# Patient Record
Sex: Female | Born: 1970 | Race: Black or African American | Hispanic: No | Marital: Single | Smoking: Never smoker
Health system: Southern US, Community
[De-identification: ages and names within clinical notes are randomized; demographics above are authoritative.]

## PROBLEM LIST (undated history)

## (undated) DIAGNOSIS — R519 Headache, unspecified: Secondary | ICD-10-CM

## (undated) DIAGNOSIS — D649 Anemia, unspecified: Secondary | ICD-10-CM

## (undated) DIAGNOSIS — E119 Type 2 diabetes mellitus without complications: Secondary | ICD-10-CM

## (undated) DIAGNOSIS — D631 Anemia in chronic kidney disease: Secondary | ICD-10-CM

## (undated) DIAGNOSIS — E785 Hyperlipidemia, unspecified: Secondary | ICD-10-CM

## (undated) DIAGNOSIS — N189 Chronic kidney disease, unspecified: Secondary | ICD-10-CM

## (undated) DIAGNOSIS — Z5189 Encounter for other specified aftercare: Secondary | ICD-10-CM

## (undated) DIAGNOSIS — I10 Essential (primary) hypertension: Secondary | ICD-10-CM

## (undated) DIAGNOSIS — K219 Gastro-esophageal reflux disease without esophagitis: Secondary | ICD-10-CM

## (undated) DIAGNOSIS — M542 Cervicalgia: Secondary | ICD-10-CM

## (undated) DIAGNOSIS — N939 Abnormal uterine and vaginal bleeding, unspecified: Secondary | ICD-10-CM

## (undated) DIAGNOSIS — R51 Headache: Secondary | ICD-10-CM

## (undated) HISTORY — DX: Headache, unspecified: R51.9

## (undated) HISTORY — PX: OTHER SURGICAL HISTORY: SHX169

## (undated) HISTORY — PX: HERNIA REPAIR: SHX51

## (undated) HISTORY — PX: APPENDECTOMY: SHX54

## (undated) HISTORY — DX: Encounter for other specified aftercare: Z51.89

## (undated) HISTORY — DX: Essential (primary) hypertension: I10

## (undated) HISTORY — DX: Headache: R51

## (undated) HISTORY — DX: Type 2 diabetes mellitus without complications: E11.9

## (undated) HISTORY — DX: Hyperlipidemia, unspecified: E78.5

## (undated) HISTORY — DX: Gastro-esophageal reflux disease without esophagitis: K21.9

## (undated) HISTORY — PX: CARDIAC CATHETERIZATION: SHX172

## (undated) HISTORY — DX: Cervicalgia: M54.2

## (undated) HISTORY — PX: CHOLECYSTECTOMY: SHX55

---

## 1998-03-20 ENCOUNTER — Encounter: Admission: RE | Admit: 1998-03-20 | Discharge: 1998-06-18 | Payer: Self-pay | Admitting: *Deleted

## 1998-05-07 ENCOUNTER — Emergency Department (HOSPITAL_COMMUNITY): Admission: EM | Admit: 1998-05-07 | Discharge: 1998-05-07 | Payer: Self-pay | Admitting: Emergency Medicine

## 1998-05-18 ENCOUNTER — Encounter: Payer: Self-pay | Admitting: Emergency Medicine

## 1998-05-18 ENCOUNTER — Emergency Department (HOSPITAL_COMMUNITY): Admission: EM | Admit: 1998-05-18 | Discharge: 1998-05-18 | Payer: Self-pay | Admitting: Emergency Medicine

## 1998-12-24 ENCOUNTER — Emergency Department (HOSPITAL_COMMUNITY): Admission: EM | Admit: 1998-12-24 | Discharge: 1998-12-24 | Payer: Self-pay | Admitting: Emergency Medicine

## 1999-05-04 ENCOUNTER — Emergency Department (HOSPITAL_COMMUNITY): Admission: EM | Admit: 1999-05-04 | Discharge: 1999-05-04 | Payer: Self-pay | Admitting: *Deleted

## 1999-05-26 ENCOUNTER — Emergency Department (HOSPITAL_COMMUNITY): Admission: EM | Admit: 1999-05-26 | Discharge: 1999-05-26 | Payer: BC Managed Care – PPO | Admitting: Emergency Medicine

## 1999-10-12 ENCOUNTER — Encounter: Admission: RE | Admit: 1999-10-12 | Discharge: 2000-01-10 | Payer: Self-pay

## 1999-10-13 ENCOUNTER — Other Ambulatory Visit: Admission: RE | Admit: 1999-10-13 | Discharge: 1999-10-13 | Payer: Self-pay | Admitting: Family Medicine

## 2000-07-27 ENCOUNTER — Encounter: Admission: RE | Admit: 2000-07-27 | Discharge: 2000-07-27 | Payer: Self-pay | Admitting: Family Medicine

## 2000-07-27 ENCOUNTER — Encounter: Payer: Self-pay | Admitting: Family Medicine

## 2001-04-17 ENCOUNTER — Other Ambulatory Visit: Admission: RE | Admit: 2001-04-17 | Discharge: 2001-04-17 | Payer: Self-pay | Admitting: Family Medicine

## 2001-06-28 ENCOUNTER — Emergency Department (HOSPITAL_COMMUNITY): Admission: EM | Admit: 2001-06-28 | Discharge: 2001-06-28 | Payer: Self-pay | Admitting: Emergency Medicine

## 2001-06-28 ENCOUNTER — Observation Stay (HOSPITAL_COMMUNITY): Admission: EM | Admit: 2001-06-28 | Discharge: 2001-06-29 | Payer: Self-pay | Admitting: Family Medicine

## 2001-06-28 ENCOUNTER — Encounter: Payer: Self-pay | Admitting: Emergency Medicine

## 2002-07-04 ENCOUNTER — Other Ambulatory Visit: Admission: RE | Admit: 2002-07-04 | Discharge: 2002-07-04 | Payer: Self-pay | Admitting: Family Medicine

## 2003-05-03 ENCOUNTER — Inpatient Hospital Stay (HOSPITAL_COMMUNITY): Admission: AD | Admit: 2003-05-03 | Discharge: 2003-05-03 | Payer: Self-pay | Admitting: Family Medicine

## 2003-05-18 ENCOUNTER — Emergency Department (HOSPITAL_COMMUNITY): Admission: EM | Admit: 2003-05-18 | Discharge: 2003-05-18 | Payer: Self-pay | Admitting: Emergency Medicine

## 2003-06-05 ENCOUNTER — Encounter: Admission: RE | Admit: 2003-06-05 | Discharge: 2003-06-05 | Payer: Self-pay | Admitting: Obstetrics and Gynecology

## 2004-01-16 ENCOUNTER — Emergency Department (HOSPITAL_COMMUNITY): Admission: EM | Admit: 2004-01-16 | Discharge: 2004-01-17 | Payer: Self-pay | Admitting: Emergency Medicine

## 2004-05-20 ENCOUNTER — Ambulatory Visit: Payer: Self-pay | Admitting: Family Medicine

## 2004-09-29 ENCOUNTER — Emergency Department (HOSPITAL_COMMUNITY): Admission: EM | Admit: 2004-09-29 | Discharge: 2004-09-29 | Payer: Self-pay | Admitting: Emergency Medicine

## 2004-10-02 ENCOUNTER — Inpatient Hospital Stay (HOSPITAL_COMMUNITY): Admission: AD | Admit: 2004-10-02 | Discharge: 2004-10-02 | Payer: Self-pay | Admitting: Obstetrics and Gynecology

## 2004-10-13 ENCOUNTER — Other Ambulatory Visit: Admission: RE | Admit: 2004-10-13 | Discharge: 2004-10-13 | Payer: Self-pay | Admitting: Family Medicine

## 2005-03-02 ENCOUNTER — Emergency Department (HOSPITAL_COMMUNITY): Admission: EM | Admit: 2005-03-02 | Discharge: 2005-03-02 | Payer: Self-pay | Admitting: Emergency Medicine

## 2005-05-02 ENCOUNTER — Emergency Department (HOSPITAL_COMMUNITY): Admission: EM | Admit: 2005-05-02 | Discharge: 2005-05-02 | Payer: Self-pay | Admitting: Emergency Medicine

## 2005-06-23 ENCOUNTER — Ambulatory Visit: Payer: Self-pay | Admitting: Family Medicine

## 2005-06-24 ENCOUNTER — Ambulatory Visit (HOSPITAL_COMMUNITY): Admission: RE | Admit: 2005-06-24 | Discharge: 2005-06-24 | Payer: Self-pay | Admitting: *Deleted

## 2005-07-19 ENCOUNTER — Encounter (INDEPENDENT_AMBULATORY_CARE_PROVIDER_SITE_OTHER): Payer: Self-pay | Admitting: Specialist

## 2005-07-19 ENCOUNTER — Other Ambulatory Visit: Admission: RE | Admit: 2005-07-19 | Discharge: 2005-07-19 | Payer: Self-pay | Admitting: Obstetrics & Gynecology

## 2005-07-19 ENCOUNTER — Ambulatory Visit: Payer: Self-pay | Admitting: Family Medicine

## 2005-08-17 ENCOUNTER — Ambulatory Visit: Payer: Self-pay | Admitting: Family Medicine

## 2006-03-16 ENCOUNTER — Other Ambulatory Visit: Admission: RE | Admit: 2006-03-16 | Discharge: 2006-03-16 | Payer: Self-pay | Admitting: Family Medicine

## 2006-09-14 ENCOUNTER — Emergency Department (HOSPITAL_COMMUNITY): Admission: EM | Admit: 2006-09-14 | Discharge: 2006-09-14 | Payer: Self-pay | Admitting: Emergency Medicine

## 2006-11-07 ENCOUNTER — Inpatient Hospital Stay (HOSPITAL_COMMUNITY): Admission: AD | Admit: 2006-11-07 | Discharge: 2006-11-07 | Payer: Self-pay | Admitting: Obstetrics & Gynecology

## 2006-11-10 ENCOUNTER — Encounter: Admission: RE | Admit: 2006-11-10 | Discharge: 2006-11-10 | Payer: Self-pay | Admitting: Family Medicine

## 2006-11-23 ENCOUNTER — Encounter: Admission: RE | Admit: 2006-11-23 | Discharge: 2006-11-23 | Payer: Self-pay | Admitting: Family Medicine

## 2007-07-29 ENCOUNTER — Emergency Department (HOSPITAL_COMMUNITY): Admission: EM | Admit: 2007-07-29 | Discharge: 2007-07-29 | Payer: Self-pay | Admitting: Emergency Medicine

## 2007-08-03 ENCOUNTER — Encounter: Admission: RE | Admit: 2007-08-03 | Discharge: 2007-08-03 | Payer: Self-pay | Admitting: Family Medicine

## 2008-02-29 ENCOUNTER — Emergency Department (HOSPITAL_BASED_OUTPATIENT_CLINIC_OR_DEPARTMENT_OTHER): Admission: EM | Admit: 2008-02-29 | Discharge: 2008-03-01 | Payer: Self-pay | Admitting: Emergency Medicine

## 2008-03-18 ENCOUNTER — Other Ambulatory Visit: Admission: RE | Admit: 2008-03-18 | Discharge: 2008-03-18 | Payer: Self-pay | Admitting: Family Medicine

## 2008-04-14 ENCOUNTER — Emergency Department (HOSPITAL_COMMUNITY): Admission: EM | Admit: 2008-04-14 | Discharge: 2008-04-14 | Payer: Self-pay | Admitting: Family Medicine

## 2008-05-18 ENCOUNTER — Ambulatory Visit: Payer: Self-pay | Admitting: Interventional Radiology

## 2008-05-18 ENCOUNTER — Emergency Department (HOSPITAL_BASED_OUTPATIENT_CLINIC_OR_DEPARTMENT_OTHER): Admission: EM | Admit: 2008-05-18 | Discharge: 2008-05-18 | Payer: Self-pay | Admitting: Emergency Medicine

## 2008-09-18 ENCOUNTER — Ambulatory Visit: Payer: Self-pay | Admitting: Diagnostic Radiology

## 2008-09-18 ENCOUNTER — Emergency Department (HOSPITAL_BASED_OUTPATIENT_CLINIC_OR_DEPARTMENT_OTHER): Admission: EM | Admit: 2008-09-18 | Discharge: 2008-09-18 | Payer: Self-pay | Admitting: Emergency Medicine

## 2009-04-20 ENCOUNTER — Emergency Department (HOSPITAL_BASED_OUTPATIENT_CLINIC_OR_DEPARTMENT_OTHER): Admission: EM | Admit: 2009-04-20 | Discharge: 2009-04-20 | Payer: Self-pay | Admitting: Emergency Medicine

## 2009-08-04 ENCOUNTER — Emergency Department (HOSPITAL_BASED_OUTPATIENT_CLINIC_OR_DEPARTMENT_OTHER): Admission: EM | Admit: 2009-08-04 | Discharge: 2009-08-05 | Payer: Self-pay | Admitting: Emergency Medicine

## 2009-08-05 ENCOUNTER — Ambulatory Visit: Payer: Self-pay | Admitting: Diagnostic Radiology

## 2010-03-12 ENCOUNTER — Emergency Department (HOSPITAL_COMMUNITY): Admission: EM | Admit: 2010-03-12 | Discharge: 2010-03-12 | Payer: Self-pay | Admitting: Emergency Medicine

## 2010-03-23 ENCOUNTER — Encounter: Admission: RE | Admit: 2010-03-23 | Discharge: 2010-03-23 | Payer: Self-pay | Admitting: Emergency Medicine

## 2010-04-08 ENCOUNTER — Emergency Department (HOSPITAL_BASED_OUTPATIENT_CLINIC_OR_DEPARTMENT_OTHER): Admission: EM | Admit: 2010-04-08 | Discharge: 2010-04-09 | Payer: Self-pay | Admitting: Emergency Medicine

## 2010-04-10 ENCOUNTER — Inpatient Hospital Stay (HOSPITAL_COMMUNITY): Admission: EM | Admit: 2010-04-10 | Discharge: 2010-04-14 | Payer: Self-pay | Admitting: Family Medicine

## 2010-04-10 ENCOUNTER — Encounter: Payer: Self-pay | Admitting: Emergency Medicine

## 2010-04-10 ENCOUNTER — Ambulatory Visit: Payer: Self-pay | Admitting: Diagnostic Radiology

## 2010-04-26 ENCOUNTER — Encounter: Payer: Self-pay | Admitting: Family Medicine

## 2010-04-28 LAB — CONVERTED CEMR LAB: Pap Smear: NORMAL

## 2010-05-03 ENCOUNTER — Ambulatory Visit: Payer: Self-pay | Admitting: Family Medicine

## 2010-05-03 DIAGNOSIS — K869 Disease of pancreas, unspecified: Secondary | ICD-10-CM | POA: Insufficient documentation

## 2010-05-03 DIAGNOSIS — Z90411 Acquired partial absence of pancreas: Secondary | ICD-10-CM | POA: Insufficient documentation

## 2010-05-03 DIAGNOSIS — F329 Major depressive disorder, single episode, unspecified: Secondary | ICD-10-CM

## 2010-05-03 DIAGNOSIS — F3289 Other specified depressive episodes: Secondary | ICD-10-CM | POA: Insufficient documentation

## 2010-05-03 DIAGNOSIS — Q8909 Congenital malformations of spleen: Secondary | ICD-10-CM | POA: Insufficient documentation

## 2010-05-10 ENCOUNTER — Telehealth (INDEPENDENT_AMBULATORY_CARE_PROVIDER_SITE_OTHER): Payer: Self-pay | Admitting: *Deleted

## 2010-05-13 ENCOUNTER — Encounter: Payer: Self-pay | Admitting: Family Medicine

## 2010-05-18 ENCOUNTER — Telehealth (INDEPENDENT_AMBULATORY_CARE_PROVIDER_SITE_OTHER): Payer: Self-pay | Admitting: *Deleted

## 2010-06-02 ENCOUNTER — Ambulatory Visit: Payer: Self-pay | Admitting: Family Medicine

## 2010-06-05 ENCOUNTER — Encounter: Payer: Self-pay | Admitting: Family Medicine

## 2010-06-23 ENCOUNTER — Ambulatory Visit
Admission: RE | Admit: 2010-06-23 | Discharge: 2010-06-23 | Payer: Self-pay | Source: Home / Self Care | Attending: Family Medicine | Admitting: Family Medicine

## 2010-06-23 DIAGNOSIS — J019 Acute sinusitis, unspecified: Secondary | ICD-10-CM | POA: Insufficient documentation

## 2010-07-08 ENCOUNTER — Encounter: Payer: Self-pay | Admitting: Family Medicine

## 2010-07-08 NOTE — Progress Notes (Signed)
Summary: novolog refill   Phone Note Refill Request Message from:  Patient  Refills Requested: Medication #1:  NOVOLOG FLEXPEN 100 UNIT/ML SOLN sliding scale w/ meals  Medication #2:  BD ULTRA FINE SHORT 31 GX 5/BD [facility] [address]  Initial call taken by: Arbie [place],  May 10, 2010 4:08 PM    New/Updated Medications: NOVOFINE 32G X 6 MM MISC (INSULIN PEN NEEDLE)  Prescriptions: NOVOLOG FLEXPEN 100 UNIT/ML SOLN (INSULIN ASPART) sliding scale w/ meals  #64month x 2   Entered by:   Malachi Bonds CMA   Authorized by:   Annye Asa MD   Signed by:   Malachi Bonds CMA on 05/10/2010   Method used:   Electronically to        [id number] (retail)       [address]       [place], [place]  [address]       Ph: [phone] or [phone]       Fax: [phone]   RxID:   [id number]

## 2010-07-08 NOTE — Procedures (Signed)
Summary: [facility]   Imported By: Edmonia James 05/08/2010 10:07:03  _____________________________________________________________________  External Attachment:    Type:   Image     Comment:   External Document

## 2010-07-08 NOTE — Assessment & Plan Note (Signed)
Summary: ONE MONTH OV/.PH   Vital Signs:  Patient profile:   40 year old female Height:      65.50 inches Weight:      184 pounds BMI:     30.26 Pulse rate:   102 / minute BP sitting:   130 / 78  (left arm)  Vitals Entered By: Malachi Bonds CMA (June 02, 2010 8:45 AM) CC: F/U    History of Present Illness: 40 yo woman here today for f/u on recent citalopram start.  pt reports she is 'much better'.  taking meds at night due to fatigue.  reports she is less tearful, anxious, irritable.  went back to work on Monday, 'i feel great about that'.  pancreas problems- Dr Hurman Horn ([facility]) left the practice and they are unable to tell pt who she will be seeing.  she is getting frustrated w/ this process.  has seen [facility] in the past for surgery, is ok w/ being referred there for f/u.  Current Medications (verified): 1)  Levemir Flexpen 100 Unit/ml Soln (Insulin Detemir) .... 35 Units Two Times A Day 2)  Novolog Flexpen 100 Unit/ml Soln (Insulin Aspart) .... Sliding Scale W/ Meals 3)  Hydrochlorothiazide 25 Mg Tabs (Hydrochlorothiazide) .... Take One Tablet Daily 4)  Naproxen Sodium 550 Mg Tabs (Naproxen Sodium) .... Take One Tablet Two Times A Day As Needed 5)  Percocet 5-325 Mg Tabs (Oxycodone-Acetaminophen) .... Take One Tablet As Needed 6)  Vitamin D (Ergocalciferol) 50000 Unit Caps (Ergocalciferol) .... Take One Capsule Weekly 7)  Freestyle Lancets  Misc (Lancets) .... Test As Directed 8)  Freestyle Test  Strp (Glucose Blood) .... Test As Directed 9)  Citalopram Hydrobromide 20 Mg Tabs (Citalopram Hydrobromide) .... Take One Tablet By Mouth Daily 10)  Novofine 32g X 6 Mm Misc (Insulin Pen Needle)  Allergies (verified): 1)  ! Morphine  Review of Systems      See HPI  Physical Exam  General:  Well-developed,well-nourished,in no acute distress; alert,appropriate and cooperative throughout examination Psych:  Cognition and judgment appear intact. Alert and cooperative with  normal attention span and concentration. No apparent delusions, illusions, hallucinations.  much brighter today- smiling and laughing   Impression & Recommendations:  Problem # 1:  DEPRESSIVE DISORDER (ICD-311) Assessment Improved pt's sxs much improved since starting SSRI.  continue at current dose. Her updated medication list for this problem includes:    Citalopram Hydrobromide 20 Mg Tabs (Citalopram hydrobromide) .Marland Kitchen... Take one tablet by mouth daily  Problem # 2:  PANCREATIC DISORDER (ICD-577.9) Assessment: Unchanged  pt unhappy w/ current situation at [employer].  will refer to [place].  Orders: Gastroenterology Referral (GI)  Complete Medication List: 1)  Levemir Flexpen 100 Unit/ml Soln (Insulin detemir) .... 35 units two times a day 2)  Novolog Flexpen 100 Unit/ml Soln (Insulin aspart) .... Sliding scale w/ meals 3)  Hydrochlorothiazide 25 Mg Tabs (Hydrochlorothiazide) .... Take one tablet daily 4)  Naproxen Sodium 550 Mg Tabs (Naproxen sodium) .... Take one tablet two times a day as needed 5)  Percocet 5-325 Mg Tabs (Oxycodone-acetaminophen) .... Take one tablet as needed 6)  Vitamin D (ergocalciferol) 50000 Unit Caps (Ergocalciferol) .... Take one capsule weekly 7)  Freestyle Lancets Misc (Lancets) .... Test as directed 8)  Freestyle Test Strp (Glucose blood) .... Test as directed 9)  Citalopram Hydrobromide 20 Mg Tabs (Citalopram hydrobromide) .... Take one tablet by mouth daily 10)  Novofine 32g X 6 Mm Misc (Insulin pen needle)  Patient Instructions: 1)  I'm  so glad you are feeling better! 2)  Continue the Citalopram 3)  We'll refer you to the GI doctors at [facility] 4)  Call with any questions or concerns 5)  Happy New Year!   Orders Added: 1)  Gastroenterology Referral [GI] 2)  Est. Patient Level III [id number]

## 2010-07-08 NOTE — Assessment & Plan Note (Signed)
Summary: new pt to estab///sph   Vital Signs:  Patient profile:   40 year old female Height:      65.50 inches Weight:      184 pounds BMI:     30.26 Pulse rate:   92 / minute BP sitting:   116 / 78  (right arm)  Vitals Entered By: Malachi Bonds CMA (May 03, 2010 10:54 AM) CC: NEW EST- refill on meds and discuss ultrasound   History of Present Illness: 40 yo woman here today to establish care.  GYN- [facility].  1) pancreatitis- was at [facility] for 6 months in 1998 and then transferred to [facility] and stayed for 18 months.  had pseudocysts throughout abd.  had to remove spleen, appendix, tail of pancreas (causing DM).  now again having difficulty w/ pancreas- trouble tolerating solid foods beginning in Oct.  seeing Dr Rolm Bookbinder (GI) at [facility].  has dilated common bile duct on pancreatic US and upcoming ERCP.  now Dr Rolm Bookbinder is leaving practice, pt needs new GI doc.  has been out of work for last month during work up.  2) DM- due to pancreatic infxn, removal (see above).  was seeing [facility]- Dr Buddy Duty- and after missing appt was dropped from practice.  would like new Endo.  3) Depression- losing motivation, focus.  having difficulty sleeping.  crying frequently.  more difficult since being out of work.  overwhelmed financially and w/ medical issues.  Preventive Screening-Counseling & Management  Alcohol-Tobacco     Alcohol drinks/day: 0     Smoking Status: never  Caffeine-Diet-Exercise     Does Patient Exercise: no      Sexual History:  currently monogamous.        Drug Use:  never.    Current Medications (verified): 1)  Levemir Flexpen 100 Unit/ml Soln (Insulin Detemir) .... 35 Units Two Times A Day 2)  Novolog Flexpen 100 Unit/ml Soln (Insulin Aspart) .... Sliding Scale W/ Meals 3)  Hydrochlorothiazide 25 Mg Tabs (Hydrochlorothiazide) .... Take One Tablet Daily 4)  Naproxen Sodium 550 Mg Tabs (Naproxen Sodium) .... Take One Tablet Two Times A Day As Needed 5)  Percocet  5-325 Mg Tabs (Oxycodone-Acetaminophen) .... Take One Tablet As Needed 6)  Vitamin D (Ergocalciferol) 50000 Unit Caps (Ergocalciferol) .... Take One Capsule Weekly 7)  Freestyle Lancets  Misc (Lancets) .... Test As Directed 8)  Freestyle Test  Strp (Glucose Blood) .... Test As Directed  Allergies (verified): 1)  ! 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 No apparent alopecia or balding. Eyes:  PERRL, EOMI Neck:  No deformities, masses, or tenderness noted. Lungs:  Normal respiratory effort, chest expands symmetrically. Lungs are clear to auscultation, no crackles or wheezes. Heart:  Normal rate and regular rhythm. S1 and S2 normal without gallop, murmur, click, rub or other extra sounds. Abdomen:  soft,  mild epigastric tenderness, no rebound or guarding.  + BS Pulses:  +2 carotid, radial, DP Extremities:  no C/C/E Neurologic:  alert & oriented X3, cranial nerves II-XII intact, and gait normal.   Skin:  turgor normal and color normal.   Cervical Nodes:  No lymphadenopathy noted Psych:  Cognition and judgment appear intact. Alert and cooperative with normal attention span and  concentration. No apparent delusions, illusions, hallucinations   Impression & Recommendations:  Problem # 1:  PANCREATIC DISORDER (ICD-577.9) Assessment New  currently following w/ GI at [facility] but this doctor is leaving practice mid-november.  pt is in need of new GI MD.  has upcoming MRCP.  will follow and assist as able.  Orders: Gastroenterology Referral (GI)  Problem # 2:  DIABETES MELLITUS, TYPE II (ICD-250.00) Assessment: New  was dismissed from previous practice for missing 1 appt.  would like new endo to help w/ dz management.  will refer. Her updated medication list for this problem includes:    Levemir Flexpen 100 Unit/ml Soln (Insulin detemir) .Marland KitchenMarland KitchenMarland KitchenMarland Kitchen 35 units two times a day    Novolog Flexpen 100 Unit/ml Soln (Insulin aspart) ..... 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  Problem # 4:  ASPLENIA (ICD-759.0) Assessment: New UTD on vaccines.  Problem # 5:  DEPRESSIVE DISORDER (ICD-311) Assessment: New given pt's current situation will start meds to assist w/ mood.  pt very bright and upbeat in office but admits this is a front and she's very upset by all that is going on and 'it's getting harder'.  will follow closely. Her updated medication list for this problem includes:    Citalopram Hydrobromide 20 Mg Tabs (Citalopram hydrobromide) .Marland Kitchen... Take one tablet by mouth daily  Complete Medication List: 1)  Levemir Flexpen 100 Unit/ml Soln (Insulin detemir) .... 35 units two times a day 2)  Novolog Flexpen 100 Unit/ml Soln (Insulin aspart) .... Sliding scale w/ meals 3)  Hydrochlorothiazide 25 Mg Tabs (Hydrochlorothiazide) .... Take one tablet daily 4)  Naproxen Sodium 550 Mg Tabs (Naproxen sodium) .... Take one tablet two times a day as needed 5)  Percocet 5-325 Mg Tabs (Oxycodone-acetaminophen) .... Take one tablet as needed 6)   Vitamin D (ergocalciferol) 50000 Unit Caps (Ergocalciferol) .... Take one capsule weekly 7)  Freestyle Lancets Misc (Lancets) .... Test as directed 8)  Freestyle Test Strp (Glucose blood) .... Test as directed 9)  Citalopram Hydrobromide 20 Mg Tabs (Citalopram hydrobromide) .... Take one tablet by mouth daily  Patient Instructions: 1)  Please follow up in 1 month to discuss your mood 2)  Start the Citalopram daily 3)  We'll call you with your GI and Endo appts 4)  No changes to your meds at this time 5)  Call with any questions or concerns 6)  Hang in there!!! 7)  Welcome!  We're glad to have you!!! Prescriptions: CITALOPRAM HYDROBROMIDE 20 MG TABS (CITALOPRAM HYDROBROMIDE) take one tablet by mouth daily  #30 x 3   Entered and Authorized by:   Annye Asa MD   Signed by:   Annye Asa MD on 05/03/2010   Method used:   Electronically to        [facility] (retail)       [phone]       [place], [place]  [address]  Ph: [phone]       Fax: [phone]   RxID:   [id number]    Orders Added: 1)  New Patient Level III [id number] 2)  Endocrinology Referral [Endocrine] 3)  Gastroenterology Referral [GI]     Preventive Care Screening  Pap Smear:    Date:  04/28/2010    Results:  normal   Last Pneumovax:    Date:  03/14/2010    Results:  historical

## 2010-07-08 NOTE — Assessment & Plan Note (Signed)
Summary: sinus issue, ear stopped up//fd   Vital Signs:  Patient profile:   40 year old female Weight:      186 pounds BMI:     30.59 Temp:     97.5 degrees F oral BP sitting:   134 / 80  (left arm)  Vitals Entered By: Malachi Bonds CMA (June 23, 2010 4:32 PM) CC: sinus congestion and pressure along w/ ear pain    History of Present Illness: 40 yo woman here today for ? sinus infxn.  sxs started 3 days ago.  + nasal congestion, HA, ear pain, facial pressure.  no fever.  + cough.  + sick contacts.  Current Medications (verified): 1)  Levemir Flexpen 100 Unit/ml Soln (Insulin Detemir) .... 35 Units Two Times A Day 2)  Novolog Flexpen 100 Unit/ml Soln (Insulin Aspart) .... Sliding Scale W/ Meals 3)  Hydrochlorothiazide 25 Mg Tabs (Hydrochlorothiazide) .... Take One Tablet Daily 4)  Naproxen Sodium 550 Mg Tabs (Naproxen Sodium) .... Take One Tablet Two Times A Day As Needed 5)  Percocet 5-325 Mg Tabs (Oxycodone-Acetaminophen) .... Take One Tablet As Needed 6)  Vitamin D (Ergocalciferol) 50000 Unit Caps (Ergocalciferol) .... Take One Capsule Weekly 7)  Freestyle Lancets  Misc (Lancets) .... Test As Directed 8)  Freestyle Test  Strp (Glucose Blood) .... Test As Directed 9)  Citalopram Hydrobromide 20 Mg Tabs (Citalopram Hydrobromide) .... Take One Tablet By Mouth Daily 10)  Novofine 32g X 6 Mm Misc (Insulin Pen Needle)  Allergies (verified): 1)  ! Morphine  Review of Systems      See HPI  Physical Exam  General:  Well-developed,well-nourished,in no acute distress; alert,appropriate and cooperative throughout examination Head:  Normocephalic and atraumatic without obvious abnormalities. No apparent alopecia or balding.  + TTP over frontal and maxillary sinuses Eyes:  no injxn or inflammation Ears:  L TM retracted, R TM WNL Nose:  + congestion Mouth:  Oral mucosa and oropharynx without lesions or exudates.  Teeth in good repair. Neck:  No deformities, masses, or tenderness  noted. Lungs:  Normal respiratory effort, chest expands symmetrically. Lungs are clear to auscultation, no crackles or wheezes. Heart:  Normal rate and regular rhythm. S1 and S2 normal without gallop, murmur, click, rub or other extra sounds.   Impression & Recommendations:  Problem # 1:  SINUSITIS - ACUTE-NOS ([id number]) Assessment New PE consistent w/ infxn.  start amox.  reviewed supportive care and red flags that should prompt return.  Pt expresses understanding and is in agreement w/ this plan. Her updated medication list for this problem includes:    Amoxicillin 500 Mg Tabs (Amoxicillin) .Marland Kitchen... 2 tabs by mouth two times a day x 10 days.  take w/ food.  Complete Medication List: 1)  Levemir Flexpen 100 Unit/ml Soln (Insulin detemir) .... 35 units two times a day 2)  Novolog Flexpen 100 Unit/ml Soln (Insulin aspart) .... Sliding scale w/ meals 3)  Hydrochlorothiazide 25 Mg Tabs (Hydrochlorothiazide) .... Take one tablet daily 4)  Naproxen Sodium 550 Mg Tabs (Naproxen sodium) .... Take one tablet two times a day as needed 5)  Percocet 5-325 Mg Tabs (Oxycodone-acetaminophen) .... Take one tablet as needed 6)  Vitamin D (ergocalciferol) 50000 Unit Caps (Ergocalciferol) .... Take one capsule weekly 7)  Freestyle Lancets Misc (Lancets) .... Test as directed 8)  Freestyle Test Strp (Glucose blood) .... Test as directed 9)  Citalopram Hydrobromide 20 Mg Tabs (Citalopram hydrobromide) .... Take one tablet by mouth daily 10)  Novofine 32g X  6 Mm Misc (Insulin pen needle) 11)  Amoxicillin 500 Mg Tabs (Amoxicillin) .... 2 tabs by mouth two times a day x 10 days.  take w/ food.  Patient Instructions: 1)  You have a sinus infection- take the amoxicillin as directed 2)  Eat a yogurt daily to help prevent yeast 3)  Mucinex will thin your congestion and help you drain 4)  Tylenol/ibuprofen as needed for pain or fever 5)  Drink plenty of fluids 6)  Hang in there!!! Prescriptions: AMOXICILLIN 500  MG TABS (AMOXICILLIN) 2 tabs by mouth two times a day x 10 days.  take w/ food.  #40 x 0   Entered and Authorized by:   Annye Asa MD   Signed by:   Annye Asa MD on 06/23/2010   Method used:   Electronically to        [id number] (retail)       [address]       [place], [place]  [address]       Ph: [phone] or [phone]       Fax: [phone]   RxID:   [id number]    Orders Added: 1)  Est. Patient Level III [id number]

## 2010-07-08 NOTE — Consult Note (Signed)
Summary: [facility]   Imported By: Edmonia James 05/22/2010 08:39:35  _____________________________________________________________________  External Attachment:    Type:   Image     Comment:   External Document

## 2010-07-08 NOTE — Progress Notes (Signed)
Summary: BD ULTRA FINE SHORT NEEDLES REFILL  Phone Note Refill Request Message from:  Pharmacy on May 18, 2010 1:03 PM  Refills Requested: Medication #1:  BD ULTRA FINE SHORT   Notes: TO USE WITH Shannon Caldwell message from [id number] [place] [address] , phone# --[phone],  fax=[phone]   did not give qty of needles---see phone note dated 12/5--was it sent on 12/5??  Initial call taken by: Berneta Sages,  May 18, 2010 1:15 PM    Prescriptions: NOVOFINE 32G X 6 MM MISC (INSULIN PEN NEEDLE)   #1 month x 2   Entered by:   Malachi Bonds CMA   Authorized by:   Annye Asa MD   Signed by:   Malachi Bonds CMA on 05/18/2010   Method used:   Printed then faxed to ...       [facility] [phone]* (retail)       [address]       [place], [place]  [address]       Ph: [phone]       Fax: [phone]   RxID:   [id number]

## 2010-07-20 ENCOUNTER — Telehealth: Payer: Self-pay | Admitting: Family Medicine

## 2010-07-21 ENCOUNTER — Telehealth (INDEPENDENT_AMBULATORY_CARE_PROVIDER_SITE_OTHER): Payer: Self-pay | Admitting: *Deleted

## 2010-07-28 NOTE — Letter (Signed)
Summary: [facility]   Imported By: Laural Benes 07/21/2010 10:53:49  _____________________________________________________________________  External Attachment:    Type:   Image     Comment:   External Document

## 2010-07-28 NOTE — Progress Notes (Signed)
Summary: Hydrochlorothiazide refill  Phone Note Refill Request Message from:  Fax from Pharmacy on July 21, 2010 11:06 AM  Refills Requested: Medication #1:  HYDROCHLOROTHIAZIDE 25 MG TABS take one tablet daily   Last Ref[facility]: [id number] [place], [place], [place]   [phone],  fax = [phone]   qty = 30  Next Appointment Scheduled: none Initial call taken by: Berneta Sages,  July 21, 2010 11:07 AM    Prescriptions: HYDROCHLOROTHIAZIDE 25 MG TABS (HYDROCHLOROTHIAZIDE) take one tablet daily  #30 x 3   Entered by:   Malachi Bonds CMA   Authorized by:   Annye Asa MD   Signed by:   Malachi Bonds CMA on 07/21/2010   Method used:   Electronically to        [id number]* (retail)       [address]       [place], [place]  [address]       Ph: [phone] or [phone]       Fax: [phone]   RxID:   [id number]

## 2010-07-28 NOTE — Progress Notes (Signed)
Summary: still no better  Phone Note Call from Patient Call back at Home Phone [phone]   Caller: Patient Summary of Call: Pt still c/o facial pain/tenderness/swelling on left side of face, dizzy spell, drainage, coughing, headache, bodyaches and sore throat. [facility] Pls advise..........Marland KitchenFelecia Deloach CMA  July 20, 2010 12:10 PM   Follow-up for Phone Call        we saw her on 1/18- this is almost 1 month ago.  did her sxs get better and she get sick for a 2nd time or did she never get better?  it would be unusal for her to feel that bad for an entire month.  please ask her just to clarify but either way can start Avelox 400mg  daily x10 days. Follow-up by: Annye Asa MD,  July 20, 2010 12:17 PM  Additional Follow-up for Phone Call Additional follow up Details #1::        Pt states that symptoms did improve some but just lingered on then came back strong then before. Pt aware Rx sent to pharmacy.Marland KitchenMarland KitchenMarland KitchenFelecia Deloach CMA  July 20, 2010 12:57 PM     Additional Follow-up for Phone Call Additional follow up Details #2::    noted Follow-up by: Annye Asa MD,  July 20, 2010 8:14 PM  New/Updated Medications: AVELOX 400 MG TABS (MOXIFLOXACIN HCL) Take 1 tab daily x10 days Prescriptions: AVELOX 400 MG TABS (MOXIFLOXACIN HCL) Take 1 tab daily x10 days  #10 x 0   Entered by:   Rolla Flatten CMA   Authorized by:   Annye Asa MD   Signed by:   Rolla Flatten CMA on 07/20/2010   Method used:   [facility]ed to ...       [facility] [id number]       [place]       [place], [place]  [address]       Ph: [phone] or [phone]       Fax: [phone]   RxID:   [id number]

## 2010-08-03 NOTE — Letter (Signed)
Summary: [facility]   Imported By: Laural Benes 07/27/2010 14:47:29  _____________________________________________________________________  External Attachment:    Type:   Image     Comment:   External Document

## 2010-08-17 LAB — URINE CULTURE

## 2010-08-17 LAB — CULTURE, BLOOD (ROUTINE X 2)
Culture  Setup Time: 201111051210
Culture: NO GROWTH

## 2010-08-17 LAB — CBC
Hemoglobin: 8.6 g/dL — ABNORMAL LOW (ref 12.0–15.0)
Hemoglobin: 10.6 g/dL — ABNORMAL LOW (ref 12.0–15.0)
MCH: 27.2 pg (ref 26.0–34.0)
MCH: 28.8 pg (ref 26.0–34.0)
MCHC: 32.7 g/dL (ref 30.0–36.0)
MCV: 83.1 fL (ref 78.0–100.0)
MCV: 83.2 fL (ref 78.0–100.0)
Platelets: 270 10*3/uL (ref 150–400)
Platelets: 272 10*3/uL (ref 150–400)
Platelets: 344 10*3/uL (ref 150–400)
RBC: 3.16 MIL/uL — ABNORMAL LOW (ref 3.87–5.11)
RBC: 3.38 MIL/uL — ABNORMAL LOW (ref 3.87–5.11)
RBC: 3.56 MIL/uL — ABNORMAL LOW (ref 3.87–5.11)
RBC: 3.69 MIL/uL — ABNORMAL LOW (ref 3.87–5.11)
RDW: 14 % (ref 11.5–15.5)
WBC: 7.1 10*3/uL (ref 4.0–10.5)
WBC: 8.4 10*3/uL (ref 4.0–10.5)

## 2010-08-17 LAB — DIFFERENTIAL
Eosinophils Absolute: 0 10*3/uL (ref 0.0–0.7)
Lymphs Abs: 1.3 10*3/uL (ref 0.7–4.0)
Monocytes Relative: 6 % (ref 3–12)
Neutrophils Relative %: 83 % — ABNORMAL HIGH (ref 43–77)

## 2010-08-17 LAB — GLUCOSE, CAPILLARY
Glucose-Capillary: 131 mg/dL — ABNORMAL HIGH (ref 70–99)
Glucose-Capillary: 132 mg/dL — ABNORMAL HIGH (ref 70–99)
Glucose-Capillary: 149 mg/dL — ABNORMAL HIGH (ref 70–99)
Glucose-Capillary: 155 mg/dL — ABNORMAL HIGH (ref 70–99)
Glucose-Capillary: 157 mg/dL — ABNORMAL HIGH (ref 70–99)
Glucose-Capillary: 169 mg/dL — ABNORMAL HIGH (ref 70–99)
Glucose-Capillary: 183 mg/dL — ABNORMAL HIGH (ref 70–99)
Glucose-Capillary: 200 mg/dL — ABNORMAL HIGH (ref 70–99)
Glucose-Capillary: 225 mg/dL — ABNORMAL HIGH (ref 70–99)
Glucose-Capillary: 233 mg/dL — ABNORMAL HIGH (ref 70–99)
Glucose-Capillary: 260 mg/dL — ABNORMAL HIGH (ref 70–99)
Glucose-Capillary: 292 mg/dL — ABNORMAL HIGH (ref 70–99)

## 2010-08-17 LAB — URINE MICROSCOPIC-ADD ON

## 2010-08-17 LAB — BASIC METABOLIC PANEL
BUN: 3 mg/dL — ABNORMAL LOW (ref 6–23)
CO2: 29 mEq/L (ref 19–32)
CO2: 27 mEq/L (ref 19–32)
Calcium: 8.6 mg/dL (ref 8.4–10.5)
Calcium: 9.1 mg/dL (ref 8.4–10.5)
Chloride: 103 mEq/L (ref 96–112)
Chloride: 99 mEq/L (ref 96–112)
Chloride: 100 mEq/L (ref 96–112)
Creatinine, Ser: 0.55 mg/dL (ref 0.4–1.2)
Creatinine, Ser: 0.6 mg/dL (ref 0.4–1.2)
GFR calc Af Amer: >60 mL/min (ref >60)
GFR calc Af Amer: >60 mL/min (ref >60)
GFR calc Af Amer: >60 mL/min (ref >60)
GFR calc non Af Amer: >60 mL/min (ref >60)
Glucose, Bld: 258 mg/dL — ABNORMAL HIGH (ref 70–99)
Sodium: 138 mEq/L (ref 135–145)

## 2010-08-17 LAB — URINALYSIS, ROUTINE W REFLEX MICROSCOPIC
Bilirubin Urine: NEGATIVE
Glucose, UA: 100 mg/dL — AB
Specific Gravity, Urine: >1.046 — ABNORMAL HIGH (ref 1.005–1.030)
pH: 6.5 (ref 5.0–8.0)

## 2010-08-17 LAB — CULTURE, ROUTINE-ABSCESS

## 2010-08-17 LAB — FOLATE RBC: RBC Folate: 730 ng/mL — ABNORMAL HIGH (ref 180–600)

## 2010-08-17 LAB — HEMOGLOBIN A1C: Mean Plasma Glucose: 263 mg/dL — ABNORMAL HIGH (ref <117)

## 2010-08-17 LAB — IRON AND TIBC
Saturation Ratios: 3 % — ABNORMAL LOW (ref 20–55)
TIBC: 331 ug/dL (ref 250–470)

## 2010-08-17 LAB — SEDIMENTATION RATE: Sed Rate: 35 mm/hr — ABNORMAL HIGH (ref 0–22)

## 2010-08-18 ENCOUNTER — Encounter: Payer: Self-pay | Admitting: Family Medicine

## 2010-08-18 ENCOUNTER — Ambulatory Visit (INDEPENDENT_AMBULATORY_CARE_PROVIDER_SITE_OTHER): Payer: BC Managed Care – PPO | Admitting: Family Medicine

## 2010-08-18 DIAGNOSIS — L0390 Cellulitis, unspecified: Secondary | ICD-10-CM

## 2010-08-18 DIAGNOSIS — L0291 Cutaneous abscess, unspecified: Secondary | ICD-10-CM | POA: Insufficient documentation

## 2010-08-19 LAB — URINALYSIS, ROUTINE W REFLEX MICROSCOPIC
Glucose, UA: >1000 mg/dL — AB
Nitrite: NEGATIVE
Protein, ur: NEGATIVE mg/dL
Urobilinogen, UA: 1 mg/dL (ref 0.0–1.0)

## 2010-08-19 LAB — COMPREHENSIVE METABOLIC PANEL
ALT: 12 U/L (ref 0–35)
AST: 18 U/L (ref 0–37)
Albumin: 4.3 g/dL (ref 3.5–5.2)
Chloride: 96 mEq/L (ref 96–112)
Creatinine, Ser: 0.66 mg/dL (ref 0.4–1.2)
GFR calc Af Amer: >60 mL/min (ref >60)
Potassium: 3.3 mEq/L — ABNORMAL LOW (ref 3.5–5.1)
Sodium: 133 mEq/L — ABNORMAL LOW (ref 135–145)
Total Bilirubin: 0.9 mg/dL (ref 0.3–1.2)

## 2010-08-19 LAB — DIFFERENTIAL
Basophils Absolute: 0 10*3/uL (ref 0.0–0.1)
Eosinophils Absolute: 0 10*3/uL (ref 0.0–0.7)
Eosinophils Relative: 0 % (ref 0–5)
Lymphocytes Relative: 28 % (ref 12–46)
Monocytes Absolute: 0.4 10*3/uL (ref 0.1–1.0)

## 2010-08-19 LAB — URINE MICROSCOPIC-ADD ON

## 2010-08-19 LAB — GLUCOSE, CAPILLARY: Glucose-Capillary: 318 mg/dL — ABNORMAL HIGH (ref 70–99)

## 2010-08-19 LAB — CBC
Hemoglobin: 11.5 g/dL — ABNORMAL LOW (ref 12.0–15.0)
Platelets: 379 10*3/uL (ref 150–400)
RBC: 4.24 MIL/uL (ref 3.87–5.11)
WBC: 6.4 10*3/uL (ref 4.0–10.5)

## 2010-08-23 ENCOUNTER — Encounter: Payer: Self-pay | Admitting: *Deleted

## 2010-08-23 ENCOUNTER — Telehealth: Payer: Self-pay | Admitting: Family Medicine

## 2010-08-24 NOTE — Assessment & Plan Note (Signed)
Summary: cyst on bottom///sph   Vital Signs:  Patient profile:   40 year old female Height:      65.50 inches (166.37 cm) Weight:      182.50 pounds (82.95 kg) BMI:     30.02 Temp:     98.5 degrees F (36.94 degrees C) oral BP sitting:   140 / 76  (right arm) Cuff size:   regular  Vitals Entered By: Ernestene Mention CMA (August 18, 2010 1:57 PM) CC: Multiple areas of possible cysts./kb Is Patient Diabetic? Yes Pain Assessment Patient in pain? no      Comments Patient notes four area that all appeared at the same time on different parts of her body.   History of Present Illness: 40 yo woman here today for multiple 'bites'.  areas first appeared Sunday morning on R lower buttock, then 1 appeared 'in the crack'.  then R labia and L breast.  all areas are itchy and painful.  breast and labia had pus drain.  no fevers.  areas of skin feel warm to touch.  hx of MRSA.  sugars have been running high- 300 this AM  Current Medications (verified): 1)  Levemir Flexpen 100 Unit/ml Soln (Insulin Detemir) .... 35 Units Two Times A Day 2)  Novolog Flexpen 100 Unit/ml Soln (Insulin Aspart) .... Sliding Scale W/ Meals 3)  Hydrochlorothiazide 25 Mg Tabs (Hydrochlorothiazide) .... Take One Tablet Daily 4)  Naproxen Sodium 550 Mg Tabs (Naproxen Sodium) .... Take One Tablet Two Times A Day As Needed 5)  Vitamin D (Ergocalciferol) 50000 Unit Caps (Ergocalciferol) .... Take One Capsule Weekly 6)  Freestyle Lancets  Misc (Lancets) .... Test As Directed 7)  Freestyle Test  Strp (Glucose Blood) .... Test As Directed 8)  Novofine 32g X 6 Mm Misc (Insulin Pen Needle) 9)  Avelox 400 Mg Tabs (Moxifloxacin Hcl) .... Take 1 Tab Daily X10 Days 10)  Doxycycline Hyclate 100 Mg Caps (Doxycycline Hyclate) .... Take 1 Tab Twice A Day X10 Days.  Take W/ Food. 11)  Ibuprofen 800 Mg Tabs (Ibuprofen) .Marland Kitchen.. 1 Tab By Mouth Three Times A Day As Needed For Pain  Allergies (verified): 1)  ! Morphine  Review of Systems      See  HPI  Physical Exam  General:  Well-developed,well-nourished,in no acute distress; alert,appropriate and cooperative throughout examination Skin:  2 indurated areas on R lower buttock w/ scabbed central pores, no fluctuance or fluid collection.  some erythema, skin is warm to touch.  1 similar area on R upper buttock, L breast lateral to areolar, and R labia majora.   Impression & Recommendations:  Problem # 1:  CELLULITIS/ABSCESS NOS (ICD-682.9) Assessment New given pt's hx of MRSA will start Doxy to treat multiple areas of cellulitis and induration.  reviewed supportive care and red flags that should prompt return.  Pt expresses understanding and is in agreement w/ this plan. Her updated medication list for this problem includes:    Avelox 400 Mg Tabs (Moxifloxacin hcl) .Marland Kitchen... Take 1 tab daily x10 days    Doxycycline Hyclate 100 Mg Caps (Doxycycline hyclate) .Marland Kitchen... Take 1 tab twice a day x10 days.  take w/ food.  Complete Medication List: 1)  Levemir Flexpen 100 Unit/ml Soln (Insulin detemir) .... 35 units two times a day 2)  Novolog Flexpen 100 Unit/ml Soln (Insulin aspart) .... Sliding scale w/ meals 3)  Hydrochlorothiazide 25 Mg Tabs (Hydrochlorothiazide) .... Take one tablet daily 4)  Naproxen Sodium 550 Mg Tabs (Naproxen sodium) .... Take  one tablet two times a day as needed 5)  Vitamin D (ergocalciferol) 50000 Unit Caps (Ergocalciferol) .... Take one capsule weekly 6)  Freestyle Lancets Misc (Lancets) .... Test as directed 7)  Freestyle Test Strp (Glucose blood) .... Test as directed 8)  Novofine 32g X 6 Mm Misc (Insulin pen needle) 9)  Avelox 400 Mg Tabs (Moxifloxacin hcl) .... Take 1 tab daily x10 days 10)  Doxycycline Hyclate 100 Mg Caps (Doxycycline hyclate) .... Take 1 tab twice a day x10 days.  take w/ food. 11)  Ibuprofen 800 Mg Tabs (Ibuprofen) .Marland Kitchen.. 1 tab by mouth three times a day as needed for pain  Patient Instructions: 1)  This appears to be the start of abscesses 2)   Take the Doxy as directed to treat the infection 3)  Hot compresses to the area 4)  If the areas drain- good! 5)  If they are worsening please call or go to Urgent Care to have them drained 6)  Hang in there!!! Prescriptions: IBUPROFEN 800 MG TABS (IBUPROFEN) 1 tab by mouth three times a day as needed for pain  #60 x 0   Entered and Authorized by:   Annye Asa MD   Signed by:   Annye Asa MD on 08/18/2010   Method used:   Electronically to        [facility] [phone]* (retail)       [address]       [place], [place]  [address]       Ph: [phone]       Fax: [phone]   RxID:   [id number] DOXYCYCLINE HYCLATE 100 MG CAPS (DOXYCYCLINE HYCLATE) Take 1 tab twice a day x10 days.  take w/ food.  #20 x 0   Entered and Authorized by:   Annye Asa MD   Signed by:   Annye Asa MD on 08/18/2010   Method used:   Electronically to        [facility] [phone]* (retail)       [address]       [place], [place]  [address]       Ph: [phone]       Fax: [phone]   RxID:   [id number]    Orders Added: 1)  Est. Patient Level III [id number]

## 2010-08-30 ENCOUNTER — Telehealth: Payer: Self-pay | Admitting: Family Medicine

## 2010-08-30 MED ORDER — INSULIN ASPART 100 UNIT/ML ~~LOC~~ SOLN: SUBCUTANEOUS | Status: DC

## 2010-08-30 NOTE — Telephone Encounter (Signed)
Ok to refill and dispense 3 pen per Dr. Birdie Riddle.

## 2010-08-30 NOTE — Telephone Encounter (Signed)
Medication: Novolog Flexpen Firefighter Prescribed: 15.0 ml Sig: Use 20 units subcutaneously with each meal and correction boluses.

## 2010-09-02 NOTE — Letter (Signed)
Summary: Out of Work  [facility] [place]   [place], [place] [address]   Phone: [phone]  Fax: [phone]    August 23, 2010   Employee:  ABBYGAILE LOUT [employer]    To Whom It May Concern:   For Medical reasons, please excuse the above named employee from work for the following dates:  Start:  August 23, 2010  End:  August 24, 2010   If you need additional information, please feel free to contact our office.         Sincerely,        Annye Asa, MD

## 2010-09-02 NOTE — Progress Notes (Signed)
Summary: extend note, pain med   Phone Note Refill Request Call back at Home Phone [phone]   Refills Requested: Medication #1:  IBUPROFEN 800 MG TABS 1 tab by mouth three times a day as needed for pain. Pt request and additional day on work note due to boil draining a lot and painful. Previous note indicated 3-14 until 3-19. Pt also notes that med is not helping with pain since it has come to a head and is draining more. Pt would like to know what else she can do for pain. Pls advise...........Marland KitchenFelecia Deloach CMA  August 23, 2010 11:51 AM    Follow-up for Phone Call        ok to extend work note for 2 more days.  can have tramadol 50mg , 1 tab by mouth q6 as needed for pain.  #30, no refills Follow-up by: Annye Asa MD,  August 23, 2010 12:17 PM  Additional Follow-up for Phone Call Additional follow up Details #1::        Pt aware.......Marland KitchenFelecia Deloach CMA  August 23, 2010 1:26 PM     New/Updated Medications: TRAMADOL HCL 50 MG TABS (TRAMADOL HCL) Take 1 tab by mouth q6 as needed for pain. Prescriptions: TRAMADOL HCL 50 MG TABS (TRAMADOL HCL) Take 1 tab by mouth q6 as needed for pain.  #30 x 0   Entered by:   Rolla Flatten CMA   Authorized by:   Annye Asa MD   Signed by:   Rolla Flatten CMA on 08/23/2010   Method used:   Faxed to ...       [facility] [phone]* (retail)       [address]       [place], [place]  [address]       Ph: [phone]       Fax: [phone]   RxID:   [id number]

## 2010-09-08 LAB — CBC
Hemoglobin: 11.6 g/dL — ABNORMAL LOW (ref 12.0–15.0)
RBC: 3.95 MIL/uL (ref 3.87–5.11)

## 2010-09-08 LAB — POCT CARDIAC MARKERS
CKMB, poc: 1.4 ng/mL (ref 1.0–8.0)
CKMB, poc: 1.8 ng/mL (ref 1.0–8.0)
Myoglobin, poc: 47.4 ng/mL (ref 12–200)
Myoglobin, poc: 52.7 ng/mL (ref 12–200)
Troponin i, poc: <0.05 ng/mL (ref 0.00–0.09)

## 2010-09-08 LAB — BASIC METABOLIC PANEL
CO2: 26 mEq/L (ref 19–32)
Calcium: 8.9 mg/dL (ref 8.4–10.5)
GFR calc Af Amer: >60 mL/min (ref >60)
GFR calc non Af Amer: >60 mL/min (ref >60)
Potassium: 4.2 mEq/L (ref 3.5–5.1)
Sodium: 136 mEq/L (ref 135–145)

## 2010-09-08 LAB — DIFFERENTIAL
Lymphocytes Relative: 32 % (ref 12–46)
Monocytes Absolute: 0.2 10*3/uL (ref 0.1–1.0)
Monocytes Relative: 4 % (ref 3–12)
Neutro Abs: 3 10*3/uL (ref 1.7–7.7)

## 2010-09-15 LAB — CBC
HCT: 40.1 % (ref 36.0–46.0)
Hemoglobin: 13.3 g/dL (ref 12.0–15.0)
MCV: 89.6 fL (ref 78.0–100.0)
RDW: 12.3 % (ref 11.5–15.5)
WBC: 9.5 10*3/uL (ref 4.0–10.5)

## 2010-09-15 LAB — POCT CARDIAC MARKERS: Troponin i, poc: <0.05 ng/mL (ref 0.00–0.09)

## 2010-09-15 LAB — DIFFERENTIAL
Eosinophils Absolute: 0 10*3/uL (ref 0.0–0.7)
Eosinophils Relative: 0 % (ref 0–5)
Lymphocytes Relative: 15 % (ref 12–46)
Lymphs Abs: 1.5 10*3/uL (ref 0.7–4.0)
Monocytes Absolute: 0.3 10*3/uL (ref 0.1–1.0)

## 2010-09-15 LAB — BASIC METABOLIC PANEL
BUN: 6 mg/dL (ref 6–23)
Chloride: 95 mEq/L — ABNORMAL LOW (ref 96–112)
GFR calc non Af Amer: >60 mL/min (ref >60)
Glucose, Bld: 164 mg/dL — ABNORMAL HIGH (ref 70–99)
Potassium: 3.7 mEq/L (ref 3.5–5.1)
Sodium: 136 mEq/L (ref 135–145)

## 2010-10-21 ENCOUNTER — Telehealth: Payer: Self-pay | Admitting: Family Medicine

## 2010-10-21 MED ORDER — FLUCONAZOLE 150 MG PO TABS: 150.0000 mg | ORAL_TABLET | Freq: Once | ORAL | Status: AC

## 2010-10-21 NOTE — Telephone Encounter (Signed)
Left message on vm notifying that RX was sent.

## 2010-10-21 NOTE — Telephone Encounter (Signed)
[place] for Diflucan 150mg  x1.

## 2010-10-21 NOTE — Telephone Encounter (Signed)
Dr Birdie Riddle gave patient antibiotics to treat a boil and warned patient that she would probably get a yeast infection---patient did get yeast infedtion and has been treating it with Monostat but that medication is not taking care of the problem----can Dr Birdie Riddle call [facility] in to [facility] [address], [place] she need to be seen??

## 2010-10-21 NOTE — Telephone Encounter (Signed)
Pls advise.  

## 2010-10-22 NOTE — Group Therapy Note (Signed)
Shannon Caldwell, PENISTON NO.:  [id number]   MEDICAL RECORD NO.:  [id number]          PATIENT TYPE:  WOC   LOCATION:  [facility]                   FACILITY:  [facility]   PHYSICIAN:  Darron Doom, MD        DATE OF BIRTH:  [birth year]   DATE OF SERVICE:                                    CLINIC NOTE   CHIEF COMPLAINT:  Yearly exam and Pap.   HISTORY OF PRESENT ILLNESS:  Patient is a 40 year old gravida 4, para 3-0-1-  3, who returns for her yearly Pap smear.  Her last Pap smear, in this  clinic, was in December of 2005 and was normal.  She has a history of  abnormal periods and heavy bleeding, a family history fibroid uterus.  The  patient has a history of what feels like a fibroid uterus and heavy vaginal  bleeding.  She is also having pain associated with cycles and pelvic pain.   The patient is also complaining of dysuria, polyuria and polydipsia.  She  has a past medical history significant for an ERCP that led to acute  pancreatitis and iatrogenic diabetes.  However, her last physician took her  off all medications and tested her twice last summer, and said she was no  longer diabetic.  The last hemoglobin A1C, I have from this patient, was in  December of 2005 and was 11.2.   The patient is also complaining today of abnormal vaginal discharge with  odor.   The patient is also in a new relationship and would like STD testing today.   PAST MEDICAL HISTORY:  1.  Pneumonia.  2.  ERCP with an acute pancreatitis and iatrogenic diabetes.   PAST SURGICAL HISTORY:  BTL, five exploratory laparotomies.  She has had  removal of a pseudocyst, cholecystectomy, pancreatic debridement and hernia  repair.   OBSTETRIC HISTORY:  G4, P3, three vaginal deliveries and a 20 week loss.   GYNECOLOGIC HISTORY:  Cycles are monthly, but last 7 days.  She has noted  some heaving bleeding.  No history of abnormal Pap smear.   MEDICATIONS:  None.   ALLERGIES:  1.  MORPHINE.  2.   LATEX.   FAMILY HISTORY:  Diabetes, hypertension, coronary artery disease, fibroid  uterus.   SOCIAL HISTORY:  The patient lives with her children.  She is working.   REVIEW OF SYSTEMS:  A 14-point review of systems is reviewed and is negative  except as in the HPI.   PHYSICAL EXAMINATION:  VITAL SIGNS:  Her weight is 197.2 today, blood  pressure is 146/82, pulse is 81, temp is 98.8.  GENERAL: She is a well-developed, well-nourished black female in no acute  distress.  NECK:  Supple.  Normal thyroid.  BREAST:  Symmetric with fibrocystic change in the bilateral lower quadrants.  No supraclavicular or axillary adenopathy.  ABDOMEN:  Soft, nontender, nondistended.  EXTREMITIES:  No cyanosis, clubbing or edema.  GENITOURINARY:  Normal external female genitalia.  BUS normal.  The vagina  is pink and rugated.  The cervix was visualized, a nabothian cyst on the  anterior lip.  The uterus is well demarcated.  It is approximately 12 to 14  weeks' size with large fibroid anteriorly.  The adnexa and uterus were all  diffusely tender.  Adnexa masses could not be truly appreciated.   IMPRESSION:  1.  Yearly exam with Pap smear.  2.  Dysuria.  3.  Fibroid uterus.  4.  Questionable diabetes.  5.  Vaginal discharge.  6.  High risk sexual behavior.   PLAN:  1.  Pap smear today.  2.  UA.  3.  Hemoglobin A1C.  4.  Wet prep.  5.  GC and chlamydia.  6.  HIV and a RPR.  7.  Pelvic ultrasound.  8.  We will followup the patient in approximately four weeks for discussion      of results and further treatment.           ______________________________  Darron Doom, MD     TP/MEDQ  D:  06/23/2005  T:  06/23/2005  Job:  [id number]

## 2010-10-22 NOTE — Group Therapy Note (Signed)
Shannon Caldwell, LINDEMUTH NO.:  [id number]   MEDICAL RECORD NO.:  [id number]          PATIENT TYPE:  WOC   LOCATION:  [facility]                   FACILITY:  [facility]   PHYSICIAN:  Darron Doom, MD        DATE OF BIRTH:  07-Dec-1970   DATE OF SERVICE:  05/20/2004                                    CLINIC NOTE   CHIEF COMPLAINT:  Yearly Pap.   HISTORY OF PRESENT ILLNESS:  The patient is a 40 year old gravida 4 para 3-0-  1-3 who returns for a yearly Pap smear.  Her last Pap was in February 2004.  The patient reports that her periods remain normal but still are very heavy  in the beginning with large blood clots.   She complains of weight loss, approximately 60 pounds, over the last year or  so.  The patient thinks this is related to her diabetes.  She states that  her sugars have been out of control since she lost her health insurance.  Since that time, she has been unable to afford any of her medications,  including her medications for her diabetes.  The patient reports that her  sugars have improved now with her significant weight loss.  The patient  reports that over the last year she has had numerous repetitive yeast  infections that she also attributes to her poor glycemic control.   The patient has also complained today of burning with urination and amber-  colored urine.  She denies fever or belly pain or back pain.   PAST MEDICAL HISTORY:  History of pneumonia.  History of multiple  complications following ERCP that led to acute pancreatitis and iatrogenic  diabetes.  Also required multiple surgeries for removal of pseudocyst,  cholecystectomy, and pancreatic debridement.   PAST SURGICAL HISTORY:  BTL, five exploratory laparotomies.   OBSTETRICAL HISTORY:  G4 P3, three vaginal deliveries and a 20-week loss.   GYNECOLOGICAL HISTORY:  Status post tubal ligation.  Menarche at age 40,  cycles regular every 28 days, last 7 days, 3 days of heavy bleeding.  No  history of abnormal Pap smear.   FAMILY HISTORY:  Diabetes, hypertension, coronary artery disease, fibroid  uterus.   SOCIAL HISTORY:  The patient lives with her children.  She has a new job in  which she does not have health insurance yet.   REVIEW OF SYSTEMS:  A 14-point review of systems is done and is negative  except as in the HPI.   PHYSICAL EXAMINATION TODAY:  VITAL SIGNS:  Her weight is 187.5, blood  pressure 126/76.  GENERAL:  She is a well-developed,well-nourished black female in no acute  distress.  NECK:  Her thyroid is normal size, shape, and contour.  BREASTS:  Symmetric with some fibrocystic change in the lower quadrants  bilaterally.  There is no supraclavicular or axillary adenopathy.  ABDOMEN:  Soft, nontender, nondistended.  GENITOURINARY:  She has normal external female genitalia.  The vagina is  pink and rugated.  Cervix is visualized.  There is a probable large  nabothian cyst on the anterior lip.  The uterus is  well demarcated today and  has a large fibroid anteriorly.  It is approximately 12- to 14-week size.  The adnexa were without mass or tenderness.   IMPRESSION:  1.  Yearly examination with Pap smear.  2.  Dysuria.  3.  Significant weight loss.  4.  Fibroid uterus.  5.  Diabetes that is poorly controlled, secondary to loss of health      insurance and inability to afford medications.   PLAN:  1.  Pap smear today.  2.  Urinalysis with culture as needed.  3.  Hemoglobin A1c, TSH today.  4.  Approximately 20 minutes was spent with this patient encouraging her      appropriate follow-up for her diabetes.  Complication risk was discussed      with this patient, including blindness, kidney failure, dialysis, and      even death.  The patient seems to understand these risks and states that      she will get back to working on her diabetes.      TP/MEDQ  D:  05/20/2004  T:  05/20/2004  Job:  [id number]

## 2010-10-22 NOTE — Group Therapy Note (Signed)
NAME:  Shannon Caldwell, Shannon Caldwell                         ACCOUNT NO.:  [id number]   MEDICAL RECORD NO.:  [id number]                   PATIENT TYPE:  OUT   LOCATION:  [name]                           FACILITY:  [facility]   PHYSICIAN:  Darron Doom, MD                     DATE OF BIRTH:  [birth year]   DATE OF SERVICE:  06/05/2003                                    CLINIC NOTE   CHIEF COMPLAINT:  Abnormal vaginal discharge, lower abdominal pain.   HISTORY AND PHYSICAL:  The patient is a 40 year old gravida 4 para 3-0-1-3  who has a very complicated past medical history including multiple abdominal  surgeries following an ERCP with acute pancreatitis that lead to iatrogenic  diabetes.  She is status post tubal ligation in 1997 but said she had a  positive pregnancy test in October at [facility] and was seen here at  the [facility] with a negative pregnancy test in November.  In  December she was seen at [facility] for abdominal pain and vaginal  discharge for which she had a CT scan and several other x-rays as well as a  blood test that failed to reveal any significant problems.  She did not  receive a GYN exam however, and in November at the [facility] visit she  did have a GC, chlamydia, and wet prep that were all negative.  She has had  intercourse with one person unprotected since that time but she thinks that  the symptoms were present before that.  She reports some heavy vaginal  discharge with a pretty significant odor.  There is no significant color to  it.  There is no vulvar irritation or perineal itching.  She does have  history of her last boyfriend having found that he was cheating on her.   PAST MEDICAL HISTORY:  Is as stated before.  She has iatrogenic diabetes.  She has a history of pneumonia in the past.   PAST SURGICAL HISTORY:  She had six total surgeries including a tubal  ligation and five exploratory laparotomies for pancreatic debridement and  pseudocyst  removal and cholecystectomy.   OBSTETRICAL HISTORY:  She is G4 P3.  She had three vaginal deliveries.  She  lost her last pregnancy at five months.   GYNECOLOGICAL HISTORY:  Menarche at age 31.  Cycles are regular, 28 days,  last seven days, with three days of heavy bleeding and the rest essentially  light.  She had a Pap in February 2004.  She has never had an abnormal Pap.   FAMILY HISTORY:  Significant for diabetes, hypertension, and coronary artery  disease.   SOCIAL HISTORY:  She lives with her children.  She does work outside the  home.  She is interested in going to med school at [employer].   REVIEW OF SYSTEMS:  A 14-point review of systems is significant for some  swelling in her  legs, occasional nausea and vomiting which seems to have  passed now, and then as elucidated in the HPI.  Please note on GYN review of  systems that she also reports pain with intercourse, especially with deep  penetration.   PHYSICAL EXAMINATION:  VITAL SIGNS:  Her weight is 219.5, blood pressure  126/82, pulse 96.  GENERAL:  She is a slightly-obese black female in no acute distress.  LUNGS:  Clear bilaterally.  CARDIOVASCULAR:  Regular rate and rhythm without rubs, gallops, or murmurs.  ABDOMEN:  Soft, nontender, nondistended.  PELVIC:  Reveals normal external female genitalia.  The vagina is rugated.  There is some clear discharge especially from the cervix.  GC and chlamydia  and wet prep are taken easily.  On bimanual exam she does have cervical  motion tenderness, some tenderness in the adnexa although no masses could be  appreciated.  Her wet prep showed occasional clue cells, no trich, no yeast.   IMPRESSION:  Vaginitis plus or minus cervicitis.   PLAN:  Will treat with course of Flagyl 500 mg p.o. b.i.d. and await GC and  chlamydia testing.  She will follow up in one month for results and to see  if her symptoms have improved.  If not, consider ultrasound at that time.                                                Darron Doom, MD    TP/MEDQ  D:  06/05/2003  T:  06/05/2003  Job:  [id number]

## 2010-10-22 NOTE — Group Therapy Note (Signed)
Shannon Caldwell, DANSER NO.:  [id number]   MEDICAL RECORD NO.:  [id number]          PATIENT TYPE:  WOC   LOCATION:  [facility]                   FACILITY:  [facility]   PHYSICIAN:  Rico Ala, M.D.   DATE OF BIRTH:  July 22, 1970   DATE OF SERVICE:                                    CLINIC NOTE   CHIEF COMPLAINT:  Followup for menorrhagia and dysmenorrhea.   HISTORY OF PRESENT ILLNESS:  The patient is a 40 year old African-American  female, who has been seen here for dysmenorrhea and menorrhagia.  She states  that she was treated with a Depo shot approximately 1 month ago and she  states that since she has had the Depo shot, her bleeding has been lighter,  but she has bled every day.  She states that she uses approximately 4 pads a  day.  Previously, she had to use tampons and pads, and would still soaked  through her pad.  She states that pain has resolved.  She does state that  she has itchy vaginal discharge from time to time.  She does state that she  has seen an endocrinologist and is on a diabetic medication regimen now  including Lantus.   IMPRESSION:  1.  Menorrhagia.  2.  Dysmenorrhea.  3.  Yeast vaginitis.   PLAN:  1.  For her menorrhagia, counsel patient that her bleeding will likely get      better with time, as she is on the Depo shot.  We have instructed her to      continue on the Depo every 3 months, but that it this does not work, we      could consider doing and ablation.  2.  Dysmenorrhea, resolved.  3.  Yeast vaginitis.  A prescription was given for Diflucan 150 mg 1 p.o. x1      with 3 refills.  The patient was also instructed to try yogurt with      active cultures in it.  4.  The patient is to follow up in 2 months.  5.  Plan was discussed with Dr. Kalman Shan.           ______________________________  Rico Ala, M.D.     AC/MEDQ  D:  08/17/2005  T:  08/18/2005  Job:  [id number]

## 2010-10-22 NOTE — Group Therapy Note (Signed)
NAME:  Shannon Caldwell, Shannon Caldwell NO.:  [id number]   MEDICAL RECORD NO.:  [id number]          PATIENT TYPE:  WOC   LOCATION:  [facility]                   FACILITY:  [facility]   PHYSICIAN:  Rico Ala, M.D.   DATE OF BIRTH:  Sep 15, 1970   DATE OF SERVICE:                                    CLINIC NOTE   CHIEF COMPLAINT:  Here to followup.   HISTORY OF PRESENT ILLNESS:  The patient is a 40 year old African-American  female, who is here to followup from her recent visit.  She states that she  has had frequent urination, and also an itchy discharge and tenderness.  She  also states that she has had heavy bleeding for 3 months lasting 10 days.  She states that they are still regular.  She has had dysmenorrhea for quite  some time mainly including back pain with her periods.  She had a tubal in  1997.  Urine pregnancy test negative today.   PHYSICAL EXAMINATION:  VITAL SIGNS:  Pulse 84, blood pressure 140/93, weight  193.9 and height 5 feet 5 inches.  GENERAL:  A well-developed, well-nourished female in no acute distress.  GENITOURINARY EXAM:  The vagina is pink and rugated.  She had mild  tenderness over her with bimanual exam, but was much more tender over her  uterus with bimanual exam.   An endometrial biopsy was done.  The cervix was cleansed with Betadine, a  tenaculum was placed, the uterus was sounded to 9.5 cm and an endometrial  biopsy pipette was passed and a biopsy was taken.  The patient tolerated the  procedure well.   SIGNIFICANT LABORATORY:  The patient recently had a pelvic ultrasound done,  which was normal.  She had a wet prep with clue cells and she had a  hemoglobin A1c of 12.1.   IMPRESSION:  1.  Diabetes.  2.  Menorrhagia.  3.  Dysmenorrhea.  4.  Hypertension.   PLAN:  1.  Diabetes.  The patient is to follow up with an endocrinologist.  She has      an appointment tomorrow.  2.  Menorrhagia.  The patient had an ultrasound that was negative and  an      endometrial biopsy was done today.  The patient received an injection of      Depo shot today.  We will see the patient back in 2 to 3 weeks and      discuss with her further management options.  3.  Dysmenorrhea.  In light of her frequent urination and pelvic pain,      consider a urology workup as patient was only mildly tender over her      bladder, but was more tender over her uterus.  I am not convinced she      has interstitial cystitis.  If polyuria does not resolve with treatment      of her diabetes, then consider a urology consult.  4.  Dysmenorrhea.  We will see how the patient responds to Depo.  5.  Patient to follow up in 2 to 3 weeks.  ______________________________  Rico Ala, M.D.     AC/MEDQ  D:  07/19/2005  T:  07/19/2005  Job:  [id number]

## 2010-10-27 ENCOUNTER — Encounter: Payer: Self-pay | Admitting: Family Medicine

## 2010-10-28 ENCOUNTER — Ambulatory Visit (INDEPENDENT_AMBULATORY_CARE_PROVIDER_SITE_OTHER): Payer: BC Managed Care – PPO | Admitting: Family Medicine

## 2010-10-28 DIAGNOSIS — J019 Acute sinusitis, unspecified: Secondary | ICD-10-CM

## 2010-10-28 DIAGNOSIS — L0291 Cutaneous abscess, unspecified: Secondary | ICD-10-CM

## 2010-10-28 MED ORDER — DOXYCYCLINE HYCLATE 100 MG PO TABS: 100.0000 mg | ORAL_TABLET | Freq: Two times a day (BID) | ORAL | Status: AC

## 2010-10-28 MED ORDER — BENZONATATE 200 MG PO CAPS: 200.0000 mg | ORAL_CAPSULE | Freq: Three times a day (TID) | ORAL | Status: AC | PRN

## 2010-10-28 MED ORDER — IBUPROFEN 800 MG PO TABS: 800.0000 mg | ORAL_TABLET | Freq: Three times a day (TID) | ORAL | Status: DC | PRN

## 2010-10-28 NOTE — Patient Instructions (Signed)
This is a sinus infection Take the Doxycyline twice a day for 10 days- w/ food- to treat both the sinuses and abscess Continue the hot compresses on the leg Use the cough meds as needed Hang in there!!!

## 2010-10-28 NOTE — Progress Notes (Signed)
  Subjective:    Patient ID: Shannon Caldwell, female    DOB: 07-23-1970, 40 y.o.   MRN: [id number]  HPI Cough- sxs started 3-4 days ago.  Worse at night.  Cough is dry.  + nasal congestion, facial pain/pressure.  Bilateral ear fullness.  Abscess- R upper thigh.  Applied hot compresses to area, came to a head- drained.  Less sore than previously but still oozing.     Review of Systems For ROS see HPI     Objective:   Physical Exam  Constitutional: She appears well-developed and well-nourished. No distress.  HENT:  Head: Normocephalic and atraumatic.  Right Ear: Tympanic membrane normal.  Left Ear: Tympanic membrane normal.  Nose: Mucosal edema and rhinorrhea present. Right sinus exhibits maxillary sinus tenderness and frontal sinus tenderness. Left sinus exhibits maxillary sinus tenderness and frontal sinus tenderness.  Mouth/Throat: Uvula is midline and mucous membranes are normal. Posterior oropharyngeal erythema present. No oropharyngeal exudate.  Eyes: Conjunctivae and EOM are normal. Pupils are equal, round, and reactive to light.  Neck: Normal range of motion. Neck supple.  Cardiovascular: Normal rate, regular rhythm and normal heart sounds.   Pulmonary/Chest: Effort normal and breath sounds normal. No respiratory distress. She has no wheezes.  Lymphadenopathy:    She has no cervical adenopathy.  Skin:       2 cm draining abscess on R posterior upper thigh.  No fluctuance or area requiring drainage.          Assessment & Plan:

## 2010-11-02 NOTE — Assessment & Plan Note (Signed)
Pt w/ hx of MRSA and w/ recurrent abscess.  Start Doxy which will cover both skin infxn and sinusitis.  Reviewed supportive care and red flags that should prompt return.  Pt expressed understanding and is in agreement w/ plan.

## 2010-11-02 NOTE — Assessment & Plan Note (Signed)
Start doxy for sinus infxn.  Reviewed supportive care and red flags that should prompt return.  Pt expressed understanding and is in agreement w/ plan.

## 2010-11-25 ENCOUNTER — Ambulatory Visit (INDEPENDENT_AMBULATORY_CARE_PROVIDER_SITE_OTHER): Payer: BC Managed Care – PPO | Admitting: Family Medicine

## 2010-11-25 DIAGNOSIS — H6690 Otitis media, unspecified, unspecified ear: Secondary | ICD-10-CM | POA: Insufficient documentation

## 2010-11-25 DIAGNOSIS — B373 Candidiasis of vulva and vagina: Secondary | ICD-10-CM

## 2010-11-25 DIAGNOSIS — R42 Dizziness and giddiness: Secondary | ICD-10-CM

## 2010-11-25 DIAGNOSIS — B3731 Acute candidiasis of vulva and vagina: Secondary | ICD-10-CM

## 2010-11-25 DIAGNOSIS — N920 Excessive and frequent menstruation with regular cycle: Secondary | ICD-10-CM

## 2010-11-25 LAB — CBC WITH DIFFERENTIAL/PLATELET
Basophils Absolute: 0 10*3/uL (ref 0.0–0.1)
Basophils Relative: 0.9 % (ref 0.0–3.0)
Eosinophils Absolute: 0 10*3/uL (ref 0.0–0.7)
Eosinophils Relative: 0.7 % (ref 0.0–5.0)
HCT: 30 % — ABNORMAL LOW (ref 36.0–46.0)
Hemoglobin: 9.7 g/dL — ABNORMAL LOW (ref 12.0–15.0)
Lymphocytes Relative: 29.4 % (ref 12.0–46.0)
Lymphs Abs: 1.3 10*3/uL (ref 0.7–4.0)
MCHC: 32.4 g/dL (ref 30.0–36.0)
MCV: 82.2 fl (ref 78.0–100.0)
Monocytes Absolute: 0.3 10*3/uL (ref 0.1–1.0)
Monocytes Relative: 6 % (ref 3.0–12.0)
Neutro Abs: 2.8 10*3/uL (ref 1.4–7.7)
Neutrophils Relative %: 63 % (ref 43.0–77.0)
Platelets: 282 10*3/uL (ref 150.0–400.0)
RBC: 3.65 Mil/uL — ABNORMAL LOW (ref 3.87–5.11)
RDW: 16.9 % — ABNORMAL HIGH (ref 11.5–14.6)
WBC: 4.5 10*3/uL (ref 4.5–10.5)

## 2010-11-25 MED ORDER — FLUCONAZOLE 150 MG PO TABS: 150.0000 mg | ORAL_TABLET | Freq: Once | ORAL | Status: DC

## 2010-11-25 MED ORDER — AMOXICILLIN 500 MG PO CAPS: 500.0000 mg | ORAL_CAPSULE | Freq: Two times a day (BID) | ORAL | Status: AC

## 2010-11-25 NOTE — Progress Notes (Signed)
  Subjective:    Patient ID: Shannon Caldwell, female    DOB: [birth year], 40 y.o.   MRN: [id number]  HPI Dizziness yesterday- occurred at work yesterday.  Works in [employer], was very hot.  Is 'seeing spots' continuously since yesterday.  Drinking lots of water.  Is craving ice.  Dizziness worsens w/ position changes.  Denies vertigo.  'more of an off balanced feeling'.  + nausea.  Having episodes of hypoglycemia.  Has not called endo.  + menorrhagia.  + sinus pressure.  Vaginal discharge- started after completing doxy.  D/c is 'thick and clumpy'.  Having vaginal burning.   Review of Systems For ROS see HPI     Objective:   Physical Exam  Constitutional: She is oriented to person, place, and time. She appears well-developed and well-nourished. No distress.  HENT:  Head: Normocephalic and atraumatic.  Nose: Mucosal edema and rhinorrhea present. Right sinus exhibits no maxillary sinus tenderness and no frontal sinus tenderness. Left sinus exhibits no maxillary sinus tenderness and no frontal sinus tenderness.  Mouth/Throat: Uvula is midline and mucous membranes are normal. Posterior oropharyngeal erythema present. No oropharyngeal exudate.       L TM erythematous, dull, poor landmarks R TM WNL  Eyes: EOM are normal. Pupils are equal, round, and reactive to light.       Pale conjunctiva  Neck: Normal range of motion. Neck supple.  Cardiovascular: Normal rate, regular rhythm and normal heart sounds.   Pulmonary/Chest: Effort normal and breath sounds normal. No respiratory distress. She has no wheezes.  Abdominal: Soft. Bowel sounds are normal. She exhibits no distension. There is no tenderness. There is no rebound and no guarding.  Musculoskeletal: She exhibits no edema.  Lymphadenopathy:    She has no cervical adenopathy.  Neurological: She is alert and oriented to person, place, and time. She has normal reflexes. No cranial nerve deficit. Coordination normal.  Skin: Skin is warm and dry.    Psychiatric: She has a normal mood and affect. Her behavior is normal.          Assessment & Plan:

## 2010-11-25 NOTE — Patient Instructions (Signed)
You have lots of reasons to feel dizzy!  Ear infection, dehydration, low sugar, and possible anemia We'll notify you of your lab results Make sure you are drinking TONS of fluid! Eat regularly to avoid your sugar dropping Take the Amoxicillin as directed for your ear infection- take w/ food to avoid upset stomach Take the Diflucan now and then take the 2nd pill when you're done with your antibiotics Call with any questions or concerns Hang in there!

## 2010-11-26 ENCOUNTER — Encounter: Payer: Self-pay | Admitting: *Deleted

## 2010-11-26 ENCOUNTER — Telehealth: Payer: Self-pay | Admitting: *Deleted

## 2010-11-26 MED ORDER — FERROUS SULFATE 325 (65 FE) MG PO TABS: 325.0000 mg | ORAL_TABLET | Freq: Every day | ORAL | Status: DC

## 2010-11-26 NOTE — Telephone Encounter (Signed)
Message copied by Marylen Ponto on Fri Nov 26, 2010 12:50 PM ------      Message from: Midge Minium      Created: Thu Nov 25, 2010  4:55 PM       She is anemic.  Should start FeSO4 (iron) 325mg  daily.  Should start OTC stool softener w/ this to avoid constipation.

## 2010-11-26 NOTE — Telephone Encounter (Signed)
Discuss with patient copy of labs mailed

## 2010-12-02 ENCOUNTER — Telehealth: Payer: Self-pay | Admitting: *Deleted

## 2010-12-02 MED ORDER — METRONIDAZOLE 500 MG PO TABS: 500.0000 mg | ORAL_TABLET | Freq: Two times a day (BID) | ORAL | Status: AC

## 2010-12-02 NOTE — Telephone Encounter (Signed)
Ok to call in flagyl 500mg - 1 tab bid x7 days, #14, no refills

## 2010-12-02 NOTE — Telephone Encounter (Signed)
Pt still has vaginal odor and white thick discharge. Pt notes that she has taken 1 of the Diflucan but has not complete antibiotic yet so she has not taken 2nd tab. Pt feels that it is more of a bacterial infection now versus a yeast infection and would like to know what can be done.Please advise

## 2010-12-02 NOTE — Telephone Encounter (Signed)
Pt.notified

## 2010-12-05 ENCOUNTER — Encounter: Payer: Self-pay | Admitting: Family Medicine

## 2010-12-05 NOTE — Assessment & Plan Note (Signed)
Per pt report.  Has pale conjunctiva and is craving ice- sxs of anemia.  Check CBC.  May be contributing to pt's dizziness.

## 2010-12-05 NOTE — Assessment & Plan Note (Signed)
Started after course of abx.  Start Diflucan.  Will likely need to repeat dose since she is again starting abx.  Pt expressed understanding and is in agreement w/ plan.

## 2010-12-05 NOTE — Assessment & Plan Note (Signed)
Likely multifactorial- dehydration, heat of warehouse, anemia, symptomatic lows (instructed to call endo to adjust meds).  Pt to increase fluid intake, eat regularly, change positions slowly.  Neuro exam intact in office.  Will follow closely.  Pt expressed understanding and is in agreement w/ plan.

## 2010-12-05 NOTE — Assessment & Plan Note (Signed)
L OM.  Likely contributing to dizziness.  Start abx.  Reviewed supportive care and red flags that should prompt return.  Pt expressed understanding and is in agreement w/ plan.

## 2011-01-25 ENCOUNTER — Encounter: Payer: Self-pay | Admitting: Family Medicine

## 2011-01-25 ENCOUNTER — Ambulatory Visit (INDEPENDENT_AMBULATORY_CARE_PROVIDER_SITE_OTHER): Payer: BC Managed Care – PPO | Admitting: Family Medicine

## 2011-01-25 DIAGNOSIS — R21 Rash and other nonspecific skin eruption: Secondary | ICD-10-CM

## 2011-01-25 DIAGNOSIS — J019 Acute sinusitis, unspecified: Secondary | ICD-10-CM

## 2011-01-25 MED ORDER — AMOXICILLIN 875 MG PO TABS: 875.0000 mg | ORAL_TABLET | Freq: Two times a day (BID) | ORAL | Status: AC

## 2011-01-25 MED ORDER — MOMETASONE FUROATE 50 MCG/ACT NA SUSP: 2.0000 | Freq: Every day | NASAL | Status: DC

## 2011-01-25 MED ORDER — TRIAMCINOLONE ACETONIDE 0.1 % EX OINT: TOPICAL_OINTMENT | Freq: Two times a day (BID) | CUTANEOUS | Status: DC

## 2011-01-25 NOTE — Progress Notes (Signed)
  Subjective:    Patient ID: Shannon Caldwell, female    DOB: 09-Dec-1970, 40 y.o.   MRN: [id number]  HPI Rash- on forearms bilaterally, itchy, is irritated by sun and water.  It is intermittent.  Has been present for the last few months.  Others at work have similar rash.  R sided facial pain, R sore throat, R ear pain.  Nose is sore.  sxs started 6 days ago.  Low grade temps the first few days.  Will cough at night.  + fatigue.     Review of Systems For ROS see HPI     Objective:   Physical Exam  Constitutional: She appears well-developed and well-nourished. No distress.  HENT:  Head: Normocephalic and atraumatic.  Right Ear: Tympanic membrane normal.  Left Ear: Tympanic membrane normal.  Nose: Mucosal edema and rhinorrhea present. Right sinus exhibits maxillary sinus tenderness and frontal sinus tenderness. Left sinus exhibits no maxillary sinus tenderness and no frontal sinus tenderness.  Mouth/Throat: Uvula is midline and mucous membranes are normal. Posterior oropharyngeal erythema present. No oropharyngeal exudate.  Eyes: Conjunctivae and EOM are normal. Pupils are equal, round, and reactive to light.  Neck: Normal range of motion. Neck supple.  Cardiovascular: Normal rate, regular rhythm and normal heart sounds.   Pulmonary/Chest: Effort normal and breath sounds normal. No respiratory distress. She has no wheezes.  Lymphadenopathy:    She has no cervical adenopathy.  Skin: Rash (consistent w/ contact dermatitis on forearms bilaterally) noted.          Assessment & Plan:

## 2011-01-25 NOTE — Patient Instructions (Signed)
Take the Amoxicillin as directed for the sinus infection- take w/ food to avoid upset stomach Start the nasal spray as directed- 2 sprays each nostril daily Use the triamcinolone ointment on the skin twice a day Hang in there!!

## 2011-01-26 NOTE — Assessment & Plan Note (Signed)
Pt's sxs consistent w/ sinus infxn.  Start abx.  Reviewed supportive care and red flags that should prompt return.  Pt expressed understanding and is in agreement w/ plan. r

## 2011-01-26 NOTE — Assessment & Plan Note (Signed)
Consistent w/ contact dermatitis.  Start steroid ointment.  Reviewed supportive care and red flags that should prompt return.  Pt expressed understanding and is in agreement w/ plan.

## 2011-01-28 ENCOUNTER — Telehealth: Payer: Self-pay | Admitting: Family Medicine

## 2011-01-28 NOTE — Telephone Encounter (Signed)
Left msg for pt to return call.

## 2011-01-28 NOTE — Telephone Encounter (Signed)
Pt is returning call [phone]

## 2011-01-28 NOTE — Telephone Encounter (Signed)
Spoke w/ pt says that symptoms haven't resolved appt scheduled for sat clinic.

## 2011-01-28 NOTE — Telephone Encounter (Signed)
Pt called left msg on triage voicemail says that since she started nasonex her eyes,nose have been swollen and also noticed burning sensation.   Left msg for pt to return call.

## 2011-01-29 ENCOUNTER — Ambulatory Visit (INDEPENDENT_AMBULATORY_CARE_PROVIDER_SITE_OTHER): Payer: BC Managed Care – PPO | Admitting: Family Medicine

## 2011-01-29 ENCOUNTER — Encounter: Payer: Self-pay | Admitting: Family Medicine

## 2011-01-29 VITALS — BP 130/80 | Temp 98.3°F | Wt 185.0 lb

## 2011-01-29 DIAGNOSIS — J3489 Other specified disorders of nose and nasal sinuses: Secondary | ICD-10-CM

## 2011-01-29 NOTE — Patient Instructions (Signed)
ALLERGIES -Sneezing, runny and stuffy nose, itchy eyes, nose, throat, tears.  TREATMENT 1. Avoid offending allergen 2. Air filtration devices can reduce concentration of allergen 4. Sedating (Benadryl, Zyrtec) and Non-sedating (Allegra, Claritin,Loratidine,Alavert) antihistamines are available at pharmacy 5. Presciption anti-histamines are available if the above fail

## 2011-01-29 NOTE — Progress Notes (Signed)
  Subjective:    Patient ID: Shannon Caldwell, female    DOB: 16-Jun-1970, 40 y.o.   MRN: [id number]  HPI  Shannon Caldwell, a 40 y.o. female presents today in the office for the following:    Very pleasant patient who was seen several days ago by her primary care provider, was diagnosed with a sinus infection, placed on amoxicillin, and given some Nasonex for symptomatic relief. Since that time she has developed some significant amount of burning on the inside of her nostrils, and this feels very uncomfortable. She has some degree of tingling sensation, primarily in the nose. She has a sensation of some mild degree of tolerance or swelling in and around the nose as well. She also has some tingling around her lips as well.  The patient denies any history of penicillin allergy. She has no rash. She is breathing comfortably eating comfortably.  Came in about two days ago, has been doing some tinglig and swollen to touch, tingling around her lips.   The PMH, PSH, Social History, Family History, Medications, and allergies have been reviewed in [facility], and have been updated if relevant.  Review of Systems Acute sinus symptoms as above recently, otherwise per history of present illness. No shortness of breath.    Objective:   Physical Exam   Physical Exam  Blood pressure 130/80, temperature 98.3 F (36.8 C), temperature source Oral, weight 185 lb (83.915 kg).  GEN: WDWN, NAD, Non-toxic, A & O x 3 HEENT: Atraumatic, Normocephalic. Neck supple. No masses, No LAD. Maxillary sinuses are tender to palpation. Nares are reddened and inflamed appearing. There is no ulceration. There is some mucus production. On palpation, the nose and nares on both sides are tender to palpation. Ears and Nose: No external deformity. EXTR: No c/c/e NEURO Normal gait.  PSYCH: Normally interactive. Conversant. Not depressed or anxious appearing.  Calm demeanor.        Assessment & Plan:   1. Nose pain    Thing most  likely secondary to Nasonex use. Nasal irritation is described as a common side effect of its use. The patient does not appear to be having generalized allergic reaction, or rash. For now, I have asked her to discontinue her Nasonex. Start some antihistamines. Reassured her this will likely resolve on its own. If she has worsening of facial or lip swelling, she needs to have emergent evaluation, and she understands this.

## 2011-01-31 ENCOUNTER — Telehealth: Payer: Self-pay | Admitting: *Deleted

## 2011-01-31 ENCOUNTER — Encounter: Payer: Self-pay | Admitting: *Deleted

## 2011-01-31 NOTE — Telephone Encounter (Signed)
Ok to extend work note 

## 2011-01-31 NOTE — Telephone Encounter (Signed)
Left message for pt to call back for verification of faxing work note or picking up

## 2011-01-31 NOTE — Telephone Encounter (Signed)
Pt needs work note to be extended until today. Pt notes that she was still not feeling any better so she did not go to work on Friday. Pt was seen in Saturday clinic due to symptoms not resolving. Ok to extend note until today. Please advise

## 2011-01-31 NOTE — Telephone Encounter (Signed)
Pt aware note faxed to [phone]

## 2011-02-10 DIAGNOSIS — Z0279 Encounter for issue of other medical certificate: Secondary | ICD-10-CM

## 2011-03-07 LAB — URINE MICROSCOPIC-ADD ON

## 2011-03-07 LAB — BASIC METABOLIC PANEL
BUN: 7
CO2: 25
Calcium: 8.8
Chloride: 94 — ABNORMAL LOW
Creatinine, Ser: 0.7
GFR calc Af Amer: >60
GFR calc non Af Amer: >60
Glucose, Bld: 596
Potassium: 3.9
Sodium: 130 — ABNORMAL LOW

## 2011-03-07 LAB — GLUCOSE, CAPILLARY: Glucose-Capillary: 363 — ABNORMAL HIGH

## 2011-03-07 LAB — STOOL CULTURE

## 2011-03-07 LAB — URINALYSIS, ROUTINE W REFLEX MICROSCOPIC
Glucose, UA: >1000 — AB
Hgb urine dipstick: NEGATIVE
Ketones, ur: NEGATIVE
Leukocytes, UA: NEGATIVE
pH: 6

## 2011-03-07 LAB — CLOSTRIDIUM DIFFICILE EIA: C difficile Toxins A+B, EIA: NEGATIVE

## 2011-03-07 LAB — OVA AND PARASITE EXAMINATION: Ova and parasites: NONE SEEN

## 2011-03-11 LAB — URINE MICROSCOPIC-ADD ON

## 2011-03-11 LAB — URINALYSIS, ROUTINE W REFLEX MICROSCOPIC
Glucose, UA: >1000 mg/dL — AB
Ketones, ur: NEGATIVE mg/dL
Leukocytes, UA: NEGATIVE
Nitrite: NEGATIVE
Specific Gravity, Urine: 1.019 (ref 1.005–1.030)
pH: 6.5 (ref 5.0–8.0)

## 2011-03-11 LAB — DIFFERENTIAL
Basophils Relative: 0 % (ref 0–1)
Lymphs Abs: 1.6 10*3/uL (ref 0.7–4.0)
Monocytes Absolute: 0.4 10*3/uL (ref 0.1–1.0)
Monocytes Relative: 10 % (ref 3–12)
Neutro Abs: 2.5 10*3/uL (ref 1.7–7.7)
Neutrophils Relative %: 54 % (ref 43–77)

## 2011-03-11 LAB — GLUCOSE, CAPILLARY

## 2011-03-11 LAB — CBC
HCT: 40 % (ref 36.0–46.0)
MCHC: 33.1 g/dL (ref 30.0–36.0)
MCV: 85.6 fL (ref 78.0–100.0)
Platelets: 319 10*3/uL (ref 150–400)
RDW: 12.2 % (ref 11.5–15.5)

## 2011-03-11 LAB — BASIC METABOLIC PANEL
BUN: 8 mg/dL (ref 6–23)
Chloride: 95 mEq/L — ABNORMAL LOW (ref 96–112)
Glucose, Bld: 248 mg/dL — ABNORMAL HIGH (ref 70–99)
Potassium: 3.4 mEq/L — ABNORMAL LOW (ref 3.5–5.1)

## 2011-03-11 LAB — PREGNANCY, URINE: Preg Test, Ur: NEGATIVE

## 2011-03-14 ENCOUNTER — Encounter: Payer: Self-pay | Admitting: Family Medicine

## 2011-03-14 ENCOUNTER — Telehealth: Payer: Self-pay | Admitting: Family Medicine

## 2011-03-14 ENCOUNTER — Ambulatory Visit (INDEPENDENT_AMBULATORY_CARE_PROVIDER_SITE_OTHER): Payer: BC Managed Care – PPO | Admitting: Family Medicine

## 2011-03-14 DIAGNOSIS — L0291 Cutaneous abscess, unspecified: Secondary | ICD-10-CM

## 2011-03-14 DIAGNOSIS — I10 Essential (primary) hypertension: Secondary | ICD-10-CM

## 2011-03-14 DIAGNOSIS — L0390 Cellulitis, unspecified: Secondary | ICD-10-CM

## 2011-03-14 MED ORDER — DOXYCYCLINE HYCLATE 100 MG PO TABS: 100.0000 mg | ORAL_TABLET | Freq: Two times a day (BID) | ORAL | Status: AC

## 2011-03-14 MED ORDER — LISINOPRIL 10 MG PO TABS: 10.0000 mg | ORAL_TABLET | Freq: Every day | ORAL | Status: DC

## 2011-03-14 MED ORDER — TRAMADOL HCL 50 MG PO TABS: 50.0000 mg | ORAL_TABLET | Freq: Four times a day (QID) | ORAL | Status: DC | PRN

## 2011-03-14 NOTE — Telephone Encounter (Signed)
Patient seed today [id number] - forgot to ask for pain med - [facility]

## 2011-03-14 NOTE — Patient Instructions (Signed)
Please follow up in 1 month to recheck BP We'll notify you of your surgery appt Start the Doxycycline as directed- take w/ food to avoid upset stomach Try and find a stress outlet! Hang in there!

## 2011-03-14 NOTE — Telephone Encounter (Signed)
Pt can have refill on Tramadol.  Will send to pharmacy.

## 2011-03-14 NOTE — Progress Notes (Signed)
  Subjective:    Patient ID: Shannon Caldwell, female    DOB: 08-Jan-1971, 40 y.o.   MRN: [id number]  HPI Skin infection- there was a 'little bump' on the back on neck, now area is large, painful.  Unable to turn head w/out pain.  First appeared 3 days ago.  Elevated BP- is working 3 jobs, admits to increased stress, not sleeping well.  On HCTZ- has never been on ACE.  No CP, SOB, edema, visual changes.   Review of Systems For ROS see HPI     Objective:   Physical Exam  Vitals reviewed. Constitutional: She is oriented to person, place, and time. She appears well-developed and well-nourished. No distress.  HENT:  Head: Normocephalic and atraumatic.  Eyes: Conjunctivae and EOM are normal. Pupils are equal, round, and reactive to light.  Neck: Normal range of motion. Neck supple. No thyromegaly present.  Cardiovascular: Normal rate, regular rhythm, normal heart sounds and intact distal pulses.   No murmur heard. Pulmonary/Chest: Effort normal and breath sounds normal. No respiratory distress.  Musculoskeletal: She exhibits no edema.  Lymphadenopathy:    She has no cervical adenopathy.  Neurological: She is alert and oriented to person, place, and time.  Skin: Skin is warm and dry.       Small indurated hair follicle on R lower hairline w/ surrounding erythema and almost kerion like feel  Psychiatric: She has a normal mood and affect. Her behavior is normal.          Assessment & Plan:

## 2011-03-15 ENCOUNTER — Encounter (INDEPENDENT_AMBULATORY_CARE_PROVIDER_SITE_OTHER): Payer: Self-pay | Admitting: Surgery

## 2011-03-15 ENCOUNTER — Ambulatory Visit (INDEPENDENT_AMBULATORY_CARE_PROVIDER_SITE_OTHER): Payer: Self-pay | Admitting: Surgery

## 2011-03-15 VITALS — BP 148/98 | HR 80 | Temp 97.2°F | Resp 20 | Ht 66.0 in | Wt 190.5 lb

## 2011-03-15 DIAGNOSIS — L732 Hidradenitis suppurativa: Secondary | ICD-10-CM

## 2011-03-15 NOTE — Progress Notes (Signed)
Chief Complaint  Patient presents with  . Other    eval of abscess on the back of the neck     HPI Shannon Caldwell is a 40 y.o. female.  HPI The patient presents today at the request of Dr. Birdie Riddle due to pain and swelling over her posterior neck. Skin present for one week. She was seen yesterday and placed on doxycycline. The area sore especially when turning her head. She has headaches as well. Fever or chills. Ear he is located in her hairline posterior neck.  Past Medical History  Diagnosis Date  . Diabetes mellitus, type 2   . Blood transfusion   . GERD (gastroesophageal reflux disease)   . Hyperlipidemia   . Hypertension   . Generalized headaches   . Cervical pain (neck)     due to abscess     Past Surgical History  Procedure Date  . Cholecystectomy   . Appendectomy   . Pancreatic debridgement   . Cytocyst removal   . Hernia repair August 2000, August [id number]    umbilical hernia     Family History  Problem Relation Age of Onset  . Hypertension Mother     grandfather  . Diabetes Mother     grandmother  . Stroke      grandparent  . Lung cancer Maternal Grandfather     Social History History  Substance Use Topics  . Smoking status: Never Smoker   . Smokeless tobacco: Never Used  . Alcohol Use: No    Allergies  Allergen Reactions  . Morphine     Current Outpatient Prescriptions  Medication Sig Dispense Refill  . doxycycline (VIBRA-TABS) 100 MG tablet Take 1 tablet (100 mg total) by mouth 2 (two) times daily.  28 tablet  0  . ferrous sulfate 325 (65 FE) MG tablet Take 1 tablet (325 mg total) by mouth daily with breakfast.  30 tablet  2  . glucose blood (FREESTYLE TEST STRIPS) test strip 1 each by Other route. Use as instructed       . hydrochlorothiazide 25 MG tablet Take 25 mg by mouth daily.        Marland Kitchen ibuprofen (ADVIL,MOTRIN) 800 MG tablet       . insulin aspart (NOVOLOG FLEXPEN) 100 UNIT/ML injection Sliding scale,with meals.  9 mL  3  . insulin detemir  (LEVEMIR FLEXPEN) 100 UNIT/ML injection Inject 35 Units into the skin 2 (two) times daily.        . Insulin Pen Needle (NOVOFINE) 32G X 6 MM MISC by Does not apply route as directed.        . Lancets (FREESTYLE) lancets 1 each by Other route. Use as instructed       . lisinopril (PRINIVIL,ZESTRIL) 10 MG tablet Take 1 tablet (10 mg total) by mouth daily.  30 tablet  6  . mometasone (NASONEX) 50 MCG/ACT nasal spray Place 2 sprays into the nose daily.  17 g  2  . traMADol (ULTRAM) 50 MG tablet Take 1 tablet (50 mg total) by mouth every 6 (six) hours as needed for pain.  45 tablet  0  . triamcinolone (KENALOG) 0.1 % ointment Apply topically 2 (two) times daily.  90 g  1    Review of Systems Review of Systems  Constitutional: Positive for fatigue. Negative for fever and chills.  HENT: Negative.   Eyes: Negative.   Respiratory: Negative.   Cardiovascular: Negative.   Gastrointestinal: Negative.   Musculoskeletal: Negative.   Skin: Negative.  Psychiatric/Behavioral: Negative.     Blood pressure 148/98, pulse 80, temperature 97.2 F (36.2 C), resp. rate 20, height 5\' 6"  (1.676 m), weight 190 lb 8 oz (86.41 kg).  Physical Exam Physical Exam  Constitutional: She appears well-developed and well-nourished.  HENT:  Head: Normocephalic and atraumatic.  Nose: Nose normal.  Eyes: EOM are normal. Pupils are equal, round, and reactive to light.  Neck: Normal range of motion. Neck supple.       Posterior hairline 3 cm x 4 cm area over posterior neck sore. 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  No drainable collection.   RTC 1 WEEK. INSTRUCTIONS GIVEN.       [name] A. 03/15/2011, 4:44 PM

## 2011-03-15 NOTE — Patient Instructions (Signed)
Hidradenitis Suppurativa, Sweat Gland Abscess Hidradenitis suppurativa is a long lasting (chronic), uncommon disease of the sweat glands. With this, boil-like lumps and scarring develop in the groin, some times under the arms (axillae), and under the breasts. It may also uncommonly occur behind the ears, in the crease of the buttocks, and around the genitals.  CAUSES The cause is from a blocking of the sweat glands. They then become infected. It may cause drainage and odor. It is not contagious. So it cannot be given to someone else. It most often shows up in puberty (about 57 to 38 years of age). But it may happen much later. It is similar to acne which is a disease of the sweat glands. This condition is slightly more common in African-Americans and women. SYMPTOMS  Hidradenitis usually starts as one or more red, tender, swellings in the groin or under the arms (axilla).   Over a period of hours to days the lesions get larger. They often open to the skin surface, draining clear to yellow-colored fluid.   The infected area heals with scarring.  DIAGNOSIS Your caregiver makes this diagnosis by looking at you. Sometimes cultures (growing germs on plates in the lab) may be taken. This is to see what germ (bacterium) is causing the infection.  TREATMENT & HOME CARE INSTRUCTIONS  Topical germ killing medicine applied to the skin (antibiotics) are the treatment of choice. Antibiotics taken by mouth (systemic) are sometimes needed when the condition is getting worse or is severe.   Avoid tight-fitting clothing which traps moisture in.   Dirt does not cause hidradenitis and it is not caused by poor hygiene.   Involved areas should be cleaned daily using an antibacterial soap. Some patients find that the liquid form of Lever 2000, applied to the involved areas as a lotion after bathing, can help reduce the odor related to this condition.   Sometimes surgery is needed to drain infected areas or remove  scarred tissue. Removal of large amounts of tissue is used only in severe cases.   Birth control pills may be helpful.   Oral retinoids (vitamin A derivatives) for 6 to 12 months which are effective for acne may also help this condition.   Weight loss will improve but not cure hidradenitis. It is made worse by being overweight. But the condition is not caused by being overweight.   This condition is more common in people who have had acne.   It may become worse under stress.  There is no medical cure for hidradenitis. It can be controlled, but not cured. The condition usually continues for years with periods of getting worse and getting better (remission). Document Released: 01/05/2004 Document Re-Released: 03/01/2008 [facility] Patient Information 2011 [place].

## 2011-03-16 NOTE — Telephone Encounter (Signed)
Pt called back complaining of neck stiffness and pain. Pt requesting med that she can take while at work because Vicodin is too strong. Asked Pt how did the tramadol work, Pt indicated that she had call about another med but has yet to hear anything back. Pt informed that tramadol was sent in to pharmacy on Monday. Pt ok info and will see how the med works for her and if she has any problems she will give [place] a call.

## 2011-03-17 ENCOUNTER — Telehealth: Payer: Self-pay | Admitting: *Deleted

## 2011-03-17 NOTE — Telephone Encounter (Addendum)
Pt left VM stating that tramadol or Vicodin is not helping with neck stiffness or pain. Pt c/o not be able to turn her head, stiffness/pain and a headache. Pt indicated that pain and stiffness increases with any movement. Pt notes that med's and the warm showers as suggested by surgeon are only numbing symptoms briefly. Pt would like to know what else can be done. Please advise

## 2011-03-18 NOTE — Telephone Encounter (Signed)
Discuss with patient  

## 2011-03-18 NOTE — Telephone Encounter (Signed)
Should call the surgeon and determine the next step as he wanted her back for f/u.  If pain meds are not helping she may need it to be surgically drained.

## 2011-03-22 NOTE — Assessment & Plan Note (Addendum)
Pt w/ obviously infected area of posterior scalp.  Large kerion like area measures 2"x1".  No fluctuance or firm abscess to drain.  Start abx.  Will refer to surgery for management.  Pt expressed understanding and is in agreement w/ plan.

## 2011-03-22 NOTE — Assessment & Plan Note (Signed)
Chronic problem for pt, deteriorated.  Start ACE.  Follow closely.

## 2011-03-24 LAB — GC/CHLAMYDIA PROBE AMP, GENITAL: GC Probe Amp, Genital: NEGATIVE

## 2011-03-24 LAB — CBC
Hemoglobin: 12.8
Platelets: 312
RDW: 12.8
WBC: 6

## 2011-03-24 LAB — COMPREHENSIVE METABOLIC PANEL
ALT: 11
AST: 20
Calcium: 10.5
GFR calc Af Amer: >60
Potassium: 5
Sodium: 136
Total Protein: 7.1

## 2011-03-24 LAB — WET PREP, GENITAL: Clue Cells Wet Prep HPF POC: NONE SEEN

## 2011-03-24 LAB — DIFFERENTIAL
Basophils Absolute: 0
Lymphocytes Relative: 27
Lymphs Abs: 1.6
Monocytes Absolute: 0.3
Neutro Abs: 4.1

## 2011-03-24 LAB — URINALYSIS, ROUTINE W REFLEX MICROSCOPIC
Bilirubin Urine: NEGATIVE
Leukocytes, UA: NEGATIVE
Nitrite: NEGATIVE
Specific Gravity, Urine: 1.02
Urobilinogen, UA: 0.2
pH: 6

## 2011-03-24 LAB — URINE MICROSCOPIC-ADD ON

## 2011-03-24 LAB — POCT PREGNANCY, URINE
Operator id: 114931
Preg Test, Ur: NEGATIVE

## 2011-03-25 ENCOUNTER — Ambulatory Visit (INDEPENDENT_AMBULATORY_CARE_PROVIDER_SITE_OTHER): Payer: BC Managed Care – PPO | Admitting: Surgery

## 2011-03-25 ENCOUNTER — Encounter (INDEPENDENT_AMBULATORY_CARE_PROVIDER_SITE_OTHER): Payer: Self-pay | Admitting: Surgery

## 2011-03-25 VITALS — BP 120/76 | HR 88 | Temp 98.4°F | Resp 12 | Ht 66.0 in | Wt 180.0 lb

## 2011-03-25 DIAGNOSIS — L732 Hidradenitis suppurativa: Secondary | ICD-10-CM | POA: Insufficient documentation

## 2011-03-25 MED ORDER — DOXYCYCLINE HYCLATE 100 MG PO TABS: 100.0000 mg | ORAL_TABLET | Freq: Two times a day (BID) | ORAL | Status: AC

## 2011-03-25 MED ORDER — OXYCODONE-ACETAMINOPHEN 10-325 MG PO TABS: 1.0000 | ORAL_TABLET | Freq: Four times a day (QID) | ORAL | Status: DC | PRN

## 2011-03-25 NOTE — Progress Notes (Signed)
Chief Complaint  Patient presents with  . Follow-up    RCK NECK MASS    HPI Shannon Caldwell is a 40 y.o. female.  HPI The patient returns today.  She was seen 10 days ago for hidradenitis of her posterior neck. . The area is sore especially when turning her head. l No . Fever or chills. The pain is located in her hairline posterior neck. She is improved on doxycycline.  She still  has some discomfort in the area.  Past Medical History  Diagnosis Date  . Diabetes mellitus, type 2   . Blood transfusion   . GERD (gastroesophageal reflux disease)   . Hyperlipidemia   . Hypertension   . Generalized headaches   . Cervical pain (neck)     due to abscess     Past Surgical History  Procedure Date  . Cholecystectomy   . Appendectomy   . Pancreatic debridgement   . Cytocyst removal   . Hernia repair August 2000, August [id number]    umbilical hernia     Family History  Problem Relation Age of Onset  . Hypertension Mother     grandfather  . Diabetes Mother     grandmother  . Stroke      grandparent  . Lung cancer Maternal Grandfather     Social History History  Substance Use Topics  . Smoking status: Never Smoker   . Smokeless tobacco: Never Used  . Alcohol Use: No    Allergies  Allergen Reactions  . Morphine     Current Outpatient Prescriptions  Medication Sig Dispense Refill  . doxycycline (VIBRA-TABS) 100 MG tablet Take 1 tablet (100 mg total) by mouth 2 (two) times daily.  28 tablet  0  . doxycycline (VIBRA-TABS) 100 MG tablet Take 1 tablet (100 mg total) by mouth 2 (two) times daily.  28 tablet  0  . ferrous sulfate 325 (65 FE) MG tablet Take 1 tablet (325 mg total) by mouth daily with breakfast.  30 tablet  2  . glucose blood (FREESTYLE TEST STRIPS) test strip 1 each by Other route. Use as instructed       . hydrochlorothiazide 25 MG tablet Take 25 mg by mouth daily.        Marland Kitchen ibuprofen (ADVIL,MOTRIN) 800 MG tablet       . insulin aspart (NOVOLOG FLEXPEN) 100  UNIT/ML injection Sliding scale,with meals.  9 mL  3  . insulin detemir (LEVEMIR FLEXPEN) 100 UNIT/ML injection Inject 35 Units into the skin 2 (two) times daily.        . Insulin Pen Needle (NOVOFINE) 32G X 6 MM MISC by Does not apply route as directed.        . Lancets (FREESTYLE) lancets 1 each by Other route. Use as instructed       . lisinopril (PRINIVIL,ZESTRIL) 10 MG tablet Take 1 tablet (10 mg total) by mouth daily.  30 tablet  6  . mometasone (NASONEX) 50 MCG/ACT nasal spray Place 2 sprays into the nose daily.  17 g  2  . oxyCODONE-acetaminophen (PERCOCET) 10-325 MG per tablet Take 1 tablet by mouth every 6 (six) hours as needed for pain.  20 tablet  0  . traMADol (ULTRAM) 50 MG tablet Take 1 tablet (50 mg total) by mouth every 6 (six) hours as needed for pain.  45 tablet  0  . triamcinolone (KENALOG) 0.1 % ointment Apply topically 2 (two) times daily.  90 g  1  Review of Systems Review of Systems  Constitutional: Positive for fatigue. Negative for fever and chills.  HENT: Negative.   Eyes: Negative.   Respiratory: Negative.   Cardiovascular: Negative.   Gastrointestinal: Negative.   Musculoskeletal: Negative.   Skin: Negative.   Psychiatric/Behavioral: Negative.     Blood pressure 120/76, pulse 88, temperature 98.4 F (36.9 C), resp. rate 12, height 5\' 6"  (1.676 m), weight 180 lb (81.647 kg).  Physical Exam Physical Exam  Constitutional: She appears well-developed and well-nourished.  HENT:  Head: Normocephalic and atraumatic.  Nose: Nose normal.  Eyes: EOM are normal. Pupils are equal, round, and reactive to light.  Neck: Normal range of motion. Neck supple.       Posterior hairline 3 cm x 4 cm area over posterior neck sore. No drainable abscess   Less tender.  Less inflamed. Soft.   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  No drainable collection.   RTC 2  WEEKs Continue antibiotics.. INSTRUCTIONS GIVEN.I discussed surgical treatment of this disease with the patient.  She will require excision of this at some point in the near future. I discussed options of excision and primary closure versus excision with packing of the wound. She needs  time to get her affairs in order and does not wish to pursue surgical intervention at this point. Clinically the area is improved. Complications of surgical intervention including bleeding, infection, poor wound healing, hair loss  In  area, pain, and the need for further excision. Continued medical management includes antibiotics. She would like  to continue the antibiotics for another 2 weeks and return at that point in time to discuss her options.       Tyrell Seifer A. 03/25/2011, 11:28 AM

## 2011-03-25 NOTE — Patient Instructions (Signed)
Hidradenitis Suppurativa, Sweat Gland Abscess Hidradenitis suppurativa is a long lasting (chronic), uncommon disease of the sweat glands. With this, boil-like lumps and scarring develop in the groin, some times under the arms (axillae), and under the breasts. It may also uncommonly occur behind the ears, in the crease of the buttocks, and around the genitals.  CAUSES  The cause is from a blocking of the sweat glands. They then become infected. It may cause drainage and odor. It is not contagious. So it cannot be given to someone else. It most often shows up in puberty (about 54 to 38 years of age). But it may happen much later. It is similar to acne which is a disease of the sweat glands. This condition is slightly more common in African-Americans and women. SYMPTOMS   Hidradenitis usually starts as one or more red, tender, swellings in the groin or under the arms (axilla).   Over a period of hours to days the lesions get larger. They often open to the skin surface, draining clear to yellow-colored fluid.   The infected area heals with scarring.  DIAGNOSIS  Your caregiver makes this diagnosis by looking at you. Sometimes cultures (growing germs on plates in the lab) may be taken. This is to see what germ (bacterium) is causing the infection.  TREATMENT   Topical germ killing medicine applied to the skin (antibiotics) are the treatment of choice. Antibiotics taken by mouth (systemic) are sometimes needed when the condition is getting worse or is severe.   Avoid tight-fitting clothing which traps moisture in.   Dirt does not cause hidradenitis and it is not caused by poor hygiene.   Involved areas should be cleaned daily using an antibacterial soap. Some patients find that the liquid form of Lever 2000, applied to the involved areas as a lotion after bathing, can help reduce the odor related to this condition.   Sometimes surgery is needed to drain infected areas or remove scarred tissue.  Removal of large amounts of tissue is used only in severe cases.   Birth control pills may be helpful.   Oral retinoids (vitamin A derivatives) for 6 to 12 months which are effective for acne may also help this condition.   Weight loss will improve but not cure hidradenitis. It is made worse by being overweight. But the condition is not caused by being overweight.   This condition is more common in people who have had acne.   It may become worse under stress.  There is no medical cure for hidradenitis. It can be controlled, but not cured. The condition usually continues for years with periods of getting worse and getting better (remission). Document Released: 01/05/2004 Document Revised: 02/02/2011 Document Reviewed: 01/21/2008 [facility] Patient Information 2012 [place].

## 2011-04-11 ENCOUNTER — Telehealth (INDEPENDENT_AMBULATORY_CARE_PROVIDER_SITE_OTHER): Payer: Self-pay | Admitting: Surgery

## 2011-04-12 NOTE — Telephone Encounter (Signed)
I SPOKE WITH Shannon Caldwell RE WORK NOTE. DR, CORNETT WILL NOT BE IN OFFICE UNTIL Friday AM/ 04-15-11. SHE WILL CALL THEN RE SPECIFICS OF NOTE

## 2011-04-20 ENCOUNTER — Encounter (INDEPENDENT_AMBULATORY_CARE_PROVIDER_SITE_OTHER): Payer: Self-pay | Admitting: Surgery

## 2011-04-20 ENCOUNTER — Ambulatory Visit (INDEPENDENT_AMBULATORY_CARE_PROVIDER_SITE_OTHER): Payer: BC Managed Care – PPO | Admitting: Surgery

## 2011-04-20 VITALS — BP 140/88 | HR 108 | Temp 96.2°F | Resp 18 | Ht 66.0 in | Wt 185.4 lb

## 2011-04-20 DIAGNOSIS — N760 Acute vaginitis: Secondary | ICD-10-CM

## 2011-04-20 DIAGNOSIS — L732 Hidradenitis suppurativa: Secondary | ICD-10-CM

## 2011-04-20 MED ORDER — FLUCONAZOLE 200 MG PO TABS: 200.0000 mg | ORAL_TABLET | Freq: Every day | ORAL | Status: AC

## 2011-04-20 NOTE — Patient Instructions (Signed)
Follow up 3 months

## 2011-04-20 NOTE — Progress Notes (Signed)
Chief Complaint  Patient presents with  . Follow-up    HPI Shannon Caldwell is a 40 y.o. female.  HPI The patient returns today.  She was seen 2 weeks ago for hidradenitis of her posterior neck. . The area is  Much better.  No  Fever or chills. The pain is located in her hairline posterior neck. She is improved on doxycycline.  She has no discomfort.   Past Medical History  Diagnosis Date  . Diabetes mellitus, type 2   . Blood transfusion   . GERD (gastroesophageal reflux disease)   . Hyperlipidemia   . Hypertension   . Generalized headaches   . Cervical pain (neck)     due to abscess     Past Surgical History  Procedure Date  . Cholecystectomy   . Appendectomy   . Pancreatic debridgement   . Cytocyst removal   . Hernia repair August 2000, August [id number]    umbilical hernia     Family History  Problem Relation Age of Onset  . Hypertension Mother     grandfather  . Diabetes Mother     grandmother  . Stroke      grandparent  . Lung cancer Maternal Grandfather     Social History History  Substance Use Topics  . Smoking status: Never Smoker   . Smokeless tobacco: Never Used  . Alcohol Use: No    Allergies  Allergen Reactions  . Morphine     Current Outpatient Prescriptions  Medication Sig Dispense Refill  . ferrous sulfate 325 (65 FE) MG tablet Take 1 tablet (325 mg total) by mouth daily with breakfast.  30 tablet  2  . glucose blood (FREESTYLE TEST STRIPS) test strip 1 each by Other route. Use as instructed       . hydrochlorothiazide 25 MG tablet Take 25 mg by mouth daily.        Marland Kitchen ibuprofen (ADVIL,MOTRIN) 800 MG tablet       . insulin aspart (NOVOLOG FLEXPEN) 100 UNIT/ML injection Sliding scale,with meals.  9 mL  3  . insulin detemir (LEVEMIR FLEXPEN) 100 UNIT/ML injection Inject 35 Units into the skin 2 (two) times daily.        . Insulin Pen Needle (NOVOFINE) 32G X 6 MM MISC by Does not apply route as directed.        . Lancets (FREESTYLE) lancets 1 each  by Other route. Use as instructed       . lisinopril (PRINIVIL,ZESTRIL) 10 MG tablet Take 1 tablet (10 mg total) by mouth daily.  30 tablet  6  . mometasone (NASONEX) 50 MCG/ACT nasal spray Place 2 sprays into the nose daily.  17 g  2  . oxyCODONE-acetaminophen (PERCOCET) 10-325 MG per tablet Take 1 tablet by mouth every 6 (six) hours as needed for pain.  20 tablet  0  . traMADol (ULTRAM) 50 MG tablet Take 1 tablet (50 mg total) by mouth every 6 (six) hours as needed for pain.  45 tablet  0  . triamcinolone (KENALOG) 0.1 % ointment Apply topically 2 (two) times daily.  90 g  1    Review of Systems Review of Systems  Constitutional: Positive for fatigue. Negative for fever and chills.  HENT: Negative.   Eyes: Negative.   Respiratory: Negative.   Cardiovascular: Negative.   Gastrointestinal: Negative.   Musculoskeletal: Negative.   Skin: Negative.   Psychiatric/Behavioral: Negative.     Blood pressure 140/88, pulse 108, temperature 96.2 F (35.7 C),  temperature source Temporal, resp. rate 18, height 5\' 6"  (1.676 m), weight 185 lb 6 oz (84.086 kg).  Physical Exam Physical Exam  Constitutional: She appears well-developed and well-nourished.  HENT:  Head: Normocephalic and atraumatic.  Nose: Nose normal.  Eyes: EOM are normal. Pupils are equal, round, and reactive to light.  Neck: Normal range of motion. Neck supple.       Posterior hairline 3 cm x 4 cm area over posterior neck soft non tender no induration..   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 Vaginal yeast infection    Plan    Return to clinic 3 months.  Diflucan for 5 days.         Shannon Caldwell A. 04/20/2011, 4:18 PM

## 2011-06-01 ENCOUNTER — Ambulatory Visit (INDEPENDENT_AMBULATORY_CARE_PROVIDER_SITE_OTHER): Payer: BC Managed Care – PPO | Admitting: Internal Medicine

## 2011-06-01 ENCOUNTER — Encounter: Payer: Self-pay | Admitting: Internal Medicine

## 2011-06-01 VITALS — BP 114/80 | HR 89 | Temp 99.0°F | Wt 184.0 lb

## 2011-06-01 DIAGNOSIS — J029 Acute pharyngitis, unspecified: Secondary | ICD-10-CM

## 2011-06-01 DIAGNOSIS — J069 Acute upper respiratory infection, unspecified: Secondary | ICD-10-CM

## 2011-06-01 NOTE — Patient Instructions (Signed)
Plain Mucinex for thick secretions ;force NON dairy fluids for next 48 hrs. Use a Neti pot daily as needed for sinus congestion. Zicam Melts or Zinc lozenges ; vitamin C 2000 mg daily; & Echinacea for 4-7 days. Report fever, exudate("pus") or progressive pain.  Nasonex 1 spray in each nostril twice a day as needed. Use the "crossover" technique as discussed

## 2011-06-01 NOTE — Progress Notes (Signed)
  Subjective:    Patient ID: Shannon Caldwell, female    DOB: 05-Oct-1970, 40 y.o.   MRN: [id number]  HPI Respiratory tract infection Onset/symptoms:12/25 as sneezing  Exposures (illness/environmental/extrinsic):clients in facility ill; she took Flu shot in 10/12 Progression of symptoms:ST & cough Treatments/response:none Present symptoms: Fever/chills/sweats:sweats, chills Frontal headache:yes Facial pain:no Nasal purulence: no Dental pain:upper teeth Lymphadenopathy:no Wheezing/shortness of breath:no Cough/sputum/hemoptysis:no Past medical history: Seasonal allergies : no/asthma:no Smoking history:never           Review of Systems     Objective:   Physical Exam General appearance is of good health and nourishment; no acute distress or increased work of breathing is present.  No  lymphadenopathy about the head, neck, or axilla noted. Nasal & upper lip jewelry  Eyes: No conjunctival inflammation or lid edema is present.   Ears:  External ear exam shows no significant lesions or deformities.  Otoscopic examination reveals clear canals, tympanic membranes are intact bilaterally without bulging, retraction, inflammation or discharge.  Nose:  External nasal examination shows no deformity or inflammation. Nasal mucosa are dry  without lesions or exudates. No septal dislocation .No obstruction to airflow.   Oral exam: Dental hygiene is good; lips and gums are healthy appearing.There is no oropharyngeal erythema or exudate noted. Braces   Neck:    Supple with full range of motion without pain.   Heart:  Normal rate and regular rhythm. S1 and S2 normal without gallop, murmur, click, rub or other extra sounds.   Lungs:Chest clear to auscultation; no wheezes, rhonchi,rales ,or rubs present.No increased work of breathing.    Extremities:  No cyanosis, edema, or clubbing  noted    Skin: Warm & dry          Assessment & Plan:   #1 upper screw tract infection present < 24  hours. Beta strep is negative.  Plan: See orders and recommendations

## 2011-06-29 ENCOUNTER — Encounter (INDEPENDENT_AMBULATORY_CARE_PROVIDER_SITE_OTHER): Payer: Self-pay | Admitting: Surgery

## 2011-07-28 ENCOUNTER — Encounter (INDEPENDENT_AMBULATORY_CARE_PROVIDER_SITE_OTHER): Payer: BC Managed Care – PPO | Admitting: Surgery

## 2011-08-12 ENCOUNTER — Ambulatory Visit (INDEPENDENT_AMBULATORY_CARE_PROVIDER_SITE_OTHER): Payer: BC Managed Care – PPO | Admitting: Family

## 2011-08-12 ENCOUNTER — Encounter: Payer: Self-pay | Admitting: Family

## 2011-08-12 VITALS — BP 146/86 | HR 97 | Temp 98.1°F | Resp 16 | Wt 184.1 lb

## 2011-08-12 DIAGNOSIS — J029 Acute pharyngitis, unspecified: Secondary | ICD-10-CM

## 2011-08-12 DIAGNOSIS — J329 Chronic sinusitis, unspecified: Secondary | ICD-10-CM | POA: Insufficient documentation

## 2011-08-12 MED ORDER — CEFUROXIME AXETIL 500 MG PO TABS: 500.0000 mg | ORAL_TABLET | Freq: Two times a day (BID) | ORAL | Status: AC

## 2011-08-12 NOTE — Assessment & Plan Note (Signed)
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  Rapid strep is negative.  Will plan to treat with ceftin.

## 2011-08-12 NOTE — Progress Notes (Signed)
Subjective:    Patient ID: Shannon Caldwell, female    DOB: [birth year], 41 y.o.   MRN: [id number]  HPI  Ms.  Caldwell is a 41 yr old female who presents today with chief complaint of headache.  Headache started 1 week ago.  Throbbing, frontal and sides of forehead.  Mild post nasal drip.  Nasal discharge was initial green/brown, now clear.  Has associated sore throat x 1 week and ear pain.  Dry cough. Had fever 2 days ago 102, this AM felt some better after nyquil.       Review of Systems See HPI  Past Medical History  Diagnosis Date  . Diabetes mellitus, type 2   . Blood transfusion   . GERD (gastroesophageal reflux disease)   . Hyperlipidemia   . Hypertension   . Generalized headaches   . Cervical pain (neck)     due to abscess     History   Social History  . Marital Status: Divorced    Spouse Name: N/A    Number of Children: 3  . Years of Education: N/A   Occupational History  . 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 [employer]     during week   Social History Main Topics  . Smoking status: Never Smoker   . Smokeless tobacco: Never Used  . Alcohol Use: No  . Drug Use: No  . Sexually Active: Not on file   Other Topics Concern  . Not on file   Social History Narrative   Daughter 64, son 9, son 63Grandson lives with Patient    Past Surgical History  Procedure Date  . Cholecystectomy   . Appendectomy   . Pancreatic debridgement   . Cytocyst removal   . Hernia repair August 2000, August [id number]    umbilical hernia     Family History  Problem Relation Age of Onset  . Hypertension Mother     grandfather  . Diabetes Mother     grandmother  . Stroke      grandparent  . Lung cancer Maternal Grandfather     Allergies  Allergen Reactions  . Morphine     hives    Current Outpatient Prescriptions on File Prior to Visit  Medication Sig Dispense Refill  . ferrous sulfate 325 (65 FE) MG tablet Take 1 tablet (325 mg total)  by mouth daily with breakfast.  30 tablet  2  . glucose blood (FREESTYLE TEST STRIPS) test strip 1 each by Other route. Use as instructed       . hydrochlorothiazide 25 MG tablet Take 25 mg by mouth daily.        Marland Kitchen ibuprofen (ADVIL,MOTRIN) 800 MG tablet       . insulin aspart (NOVOLOG FLEXPEN) 100 UNIT/ML injection Sliding scale,with meals.  9 mL  3  . insulin detemir (LEVEMIR FLEXPEN) 100 UNIT/ML injection Inject 35 Units into the skin 2 (two) times daily.        . Insulin Pen Needle (NOVOFINE) 32G X 6 MM MISC by Does not apply route as directed.        . Lancets (FREESTYLE) lancets 1 each by Other route. Use as instructed       . lisinopril (PRINIVIL,ZESTRIL) 10 MG tablet Take 1 tablet (10 mg total) by mouth daily.  30 tablet  6  . mometasone (NASONEX) 50 MCG/ACT nasal spray Place 2 sprays into the nose daily.  17 g  2  . oxyCODONE-acetaminophen (PERCOCET) 10-325 MG per tablet Take 1 tablet by mouth every 6 (six) hours as needed for pain.  20 tablet  0  . traMADol (ULTRAM) 50 MG tablet Take 1 tablet (50 mg total) by mouth every 6 (six) hours as needed for pain.  45 tablet  0  . triamcinolone (KENALOG) 0.1 % ointment Apply topically 2 (two) times daily.  90 g  1    BP 146/86  Pulse 97  Temp(Src) 98.1 F (36.7 C) (Oral)  Resp 16  Wt 184 lb 1.9 oz (83.516 kg)  SpO2 99%       Objective:   Physical Exam  Constitutional: She appears well-developed and well-nourished. No distress.  HENT:  Head: Normocephalic and atraumatic.  Right Ear: Tympanic membrane and ear canal normal.  Left Ear: Tympanic membrane and ear canal normal.  Mouth/Throat: No posterior oropharyngeal edema or posterior oropharyngeal erythema.  Cardiovascular: Normal rate and regular rhythm.   No murmur heard. Pulmonary/Chest: Effort normal and breath sounds normal. No respiratory distress. She has no wheezes. She has no rales. She exhibits no tenderness.  Lymphadenopathy:    She has no cervical adenopathy.  Skin:  Skin is warm and dry.  Psychiatric: She has a normal mood and affect. Her behavior is normal. Judgment and thought content normal.          Assessment & Plan:

## 2011-08-12 NOTE — Patient Instructions (Signed)

## 2011-08-26 ENCOUNTER — Encounter (HOSPITAL_BASED_OUTPATIENT_CLINIC_OR_DEPARTMENT_OTHER): Payer: Self-pay

## 2011-08-26 ENCOUNTER — Observation Stay (HOSPITAL_BASED_OUTPATIENT_CLINIC_OR_DEPARTMENT_OTHER)
Admission: EM | Admit: 2011-08-26 | Discharge: 2011-08-27 | Disposition: A | Discharge status: Home / Self Care | Payer: BC Managed Care – PPO | Attending: Internal Medicine | Admitting: Internal Medicine

## 2011-08-26 ENCOUNTER — Emergency Department (INDEPENDENT_AMBULATORY_CARE_PROVIDER_SITE_OTHER): Payer: BC Managed Care – PPO

## 2011-08-26 ENCOUNTER — Other Ambulatory Visit: Payer: Self-pay

## 2011-08-26 DIAGNOSIS — L0291 Cutaneous abscess, unspecified: Secondary | ICD-10-CM

## 2011-08-26 DIAGNOSIS — L732 Hidradenitis suppurativa: Secondary | ICD-10-CM

## 2011-08-26 DIAGNOSIS — R0602 Shortness of breath: Secondary | ICD-10-CM | POA: Insufficient documentation

## 2011-08-26 DIAGNOSIS — N920 Excessive and frequent menstruation with regular cycle: Secondary | ICD-10-CM

## 2011-08-26 DIAGNOSIS — I10 Essential (primary) hypertension: Secondary | ICD-10-CM | POA: Diagnosis present

## 2011-08-26 DIAGNOSIS — R0789 Other chest pain: Secondary | ICD-10-CM | POA: Diagnosis present

## 2011-08-26 DIAGNOSIS — E119 Type 2 diabetes mellitus without complications: Secondary | ICD-10-CM | POA: Insufficient documentation

## 2011-08-26 DIAGNOSIS — K869 Disease of pancreas, unspecified: Secondary | ICD-10-CM

## 2011-08-26 DIAGNOSIS — B3731 Acute candidiasis of vulva and vagina: Secondary | ICD-10-CM

## 2011-08-26 DIAGNOSIS — R21 Rash and other nonspecific skin eruption: Secondary | ICD-10-CM

## 2011-08-26 DIAGNOSIS — J019 Acute sinusitis, unspecified: Secondary | ICD-10-CM

## 2011-08-26 DIAGNOSIS — J329 Chronic sinusitis, unspecified: Secondary | ICD-10-CM

## 2011-08-26 DIAGNOSIS — K219 Gastro-esophageal reflux disease without esophagitis: Secondary | ICD-10-CM | POA: Insufficient documentation

## 2011-08-26 DIAGNOSIS — R079 Chest pain, unspecified: Secondary | ICD-10-CM

## 2011-08-26 DIAGNOSIS — Z794 Long term (current) use of insulin: Secondary | ICD-10-CM | POA: Insufficient documentation

## 2011-08-26 DIAGNOSIS — D72829 Elevated white blood cell count, unspecified: Secondary | ICD-10-CM | POA: Insufficient documentation

## 2011-08-26 DIAGNOSIS — F329 Major depressive disorder, single episode, unspecified: Secondary | ICD-10-CM

## 2011-08-26 DIAGNOSIS — R002 Palpitations: Secondary | ICD-10-CM

## 2011-08-26 DIAGNOSIS — F3289 Other specified depressive episodes: Secondary | ICD-10-CM | POA: Diagnosis present

## 2011-08-26 DIAGNOSIS — H6690 Otitis media, unspecified, unspecified ear: Secondary | ICD-10-CM | POA: Insufficient documentation

## 2011-08-26 DIAGNOSIS — Z90411 Acquired partial absence of pancreas: Secondary | ICD-10-CM

## 2011-08-26 DIAGNOSIS — L0390 Cellulitis, unspecified: Secondary | ICD-10-CM

## 2011-08-26 DIAGNOSIS — B373 Candidiasis of vulva and vagina: Secondary | ICD-10-CM

## 2011-08-26 DIAGNOSIS — Q8909 Congenital malformations of spleen: Secondary | ICD-10-CM | POA: Insufficient documentation

## 2011-08-26 DIAGNOSIS — R42 Dizziness and giddiness: Secondary | ICD-10-CM

## 2011-08-26 LAB — CBC
HCT: 30.3 % — ABNORMAL LOW (ref 36.0–46.0)
Hemoglobin: 9.9 g/dL — ABNORMAL LOW (ref 12.0–15.0)
MCH: 26.1 pg (ref 26.0–34.0)
MCV: 79.9 fL (ref 78.0–100.0)
RBC: 3.79 MIL/uL — ABNORMAL LOW (ref 3.87–5.11)

## 2011-08-26 LAB — BASIC METABOLIC PANEL
BUN: 8 mg/dL (ref 6–23)
CO2: 28 mEq/L (ref 19–32)
Chloride: 98 mEq/L (ref 96–112)
Creatinine, Ser: 0.6 mg/dL (ref 0.50–1.10)
Glucose, Bld: 130 mg/dL — ABNORMAL HIGH (ref 70–99)

## 2011-08-26 LAB — DIFFERENTIAL
Eosinophils Absolute: 0 10*3/uL (ref 0.0–0.7)
Eosinophils Relative: 0 % (ref 0–5)
Lymphs Abs: 1.8 10*3/uL (ref 0.7–4.0)
Monocytes Absolute: 0.3 10*3/uL (ref 0.1–1.0)
Monocytes Relative: 5 % (ref 3–12)

## 2011-08-26 LAB — CARDIAC PANEL(CRET KIN+CKTOT+MB+TROPI): Relative Index: 2.9 — ABNORMAL HIGH (ref 0.0–2.5)

## 2011-08-26 MED ORDER — HYDROMORPHONE HCL PF 1 MG/ML IJ SOLN
1.0000 mg | Freq: Once | INTRAMUSCULAR | Status: AC
Start: 2011-08-26 — End: 2011-08-26
  Administered 2011-08-26: 1 mg via INTRAVENOUS
  Filled 2011-08-26: qty 1

## 2011-08-26 MED ORDER — ACETAMINOPHEN 500 MG PO TABS
ORAL_TABLET | ORAL | Status: AC
  Administered 2011-08-26: 1000 mg
  Filled 2011-08-26: qty 2

## 2011-08-26 MED ORDER — SODIUM CHLORIDE 0.9 % IV SOLN
INTRAVENOUS | Status: AC
  Administered 2011-08-26: via INTRAVENOUS

## 2011-08-26 MED ORDER — ASPIRIN 81 MG PO CHEW
324.0000 mg | CHEWABLE_TABLET | Freq: Once | ORAL | Status: AC
  Administered 2011-08-26: 324 mg via ORAL
  Filled 2011-08-26: qty 4

## 2011-08-26 MED ORDER — ONDANSETRON HCL 4 MG/2ML IJ SOLN
4.0000 mg | Freq: Once | INTRAMUSCULAR | Status: AC
  Administered 2011-08-26: 4 mg via INTRAVENOUS
  Filled 2011-08-26: qty 2

## 2011-08-26 NOTE — ED Provider Notes (Signed)
History     CSN: [id number]  Arrival date & time 08/26/11  [name]   First MD Initiated Contact with Patient 08/26/11 1955      Chief Complaint  Patient presents with  . Chest Pain    (Consider location/radiation/quality/duration/timing/severity/associated sxs/prior treatment) HPI Comments: PT woke up with a headache this am.  When she got up, around 9:30, bent over and felt heart racing, was associtated with some SOB and tightness in left chest, these symptoms resolved when she stood up after a few minutes.  Happened two more times during the day.  Now has some pain to left shoulder, but thinks this may be from moving boxes at work this afternoon.  No leg pain or swelling.  No cough/congestion, or other recent illnesses.  No fevers.  No hx of heart problems in the past.  Patient is a 41 y.o. female presenting with chest pain. The history is provided by the patient.  Chest Pain Episode onset: this am. Chest pain occurs intermittently. The chest pain is unchanged. Primary symptoms include shortness of breath, palpitations and dizziness. Pertinent negatives for primary symptoms include no fever, no fatigue, no cough, no abdominal pain, no nausea and no vomiting.  The palpitations also occurred with dizziness and shortness of breath.   Dizziness does not occur with nausea, vomiting, weakness or diaphoresis.  Pertinent negatives for associated symptoms include no diaphoresis, no numbness and no weakness.     Past Medical History  Diagnosis Date  . Diabetes mellitus, type 2   . Blood transfusion   . GERD (gastroesophageal reflux disease)   . Hyperlipidemia   . Hypertension   . Generalized headaches   . Cervical pain (neck)     due to abscess     Past Surgical History  Procedure Date  . Cholecystectomy   . Appendectomy   . Pancreatic debridgement   . Cytocyst removal   . Hernia repair August 2000, August [id number]    umbilical hernia     Family History  Problem Relation Age of  Onset  . Hypertension Mother     grandfather  . Diabetes Mother     grandmother  . Stroke      grandparent  . Lung cancer Maternal Grandfather     History  Substance Use Topics  . Smoking status: Never Smoker   . Smokeless tobacco: Never Used  . Alcohol Use: No    OB History    Grav Para Term Preterm Abortions TAB SAB Ect Mult Living                  Review of Systems  Constitutional: Negative for fever, chills, diaphoresis and fatigue.  HENT: Negative for congestion, rhinorrhea and sneezing.   Eyes: Negative.   Respiratory: Positive for shortness of breath. Negative for cough and chest tightness.   Cardiovascular: Positive for chest pain and palpitations. Negative for leg swelling.  Gastrointestinal: Negative for nausea, vomiting, abdominal pain, diarrhea and blood in stool.  Genitourinary: Negative for frequency, hematuria, flank pain and difficulty urinating.  Musculoskeletal: Negative for back pain and arthralgias.  Skin: Negative for rash.  Neurological: Positive for dizziness. Negative for speech difficulty, weakness, numbness and headaches.    Allergies  Morphine  Home Medications   Current Outpatient Rx  Name Route Sig Dispense Refill  . FERROUS SULFATE 325 (65 FE) MG PO TABS Oral Take 1 tablet (325 mg total) by mouth daily with breakfast. 30 tablet 2  . GLUCOSE BLOOD VI STRP Other  1 each by Other route. Use as instructed     . HYDROCHLOROTHIAZIDE 25 MG PO TABS Oral Take 25 mg by mouth daily.      . INSULIN ASPART 100 UNIT/ML [place] SOLN  Sliding scale,with meals. 9 mL 3  . INSULIN DETEMIR 100 UNIT/ML [place] SOLN Subcutaneous Inject 35 Units into the skin 2 (two) times daily.      . INSULIN PEN NEEDLE 32G X 6 MM MISC Does not apply by Does not apply route as directed.      Marland Kitchen FREESTYLE LANCETS MISC Other 1 each by Other route. Use as instructed     . LISINOPRIL 10 MG PO TABS Oral Take 1 tablet (10 mg total) by mouth daily. 30 tablet 6  . OXYCODONE-ACETAMINOPHEN  10-325 MG PO TABS Oral Take 1 tablet by mouth every 6 (six) hours as needed for pain. 20 tablet 0  . TRAMADOL HCL 50 MG PO TABS Oral Take 1 tablet (50 mg total) by mouth every 6 (six) hours as needed for pain. 45 tablet 0    BP 148/84  Pulse 76  Temp(Src) 98 F (36.7 C) (Oral)  Resp 16  Ht 5\' 5"  (1.651 m)  Wt 184 lb (83.462 kg)  BMI 30.62 kg/m2  SpO2 98%  LMP 08/17/2011  Physical Exam  Constitutional: She is oriented to person, place, and time. She appears well-developed and well-nourished.  HENT:  Head: Normocephalic and atraumatic.  Eyes: Pupils are equal, round, and reactive to light.  Neck: Normal range of motion. Neck supple.  Cardiovascular: Normal rate, regular rhythm and normal heart sounds.   Pulmonary/Chest: Effort normal and breath sounds normal. No respiratory distress. She has no wheezes. She has no rales. She exhibits tenderness.       TTP left trapezius muscle  Abdominal: Soft. Bowel sounds are normal. There is no tenderness. There is no rebound and no guarding.  Musculoskeletal: Normal range of motion. She exhibits no edema.  Lymphadenopathy:    She has no cervical adenopathy.  Neurological: She is alert and oriented to person, place, and time.  Skin: Skin is warm and dry. No rash noted.  Psychiatric: She has a normal mood and affect.    ED Course  Procedures (including critical care time)  Results for orders placed during the hospital encounter of 08/26/11  CBC      Component Value Range   WBC 6.3  4.0 - 10.5 (K/uL)   RBC 3.79 (*) 3.87 - 5.11 (MIL/uL)   Hemoglobin 9.9 (*) 12.0 - 15.0 (g/dL)   HCT 30.3 (*) 36.0 - 46.0 (%)   MCV 79.9  78.0 - 100.0 (fL)   MCH 26.1  26.0 - 34.0 (pg)   MCHC 32.7  30.0 - 36.0 (g/dL)   RDW 14.4  11.5 - 15.5 (%)   Platelets 386  150 - 400 (K/uL)  DIFFERENTIAL      Component Value Range   Neutrophils Relative 65  43 - 77 (%)   Neutro Abs 4.1  1.7 - 7.7 (K/uL)   Lymphocytes Relative 29  12 - 46 (%)   Lymphs Abs 1.8  0.7 -  4.0 (K/uL)   Monocytes Relative 5  3 - 12 (%)   Monocytes Absolute 0.3  0.1 - 1.0 (K/uL)   Eosinophils Relative 0  0 - 5 (%)   Eosinophils Absolute 0.0  0.0 - 0.7 (K/uL)   Basophils Relative 0  0 - 1 (%)   Basophils Absolute 0.0  0.0 - 0.1 (K/uL)  BASIC METABOLIC PANEL      Component Value Range   Sodium 135  135 - 145 (mEq/L)   Potassium 3.5  3.5 - 5.1 (mEq/L)   Chloride 98  96 - 112 (mEq/L)   CO2 28  19 - 32 (mEq/L)   Glucose, Bld 130 (*) 70 - 99 (mg/dL)   BUN 8  6 - 23 (mg/dL)   Creatinine, Ser 0.60  0.50 - 1.10 (mg/dL)   Calcium 9.3  8.4 - 10.5 (mg/dL)   GFR calc non Af Amer >90  >90 (mL/min)   GFR calc Af Amer >90  >90 (mL/min)  CARDIAC PANEL(CRET KIN+CKTOT+MB+TROPI)      Component Value Range   Total CK 133  7 - 177 (U/L)   CK, MB 3.9  0.3 - 4.0 (ng/mL)   Troponin I <0.30  <0.30 (ng/mL)   Relative Index 2.9 (*) 0.0 - 2.5    Dg Chest 2 View  08/26/2011  *RADIOLOGY REPORT*  Clinical Data: Left-sided chest pain.  Shortness of breath. Diabetes and hypertension.  CHEST - 2 VIEW  Comparison: 04/10/2010  Findings: Heart size is normal.  Both lungs are clear.  No evidence of pleural effusion.  No mass or lymphadenopathy identified.  IMPRESSION: No active cardiopulmonary disease.  Original Report Authenticated By: Marlaine Hind, M.D.    Date: 08/26/2011  Rate: 80  Rhythm: normal sinus rhythm  QRS Axis: normal  Intervals: normal  ST/T Wave abnormalities: normal  Conduction Disutrbances:none  Narrative Interpretation:   Old EKG Reviewed: none available     1. Chest pain       MDM  Pt with multiple risk factors, consulted [facility] who will admit pt        Malvin Johns, MD 08/26/11 2337

## 2011-08-26 NOTE — ED Notes (Signed)
Into discuss plan of care with patient. She reports that the dilaudid and oxygen is improving her CP at this time, now reports about 3/10, will reassess pt again in about 10 minutes and notify admitting/EDP if pt is still having CP for change in bed status

## 2011-08-26 NOTE — ED Notes (Signed)
Patient returned from X-ray 

## 2011-08-26 NOTE — ED Notes (Signed)
CP intermittent since 930am-felt worse-2 episodes of heart beating fast with sob when bends over-CP with radiation to left arm and left shoulder/neck at present

## 2011-08-27 ENCOUNTER — Encounter (HOSPITAL_COMMUNITY): Payer: Self-pay | Admitting: *Deleted

## 2011-08-27 ENCOUNTER — Encounter: Payer: Self-pay | Admitting: Internal Medicine

## 2011-08-27 DIAGNOSIS — R0789 Other chest pain: Secondary | ICD-10-CM | POA: Diagnosis present

## 2011-08-27 DIAGNOSIS — R002 Palpitations: Secondary | ICD-10-CM | POA: Diagnosis present

## 2011-08-27 LAB — CBC
HCT: 31 % — ABNORMAL LOW (ref 36.0–46.0)
MCH: 26 pg (ref 26.0–34.0)
MCHC: 32.3 g/dL (ref 30.0–36.0)
MCV: 80.7 fL (ref 78.0–100.0)
RDW: 14.8 % (ref 11.5–15.5)

## 2011-08-27 LAB — CARDIAC PANEL(CRET KIN+CKTOT+MB+TROPI)
CK, MB: 2.8 ng/mL (ref 0.3–4.0)
CK, MB: 2.6 ng/mL (ref 0.3–4.0)
CK, MB: 2.1 ng/mL (ref 0.3–4.0)
Total CK: 71 U/L (ref 7–177)
Troponin I: <0.3 ng/mL (ref <0.30)

## 2011-08-27 LAB — BASIC METABOLIC PANEL
BUN: 7 mg/dL (ref 6–23)
CO2: 25 mEq/L (ref 19–32)
Chloride: 96 mEq/L (ref 96–112)
Creatinine, Ser: 0.51 mg/dL (ref 0.50–1.10)

## 2011-08-27 LAB — TSH: TSH: 1.603 u[IU]/mL (ref 0.350–4.500)

## 2011-08-27 LAB — PHOSPHORUS: Phosphorus: 3.3 mg/dL (ref 2.3–4.6)

## 2011-08-27 LAB — GLUCOSE, CAPILLARY

## 2011-08-27 LAB — URINALYSIS, ROUTINE W REFLEX MICROSCOPIC
Bilirubin Urine: NEGATIVE
Glucose, UA: NEGATIVE mg/dL
Hgb urine dipstick: NEGATIVE
Ketones, ur: NEGATIVE mg/dL
Nitrite: NEGATIVE
Specific Gravity, Urine: 1.009 (ref 1.005–1.030)
pH: 6 (ref 5.0–8.0)

## 2011-08-27 MED ORDER — INSULIN ASPART 100 UNIT/ML ~~LOC~~ SOLN
0.0000 [IU] | Freq: Three times a day (TID) | SUBCUTANEOUS | Status: DC
  Administered 2011-08-27: 3 [IU] via SUBCUTANEOUS
  Administered 2011-08-27: 1 [IU] via SUBCUTANEOUS

## 2011-08-27 MED ORDER — INSULIN ASPART 100 UNIT/ML ~~LOC~~ SOLN: 0.0000 [IU] | Freq: Every day | SUBCUTANEOUS | Status: DC

## 2011-08-27 MED ORDER — ACETAMINOPHEN 650 MG RE SUPP: 650.0000 mg | Freq: Four times a day (QID) | RECTAL | Status: DC | PRN

## 2011-08-27 MED ORDER — ASPIRIN EC 81 MG PO TBEC
81.0000 mg | DELAYED_RELEASE_TABLET | Freq: Every day | ORAL | Status: DC
  Administered 2011-08-27: 81 mg via ORAL
  Filled 2011-08-27: qty 1

## 2011-08-27 MED ORDER — ASPIRIN 81 MG PO TBEC: 81.0000 mg | DELAYED_RELEASE_TABLET | Freq: Every day | ORAL | Status: DC

## 2011-08-27 MED ORDER — OXYCODONE-ACETAMINOPHEN 5-325 MG PO TABS: 1.0000 | ORAL_TABLET | Freq: Four times a day (QID) | ORAL | Status: DC | PRN

## 2011-08-27 MED ORDER — HYDROMORPHONE HCL PF 1 MG/ML IJ SOLN: 1.0000 mg | INTRAMUSCULAR | Status: DC | PRN

## 2011-08-27 MED ORDER — ENOXAPARIN SODIUM 40 MG/0.4ML ~~LOC~~ SOLN
40.0000 mg | Freq: Every day | SUBCUTANEOUS | Status: DC
  Administered 2011-08-27: 40 mg via SUBCUTANEOUS
  Filled 2011-08-27: qty 0.4

## 2011-08-27 MED ORDER — SODIUM CHLORIDE 0.9 % IJ SOLN
3.0000 mL | Freq: Two times a day (BID) | INTRAMUSCULAR | Status: DC
  Administered 2011-08-27: 3 mL via INTRAVENOUS

## 2011-08-27 MED ORDER — NITROGLYCERIN 0.4 MG SL SUBL
0.4000 mg | SUBLINGUAL_TABLET | SUBLINGUAL | Status: DC | PRN
  Administered 2011-08-26 – 2011-08-27 (×3): 0.4 mg via SUBLINGUAL
  Filled 2011-08-27: qty 25

## 2011-08-27 MED ORDER — INSULIN DETEMIR 100 UNIT/ML ~~LOC~~ SOLN
35.0000 [IU] | Freq: Two times a day (BID) | SUBCUTANEOUS | Status: DC
  Administered 2011-08-27 (×2): 35 [IU] via SUBCUTANEOUS
  Filled 2011-08-27: qty 3

## 2011-08-27 MED ORDER — SODIUM CHLORIDE 0.9 % IV SOLN
INTRAVENOUS | Status: DC
  Administered 2011-08-27: 06:00:00 via INTRAVENOUS

## 2011-08-27 MED ORDER — ALUM & MAG HYDROXIDE-SIMETH 200-200-20 MG/5ML PO SUSP: 30.0000 mL | Freq: Four times a day (QID) | ORAL | Status: DC | PRN

## 2011-08-27 MED ORDER — HYDROCHLOROTHIAZIDE 25 MG PO TABS
25.0000 mg | ORAL_TABLET | Freq: Every day | ORAL | Status: DC
  Administered 2011-08-27: 25 mg via ORAL
  Filled 2011-08-27: qty 1

## 2011-08-27 MED ORDER — LISINOPRIL 10 MG PO TABS
10.0000 mg | ORAL_TABLET | Freq: Every day | ORAL | Status: DC
  Administered 2011-08-27: 10 mg via ORAL
  Filled 2011-08-27: qty 1

## 2011-08-27 MED ORDER — ONDANSETRON HCL 4 MG/2ML IJ SOLN
4.0000 mg | Freq: Four times a day (QID) | INTRAMUSCULAR | Status: DC | PRN
  Administered 2011-08-27: 4 mg via INTRAVENOUS
  Filled 2011-08-27: qty 2

## 2011-08-27 MED ORDER — ACETAMINOPHEN 325 MG PO TABS
650.0000 mg | ORAL_TABLET | Freq: Four times a day (QID) | ORAL | Status: DC | PRN
  Administered 2011-08-27: 650 mg via ORAL
  Filled 2011-08-27: qty 2

## 2011-08-27 MED ORDER — OXYCODONE-ACETAMINOPHEN 10-325 MG PO TABS: 1.0000 | ORAL_TABLET | Freq: Four times a day (QID) | ORAL | Status: DC | PRN

## 2011-08-27 MED ORDER — OXYCODONE HCL 5 MG PO TABS: 5.0000 mg | ORAL_TABLET | Freq: Four times a day (QID) | ORAL | Status: DC | PRN

## 2011-08-27 MED ORDER — TRAMADOL HCL 50 MG PO TABS: 50.0000 mg | ORAL_TABLET | Freq: Four times a day (QID) | ORAL | Status: DC | PRN

## 2011-08-27 MED ORDER — PANTOPRAZOLE SODIUM 40 MG PO TBEC
40.0000 mg | DELAYED_RELEASE_TABLET | Freq: Every day | ORAL | Status: DC
  Administered 2011-08-27: 40 mg via ORAL
  Filled 2011-08-27: qty 1

## 2011-08-27 MED ORDER — FERROUS SULFATE 325 (65 FE) MG PO TABS
325.0000 mg | ORAL_TABLET | Freq: Every day | ORAL | Status: DC
  Administered 2011-08-27: 325 mg via ORAL
  Filled 2011-08-27 (×2): qty 1

## 2011-08-27 NOTE — ED Notes (Signed)
Into reassess pt discomfort. Pt says " I am not having any pain, I am just having the palpitations"  Vss, remains in NSR. Awaiting transport to 2000

## 2011-08-27 NOTE — H&P (Signed)
History and Physical       Hospital Admission Note Date: 08/27/2011  Patient name: Shannon Caldwell Medical record number: [id number] Date of birth: 08-04-1970 Age: 41 y.o. Gender: female PCP: Annye Asa, MD, MD  Attending physician: Leana Gamer, MD   Chief Complaint:  Chest pain with palpitations today  HPI: Patient is a 41 year old female with history of diabetes on insulin, GERD, hypertension, hyperlipidemia initially presented to [place] ED with atypical chest pain and palpitations. Patient was transferred to [facility] for further workup. History was obtained from the patient who stated that she woke up this morning, around 9:30 AM she bent over to pick up something and felt her heart racing. She felt some tightness in her chest, some choking in her throat but the symptoms resolved as soon as she stood up after a few minutes. Patient then went to work and around 12:30 she had another similar episode, again on bending down. Patient also had pain to her left shoulder and felt that may be from moving boxes at work. Patient had another symptom around 5:30 PM which concerned her and she presented to the ED for further workup. Patient had no radiation of the atypical chest tightness, patient however has some tenderness on her left shoulder which she attributes to possibly 'pulled a muscle' while moving the boxes. At the time of my encounter patient had no chest pain or palpitations. Patient denies any diaphoresis, nausea or vomiting, numbness or tingling.  Review of Systems:  Constitutional: Denies fever, chills, diaphoresis, appetite change and fatigue.  HEENT: Denies photophobia, eye pain, redness, hearing loss, ear pain, congestion, sore throat, rhinorrhea, sneezing, mouth sores, trouble swallowing, neck pain, neck stiffness and tinnitus.   Respiratory: See history of present illness  Cardiovascular: See history  of present illness  Gastrointestinal: Denies nausea, vomiting, abdominal pain, diarrhea, constipation, blood in stool and abdominal distention.  Genitourinary: Denies dysuria, urgency, frequency, hematuria, flank pain and difficulty urinating.  Musculoskeletal: Denies myalgias, back pain, joint swelling, arthralgias and gait problem.  Skin: Denies pallor, rash and wound.  Neurological: Denies dizziness, seizures, syncope, weakness, light-headedness, numbness and headaches.  Hematological: Denies adenopathy. Easy bruising, personal or family bleeding history  Psychiatric/Behavioral: Denies suicidal ideation, mood changes, confusion, nervousness, sleep disturbance and agitation  Past Medical History: Past Medical History  Diagnosis Date  . Diabetes mellitus, type 2   . Blood transfusion   . GERD (gastroesophageal reflux disease)   . Hyperlipidemia   . Hypertension   . Generalized headaches   . Cervical pain (neck)     due to abscess    Past Surgical History  Procedure Date  . Cholecystectomy   . Appendectomy   . Pancreatic debridgement   . Cytocyst removal   . Hernia repair August 2000, August [id number]    umbilical hernia     Medications: Prior to Admission medications   Medication Sig Start Date End Date Taking? Authorizing Provider  ferrous sulfate 325 (65 FE) MG tablet Take 1 tablet (325 mg total) by mouth daily with breakfast. 11/26/10 11/26/11 Yes Midge Minium, MD  glucose blood (FREESTYLE TEST STRIPS) test strip 1 each by Other route. Use as instructed    Yes Historical Provider, MD  hydrochlorothiazide 25 MG tablet Take 25 mg by mouth daily.     Yes Historical Provider, MD  insulin aspart (NOVOLOG FLEXPEN) 100 UNIT/ML injection Sliding scale,with meals. 08/30/10  Yes Midge Minium, MD  insulin detemir (LEVEMIR FLEXPEN) 100 UNIT/ML  injection Inject 35 Units into the skin 2 (two) times daily.     Yes Historical Provider, MD  Insulin Pen Needle (NOVOFINE) 32G X 6 MM MISC  by Does not apply route as directed.     Yes Historical Provider, MD  Lancets (FREESTYLE) lancets 1 each by Other route. Use as instructed    Yes Historical Provider, MD  lisinopril (PRINIVIL,ZESTRIL) 10 MG tablet Take 1 tablet (10 mg total) by mouth daily. 03/14/11 03/13/12 Yes Midge Minium, MD  oxyCODONE-acetaminophen (PERCOCET) 10-325 MG per tablet Take 1 tablet by mouth every 6 (six) hours as needed for pain. 03/25/11 03/24/12  Thomas A. Cornett, MD  traMADol (ULTRAM) 50 MG tablet Take 1 tablet (50 mg total) by mouth every 6 (six) hours as needed for pain. 03/14/11 03/13/12  Midge Minium, MD    Allergies:   Allergies  Allergen Reactions  . Morphine     hives    Social History:  reports that she has never smoked. She has never used smokeless tobacco. She reports that she does not drink alcohol or use illicit drugs.  Family History: Family History  Problem Relation Age of Onset  . Hypertension Mother     grandfather  . Diabetes Mother     grandmother  . Stroke      grandparent  . Lung cancer Maternal Grandfather     Physical Exam: Blood pressure 165/84, pulse 77, temperature 97.9 F (36.6 C), temperature source Axillary, resp. rate 16, height 5\' 5"  (1.651 m), weight 82 kg (180 lb 12.4 oz), last menstrual period 08/17/2011, SpO2 100.00%. General: Alert, awake, oriented x3, in no acute distress. HEENT: anicteric sclera, pink conjunctiva, pupils equal and reactive to light and accomodation Neck: supple, no masses or lymphadenopathy, no goiter, no bruits  Heart: Regular rate and rhythm, without murmurs, rubs or gallops. Lungs: Clear to auscultation bilaterally, no wheezing, rales or rhonchi. Abdomen: Soft, nontender, nondistended, positive bowel sounds, no masses. Extremities: No clubbing, cyanosis or edema with positive pedal pulses, some tenderness on the left shoulder, range of movement not affected. Neuro: Grossly intact, no focal neurological deficits, strength 5/5  upper and lower extremities bilaterally Psych: alert and oriented x 3, normal mood and affect Skin: no rashes or lesions, warm and dry   LABS on Admission:  Basic Metabolic Panel:  Lab [id number] 2025  NA 135  K 3.5  CL 98  CO2 28  GLUCOSE 130*  BUN 8  CREATININE 0.60  CALCIUM 9.3  MG --  PHOS --   CBC:  Lab 08/26/11 2025  WBC 6.3  NEUTROABS 4.1  HGB 9.9*  HCT 30.3*  MCV 79.9  PLT 386   Cardiac Enzymes:  Lab 08/26/11 2025  CKTOTAL 133  CKMB 3.9  CKMBINDEX --  TROPONINI <0.30     Radiological Exams on Admission: Dg Chest 2 View  08/26/2011  *RADIOLOGY REPORT*  Clinical Data: Left-sided chest pain.  Shortness of breath. Diabetes and hypertension.  CHEST - 2 VIEW  Comparison: 04/10/2010  Findings: Heart size is normal.  Both lungs are clear.  No evidence of pleural effusion.  No mass or lymphadenopathy identified.  IMPRESSION: No active cardiopulmonary disease.  Original Report Authenticated By: Marlaine Hind, M.D.    Assessment/Plan Present on Admission:   .Chest pain, atypical: Currently resolved, atypical in character, specially with her bending down in and choking in her throat makes me think patient may have GERD/esophagitis. First set of cardiac enzymes negative, EKG without any acute ST-T  wave changes - Admit to observation on telemetry, serial cardiac enzymes, HbA1c, 2-D echo for further workup (given dyspnea and palpitations), although if she continues to have complaints of palpitations she may require Holter monitor. - Continue aspirin, lisinopril and HCTZ. Currently heart rate well controlled, hence did not start on any beta blocker   .Palpitations:  - Obtain TSH, monitor on telemetry. Since patient had 3 episodes today, might be worthwhile to have Holter monitor.  - Patient is also under considerable stress, works as 7 days in a week, may be also contributing factor towards her symptoms   .DIABETES MELLITUS, TYPE II: - Obtain HbA1c, continue Levemir and  added sliding scale insulin, carb modified diet   .DEPRESSIVE DISORDER: Patient is not on any antidepressants   .HTN (hypertension) - Continue lisinopril, HCTZ  DVT prophylaxis: SCDs  CODE STATUS: Full code  Further plan will depend as patient's clinical course evolves and further radiologic and laboratory data become available.   @Time  Spent on Admission: 1 hour  Hommer Cunliffe M.D. [facility] 08/27/2011, 2:35 AM

## 2011-08-27 NOTE — Discharge Summary (Signed)
Shannon Caldwell MRN: [id number] DOB/AGE: 1971/03/11 41 y.o.  Admit date: 08/26/2011 Discharge date: 08/27/2011  Primary Care Physician:  Annye Asa, MD, MD   Discharge Diagnoses:   Patient Active Problem List  Diagnoses  . DIABETES MELLITUS, TYPE II  . DEPRESSIVE DISORDER  . PANCREATIC DISORDER  . ASPLENIA  . ACQUIRED PARTIAL ABSENCE OF PANCREAS  . SINUSITIS - ACUTE-NOS  . CELLULITIS/ABSCESS NOS  . OM (otitis media)  . Menorrhagia  . Vaginal yeast infection  . Dizziness  . Rash  . HTN (hypertension)  . Hidradenitis  . Sinusitis  . Chest pain, atypical  . Palpitations    DISCHARGE MEDICATION: Medication List  As of 08/27/2011  1:57 PM   TAKE these medications         aspirin 81 MG EC tablet   Take 1 tablet (81 mg total) by mouth daily.      ferrous sulfate 325 (65 FE) MG tablet   Take 1 tablet (325 mg total) by mouth daily with breakfast.      freestyle lancets   1 each by Other route. Use as instructed      FREESTYLE TEST STRIPS test strip   Generic drug: glucose blood   1 each by Other route. Use as instructed      hydrochlorothiazide 25 MG tablet   Commonly known as: HYDRODIURIL   Take 25 mg by mouth daily.      insulin aspart 100 UNIT/ML injection   Commonly known as: novoLOG   Sliding scale,with meals.      LEVEMIR FLEXPEN 100 UNIT/ML injection   Generic drug: insulin detemir   Inject 35 Units into the skin 2 (two) times daily.      lisinopril 10 MG tablet   Commonly known as: PRINIVIL,ZESTRIL   Take 1 tablet (10 mg total) by mouth daily.      NOVOFINE 32G X 6 MM Misc   Generic drug: Insulin Pen Needle   by Does not apply route as directed.      oxyCODONE-acetaminophen 10-325 MG per tablet   Commonly known as: PERCOCET   Take 1 tablet by mouth every 6 (six) hours as needed for pain.      traMADol 50 MG tablet   Commonly known as: ULTRAM   Take 1 tablet (50 mg total) by mouth every 6 (six) hours as needed for pain.               Consults:     SIGNIFICANT DIAGNOSTIC STUDIES:  Dg Chest 2 View  08/26/2011  *RADIOLOGY REPORT*  Clinical Data: Left-sided chest pain.  Shortness of breath. Diabetes and hypertension.  CHEST - 2 VIEW  Comparison: 04/10/2010  Findings: Heart size is normal.  Both lungs are clear.  No evidence of pleural effusion.  No mass or lymphadenopathy identified.  IMPRESSION: No active cardiopulmonary disease.  Original Report Authenticated By: Marlaine Hind, M.D.       No results found for this or any previous visit (from the past 240 hour(s)).  BRIEF ADMITTING H & P: Patient is a 41 year old female with history of diabetes on insulin, GERD, hypertension, hyperlipidemia initially presented to [facility] ED with atypical chest pain and palpitations. Patient was transferred to [facility] for further workup. History was obtained from the patient who stated that she woke up this morning, around 9:30 AM she bent over to pick up something and felt her heart racing. She felt some tightness in her chest, some choking in  her throat but the symptoms resolved as soon as she stood up after a few minutes. Patient then went to work and around 12:30 she had another similar episode, again on bending down. Patient also had pain to her left shoulder and felt that may be from moving boxes at work. Patient had another symptom around 5:30 PM which concerned her and she presented to the ED for further workup. Patient had no radiation of the atypical chest tightness, patient however has some tenderness on her left shoulder which she attributes to possibly 'pulled a muscle' while moving the boxes. At the time of my encounter patient had no chest pain or palpitations. Patient denies any diaphoresis, nausea or vomiting, numbness or tingling    Hospital Course:  Present on Admission:  .Chest pain, atypical: Patient states that the chest pain was minimal compared to the palpitations. She states that she had only  minimal chest pain once the palpitations occur. She's had no further chest pains in the hospital her enzymes were cycled and were negative. She will followup with cardiologist and outpatient echocardiogram if necessary.  .Palpitations: The patient complains of palpitation when bending over. It is quite possible the patient may be experiencing some AVNRT. A TSH was checked and found to be within normal limits. However I was encouraged as the patient states that while in the emergency room she had an episode of palpitations while on the monitor which did not show any arrhythmias. She did certainly have a cardiac arrhythmia that did not pick up on the monitor status of the knee joint monitor. In light of this I do think the patient warrants having an event monitor and an echocardiogram to evaluate her left ventricular function. I have spoken with cardiology and they will call to arrange the outpatient appointment.   Marland KitchenDIABETES MELLITUS, TYPE II: 11 A1c of 10.5 reflects poor control long-term basis. I will defer to primary care physician to further titrate medications.   .DEPRESSIVE DISORDER: Patient has history of depressive disorder however had a normal affect during this hospitalization  .HTN (hypertension): Blood pressures are relatively well controlled during this hospitalization still not at goal for patient with diabetes.  . Leukocytosis: The patient had a mild leukocytosis on the second day of her admission. There is no focus for infection chest x-ray and urinalysis were both negative. I suspect this is secondary to stress. I will not see this solitary finding and hospital workup. I will defer to primary care physician for further evaluation of this.  Patient's condition stable at the time of discharge.  Disposition and Follow-up:  Patient's father primary care physician in 3-5 days. The cardiology office will call the patient to schedule outpatient cardiology visits for further evaluation of  palpitation  Discharge Orders    Future Orders Please Complete By Expires   Diet Carb Modified      Activity as tolerated - No restrictions         DISCHARGE EXAM:  General: Alert, awake, oriented x3, in no acute distress.  Vital Signs: Blood pressure 133/75, pulse 71, temperature 97.6 F (36.4 C), temperature source Oral, resp. rate 18, height 5\' 5"  (1.651 m), weight 82 kg (180 lb 12.4 oz), last menstrual period 08/17/2011, SpO2 100.00%. HEENT: [place]/AT PEERL, EOMI Neck: Trachea midline,  no masses, no thyromegal,y no JVD, no carotid bruit OROPHARYNX:  Moist, No exudate/ erythema/lesions.  Heart: Regular rate and rhythm, without murmurs, rubs, gallops, PMI non-displaced, no heaves or thrills on palpation.  Lungs: Clear to  auscultation, no wheezing or rhonchi noted. No increased vocal fremitus resonant to percussion  Abdomen: Soft, nontender, nondistended, positive bowel sounds, no masses no hepatosplenomegaly noted..  Neuro: No focal neurological deficits noted cranial nerves II through XII grossly intact. DTRs 2+ bilaterally upper and lower extremities. Strength 5/5 in bilateral upper and lower extremities. Musculoskeletal: No warm swelling or erythema around joints, no spinal tenderness noted. Psychiatric: Patient alert and oriented x3, good insight and cognition, good recent to remote recall. Lymph node survey: No cervical axillary or inguinal lymphadenopathy noted.   Basename 08/27/11 0930 08/27/11 0301 08/26/11 2025  NA -- 132* 135  K -- 3.5 3.5  CL -- 96 98  CO2 -- 25 28  GLUCOSE -- 242* 130*  BUN -- 7 8  CREATININE -- 0.51 0.60  CALCIUM -- 8.9 9.3  MG 1.9 -- --  PHOS 3.3 -- --   No results found for this basename: AST:2,ALT:2,ALKPHOS:2,BILITOT:2,PROT:2,ALBUMIN:2 in the last 72 hours No results found for this basename: LIPASE:2,AMYLASE:2 in the last 72 hours  Basename 08/27/11 0301 08/26/11 2025  WBC 13.6* 6.3  NEUTROABS -- 4.1  HGB 10.0* 9.9*  HCT 31.0* 30.3*  MCV  80.7 79.9  PLT 351 386   Total time for discharge process including face-to-face time approximately 35 minutes Signed: Symantha Steeber A. 08/27/2011, 1:57 PM

## 2011-08-27 NOTE — ED Notes (Signed)
Pt reports that after giving the Nitro, she feels like her heart is beating fast and she feels nervous. Pt being monitored and RN at bedside. No change in heart rate/rhythm noted. Maintained NSR at 85 without ecotopy.

## 2011-08-27 NOTE — ED Notes (Signed)
Pt reports feeling very anxious at this time. Reassured her. [facility] enroute for transfer. Report given to receiving RN

## 2011-08-27 NOTE — Progress Notes (Signed)
Discharge instructions given to pt. Pt verbalized understanding. Removed IV pt tolerated well. Pt ready for discharge when ride arrives.

## 2011-09-01 ENCOUNTER — Encounter: Payer: Self-pay | Admitting: Family Medicine

## 2011-09-01 ENCOUNTER — Ambulatory Visit (INDEPENDENT_AMBULATORY_CARE_PROVIDER_SITE_OTHER): Payer: BC Managed Care – PPO | Admitting: Family Medicine

## 2011-09-01 DIAGNOSIS — R079 Chest pain, unspecified: Secondary | ICD-10-CM

## 2011-09-01 DIAGNOSIS — R0789 Other chest pain: Secondary | ICD-10-CM

## 2011-09-01 DIAGNOSIS — R002 Palpitations: Secondary | ICD-10-CM

## 2011-09-01 MED ORDER — OMEPRAZOLE 40 MG PO CPDR: 40.0000 mg | DELAYED_RELEASE_CAPSULE | Freq: Every day | ORAL | Status: DC

## 2011-09-01 NOTE — Progress Notes (Signed)
  Subjective:    Patient ID: Shannon Caldwell, female    DOB: Feb 08, 1971, 41 y.o.   MRN: [id number]  HPI CP- was admitted to [facility] on 3/22 w/ atypical CP after she had 3 episodes.  Occurred w/ leaning forward- had palpitations and then on standing felt like she was choking.  During the 3rd episode had palpitations, chest pain, associated SOB.  Went to ER.  Admitted.  Ruled out for MI.  No obvious arrhythmia seen.  Continuing to feel fatigued.  Taking iron pills but continues to have very heavy periods.   Review of Systems For ROS see HPI     Objective:   Physical Exam  Vitals reviewed. Constitutional: She is oriented to person, place, and time. She appears well-developed and well-nourished. No distress.  HENT:  Head: Normocephalic and atraumatic.  Eyes: Conjunctivae and EOM are normal. Pupils are equal, round, and reactive to light.  Neck: Normal range of motion. Neck supple. No thyromegaly present.  Cardiovascular: Normal rate, regular rhythm, normal heart sounds and intact distal pulses.   No murmur heard. Pulmonary/Chest: Effort normal and breath sounds normal. No respiratory distress.  Abdominal: Soft. She exhibits no distension. There is no tenderness.  Musculoskeletal: She exhibits no edema.  Lymphadenopathy:    She has no cervical adenopathy.  Neurological: She is alert and oriented to person, place, and time.  Skin: Skin is warm and dry.  Psychiatric: She has a normal mood and affect. Her behavior is normal.          Assessment & Plan:

## 2011-09-01 NOTE — Patient Instructions (Signed)
We will call you with your cardiology appt Start the Omeprazole daily for likely esophageal spasm Call your GYN to discuss your anemia and possible options Call with any questions or concerns Hang in there!

## 2011-09-04 NOTE — Assessment & Plan Note (Signed)
New to provider.  Pt's sxs seem more consistent w/ esophageal spasm and GERD rather than cardiac but given multiple medical issues will refer to cards for complete w/u.  Start PPI.  Reviewed dx, tx, possible red flags.  Pt expressed understanding and is in agreement w/ plan.

## 2011-09-04 NOTE — Assessment & Plan Note (Signed)
New.  Likely anxiety over new onset CP.  Will refer to cards for complete evaluation.  Reviewed supportive care and red flags that should prompt return.  Pt expressed understanding and is in agreement w/ plan.

## 2011-09-07 ENCOUNTER — Encounter: Payer: Self-pay | Admitting: *Deleted

## 2011-09-07 ENCOUNTER — Encounter: Payer: Self-pay | Admitting: Cardiology

## 2011-09-07 ENCOUNTER — Ambulatory Visit (INDEPENDENT_AMBULATORY_CARE_PROVIDER_SITE_OTHER): Payer: BC Managed Care – PPO | Admitting: Cardiology

## 2011-09-07 VITALS — BP 152/100 | HR 84 | Ht 66.0 in | Wt 184.0 lb

## 2011-09-07 DIAGNOSIS — R0789 Other chest pain: Secondary | ICD-10-CM

## 2011-09-07 DIAGNOSIS — R0600 Dyspnea, unspecified: Secondary | ICD-10-CM

## 2011-09-07 DIAGNOSIS — R0609 Other forms of dyspnea: Secondary | ICD-10-CM

## 2011-09-07 DIAGNOSIS — R079 Chest pain, unspecified: Secondary | ICD-10-CM

## 2011-09-07 NOTE — Progress Notes (Signed)
HPI Shannon Caldwell is a 41 year old black female is referred today by Dr. Birdie Riddle for the evaluation of chest pain. She was recently evaluated and the emergency room and admitted for chest discomfort radiating to the left shoulder. EKG was normal and cardiac markers were negative. She also has some palpitations during the hospitalization but there were no arrhythmias on the telemetry.  Since discharge he continues to have atypical chest pain. Describes chest heaviness that can occur at rest more so than with activity. There no other associated symptoms.  Her biggest complaint today is that of dyspnea on exertion over the last month.  She has had diabetes for 10 years. He also has a history of hypertension. She does not smoke.   Past Medical History  Diagnosis Date  . Diabetes mellitus, type 2   . Blood transfusion   . GERD (gastroesophageal reflux disease)   . Hyperlipidemia   . Hypertension   . Generalized headaches   . Cervical pain (neck)     due to abscess     Current Outpatient Prescriptions  Medication Sig Dispense Refill  . aspirin EC 81 MG EC tablet Take 1 tablet (81 mg total) by mouth daily.      . cefUROXime (CEFTIN) 500 MG tablet       . ferrous sulfate 325 (65 FE) MG tablet Take 1 tablet (325 mg total) by mouth daily with breakfast.  30 tablet  2  . glucose blood (FREESTYLE TEST STRIPS) test strip 1 each by Other route. Use as instructed       . hydrochlorothiazide 25 MG tablet Take 25 mg by mouth daily.        . insulin aspart (NOVOLOG FLEXPEN) 100 UNIT/ML injection Sliding scale,with meals.  9 mL  3  . insulin detemir (LEVEMIR FLEXPEN) 100 UNIT/ML injection Inject 35 Units into the skin 2 (two) times daily.        . Insulin Pen Needle (NOVOFINE) 32G X 6 MM MISC by Does not apply route as directed.        . Lancets (FREESTYLE) lancets 1 each by Other route. Use as instructed       . lisinopril (PRINIVIL,ZESTRIL) 10 MG tablet Take 1 tablet (10 mg total) by mouth daily.  30 tablet   6  . omeprazole (PRILOSEC) 40 MG capsule Take 1 capsule (40 mg total) by mouth daily.  30 capsule  3  . oxyCODONE-acetaminophen (PERCOCET) 10-325 MG per tablet Take 1 tablet by mouth every 6 (six) hours as needed for pain.  20 tablet  0  . tinidazole (TINDAMAX) 500 MG tablet       . traMADol (ULTRAM) 50 MG tablet Take 1 tablet (50 mg total) by mouth every 6 (six) hours as needed for pain.  45 tablet  0  . DISCONTD: mometasone (NASONEX) 50 MCG/ACT nasal spray Place 2 sprays into the nose daily.  17 g  2    Allergies  Allergen Reactions  . Morphine     hives    Family History  Problem Relation Age of Onset  . Hypertension Mother     grandfather  . Diabetes Mother     grandmother  . Stroke      grandparent  . Lung cancer Maternal Grandfather     History   Social History  . Marital Status: Divorced    Spouse Name: N/A    Number of Children: 3  . Years of Education: N/A   Occupational History  . nursing student   .  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 [employer]     during week   Social History Main Topics  . Smoking status: Never Smoker   . Smokeless tobacco: Never Used  . Alcohol Use: No  . Drug Use: No  . Sexually Active: Not on file   Other Topics Concern  . Not on file   Social History Narrative   Daughter 75, son 26, son 92Grandson lives with Patient    ROS ALL NEGATIVE EXCEPT THOSE NOTED IN HPI  PE  General Appearance: well developed, well nourished in no acute distress HEENT: symmetrical face, PERRLA, good dentition  Neck: no JVD, thyromegaly, or adenopathy, trachea midline Chest: symmetric without deformity Cardiac: PMI non-displaced, RRR, normal S1, S2, no gallop or murmur Lung: clear to ausculation and percussion Vascular: all pulses full without bruits  Abdominal: nondistended, nontender, good bowel sounds, no HSM, no bruits Extremities: no cyanosis, clubbing or edema, no sign of DVT, no varicosities  Skin: normal color, no  rashes Neuro: alert and oriented x 3, non-focal Pysch: normal affect  EKG Normal sinus rhythm, normal EKG BMET    Component Value Date/Time   NA 132* 08/27/2011 0301   K 3.5 08/27/2011 0301   CL 96 08/27/2011 0301   CO2 25 08/27/2011 0301   GLUCOSE 242* 08/27/2011 0301   BUN 7 08/27/2011 0301   CREATININE 0.51 08/27/2011 0301   CALCIUM 8.9 08/27/2011 0301   GFRNONAA >90 08/27/2011 0301   GFRAA >90 08/27/2011 0301    Lipid Panel  No results found for this basename: chol, trig, hdl, cholhdl, vldl, ldlcalc    CBC    Component Value Date/Time   WBC 13.6* 08/27/2011 0301   RBC 3.84* 08/27/2011 0301   HGB 10.0* 08/27/2011 0301   HCT 31.0* 08/27/2011 0301   PLT 351 08/27/2011 0301   MCV 80.7 08/27/2011 0301   MCH 26.0 08/27/2011 0301   MCHC 32.3 08/27/2011 0301   RDW 14.8 08/27/2011 0301   LYMPHSABS 1.8 08/26/2011 2025   MONOABS 0.3 08/26/2011 2025   EOSABS 0.0 08/26/2011 2025   BASOSABS 0.0 08/26/2011 2025

## 2011-09-07 NOTE — Assessment & Plan Note (Signed)
This could be an anginal or ischemia equivalent with her diabetes. She also has hypertension. I do not think that her chest pain is angina.  Arrange for an exercise stress Myoview to reproduce her symptoms of dyspnea to rule out obstructive coronary artery disease.

## 2011-09-07 NOTE — Patient Instructions (Signed)
Your physician recommends that you continue on your current medications as directed. Please refer to the Current Medication list given to you today.  Your physician has requested that you have en exercise stress myoview. For further information please visit [email or web address]. Please follow instruction sheet, as given.

## 2011-09-07 NOTE — Assessment & Plan Note (Signed)
This has improved with the PPI. I do not think his coronary. Reassurance given.

## 2011-09-14 ENCOUNTER — Ambulatory Visit (HOSPITAL_COMMUNITY): Payer: BC Managed Care – PPO | Attending: Cardiology | Admitting: Radiology

## 2011-09-14 VITALS — BP 126/76 | Ht 65.0 in | Wt 178.0 lb

## 2011-09-14 DIAGNOSIS — R002 Palpitations: Secondary | ICD-10-CM

## 2011-09-14 DIAGNOSIS — I10 Essential (primary) hypertension: Secondary | ICD-10-CM | POA: Insufficient documentation

## 2011-09-14 DIAGNOSIS — R079 Chest pain, unspecified: Secondary | ICD-10-CM

## 2011-09-14 DIAGNOSIS — E119 Type 2 diabetes mellitus without complications: Secondary | ICD-10-CM

## 2011-09-14 DIAGNOSIS — R0602 Shortness of breath: Secondary | ICD-10-CM

## 2011-09-14 DIAGNOSIS — R0600 Dyspnea, unspecified: Secondary | ICD-10-CM

## 2011-09-14 DIAGNOSIS — Z794 Long term (current) use of insulin: Secondary | ICD-10-CM | POA: Insufficient documentation

## 2011-09-14 DIAGNOSIS — R000 Tachycardia, unspecified: Secondary | ICD-10-CM | POA: Insufficient documentation

## 2011-09-14 DIAGNOSIS — R0609 Other forms of dyspnea: Secondary | ICD-10-CM | POA: Insufficient documentation

## 2011-09-14 DIAGNOSIS — R0989 Other specified symptoms and signs involving the circulatory and respiratory systems: Secondary | ICD-10-CM | POA: Insufficient documentation

## 2011-09-14 DIAGNOSIS — E785 Hyperlipidemia, unspecified: Secondary | ICD-10-CM | POA: Insufficient documentation

## 2011-09-14 MED ORDER — TECHNETIUM TC 99M TETROFOSMIN IV KIT
10.0000 | PACK | Freq: Once | INTRAVENOUS | Status: AC | PRN
  Administered 2011-09-14: 10 via INTRAVENOUS

## 2011-09-14 MED ORDER — TECHNETIUM TC 99M TETROFOSMIN IV KIT
30.0000 | PACK | Freq: Once | INTRAVENOUS | Status: AC | PRN
  Administered 2011-09-14: 30 via INTRAVENOUS

## 2011-09-14 NOTE — Progress Notes (Signed)
[facility] [place] [id number]  Cardiology Nuclear Med Study  Shannon Caldwell is a 41 y.o. female     MRN : [id number]     DOB: 1970/10/02  Procedure Date: 09/14/2011  Nuclear Med Background Indication for Stress Test:  Evaluation for Ischemia, 08/26/11 CP-ED, (-) cardiac markers History:  No Previous Cardiac History Cardiac Risk Factors: Hypertension, IDDM Type 2 and Lipids  Symptoms:  Chest Pain, DOE and Rapid HR   Nuclear Pre-Procedure Caffeine/Decaff Intake:  None NPO After: 9:30pm   Lungs:  clear O2 Sat: 99% on room air. IV 0.9% NS with Angio Cath:  22g  IV Site: R Antecubital  IV Started by:  Eliezer Lofts, EMT-P  Chest Size (in):  38 Cup Size: DD  Height: 5\' 5"  (1.651 m)  Weight:  178 lb (80.74 kg)  BMI:  Body mass index is 29.62 kg/(m^2). Tech Comments:  CBG= 159 @ 8:00am, per patient.    Nuclear Med Study 1 or 2 day study: 1 day  Stress Test Type:  Stress  Reading MD: Kirk Ruths, MD  Order Authorizing Provider:  Jenell Milliner, MD  Resting Radionuclide: Technetium 11m Tetrofosmin  Resting Radionuclide Dose: 10.9 mCi   Stress Radionuclide:  Technetium 25m Tetrofosmin  Stress Radionuclide Dose: 32.0 mCi           Stress Protocol Rest HR: 82 Stress HR: 176  Rest BP: 126/76 Stress BP: 188/78  Exercise Time (min): 10:30 METS: 12.5   Predicted Max HR: 182 bpm % Max HR: 96.7 bpm Rate Pressure Product: 33088   Dose of Adenosine (mg):  n/a Dose of Lexiscan: n/a mg  Dose of Atropine (mg): n/a Dose of Dobutamine: n/a mcg/kg/min (at max HR)  Stress Test Technologist: Crissie Figures, RN  Nuclear Technologist:  Charlton Amor, CNMT     Rest Procedure:  Myocardial perfusion imaging was performed at rest 45 minutes following the intravenous administration of Technetium 50m Tetrofosmin. Rest ECG: NSR - Normal EKG  Stress Procedure:  The patient performed treadmill exercise using a Bruce  Protocol for 10:30  minutes. The patient stopped due to leg fatigue and denied any chest pain.  There were no acute EKG changes, rare PVC noted.  Technetium 57m Tetrofosmin was injected at peak exercise and myocardial perfusion imaging was performed after a brief delay. Stress ECG: Insignificant upsloping ST segment depression.  QPS Raw Data Images:  Acquisition technically good; normal left ventricular size. Stress Images:  Normal homogeneous uptake in all areas of the myocardium. Rest Images:  Normal homogeneous uptake in all areas of the myocardium. Subtraction (SDS):  No evidence of ischemia. Transient Ischemic Dilatation (Normal <1.22):  0.88 Lung/Heart Ratio (Normal <0.45):  0.34  Quantitative Gated Spect Images QGS EDV:  75 ml QGS ESV:  26 ml  Impression Exercise Capacity:  Good exercise capacity. BP Response:  Normal blood pressure response. Clinical Symptoms:  No chest pain. ECG Impression:  Insignificant upsloping ST segment depression. Comparison with Prior Nuclear Study: No previous nuclear study performed  Overall Impression:  Normal stress nuclear study.  LV Ejection Fraction: 65%.  LV Wall Motion:  NL LV Function; NL Wall Motion   Kirk Ruths

## 2011-09-16 NOTE — Progress Notes (Signed)
EKG orders auto-generated in error.   W.Roldan Laforest,RT-N

## 2011-09-21 ENCOUNTER — Encounter: Payer: Self-pay | Admitting: *Deleted

## 2011-09-21 ENCOUNTER — Telehealth: Payer: Self-pay | Admitting: Cardiology

## 2011-09-21 NOTE — Telephone Encounter (Signed)
New msg Pt was calling about test results Please call

## 2011-09-21 NOTE — Telephone Encounter (Signed)
Pt given results of stress test and return to work letter Horton Chin RN

## 2011-10-17 ENCOUNTER — Other Ambulatory Visit: Payer: Self-pay | Admitting: Family Medicine

## 2011-10-17 NOTE — Telephone Encounter (Signed)
Refill Omeprazole DR 40MG  Capsule Requesting 90-day supply Last written 3.28.13 for 30, take one by mouth daily Last ov 3.28.13

## 2011-10-18 MED ORDER — OMEPRAZOLE 40 MG PO CPDR: 40.0000 mg | DELAYED_RELEASE_CAPSULE | Freq: Every day | ORAL | Status: DC

## 2011-10-18 NOTE — Telephone Encounter (Signed)
rx sent to pharmacy by e-script  

## 2011-11-18 ENCOUNTER — Ambulatory Visit (INDEPENDENT_AMBULATORY_CARE_PROVIDER_SITE_OTHER): Payer: BC Managed Care – PPO | Admitting: Family Medicine

## 2011-11-18 ENCOUNTER — Encounter: Payer: Self-pay | Admitting: Family Medicine

## 2011-11-18 VITALS — BP 130/84 | HR 90 | Temp 98.2°F | Resp 16 | Ht 66.0 in | Wt 180.6 lb

## 2011-11-18 DIAGNOSIS — R81 Glycosuria: Secondary | ICD-10-CM

## 2011-11-18 DIAGNOSIS — R300 Dysuria: Secondary | ICD-10-CM

## 2011-11-18 DIAGNOSIS — E119 Type 2 diabetes mellitus without complications: Secondary | ICD-10-CM

## 2011-11-18 LAB — POCT URINALYSIS DIPSTICK
Bilirubin, UA: NEGATIVE
Glucose, UA: 2000
Leukocytes, UA: NEGATIVE
Nitrite, UA: NEGATIVE
Urobilinogen, UA: 0.2

## 2011-11-18 LAB — GLUCOSE, POCT (MANUAL RESULT ENTRY): POC Glucose: 485 mg/dl — AB (ref 70–99)

## 2011-11-18 MED ORDER — CIPROFLOXACIN HCL 500 MG PO TABS: 500.0000 mg | ORAL_TABLET | Freq: Two times a day (BID) | ORAL | Status: DC

## 2011-11-18 NOTE — Progress Notes (Signed)
  Subjective:    Patient ID: Shannon Caldwell, female    DOB: [birth year], 41 y.o.   MRN: [id number]  HPI Dysuria- sxs started a couple of week ago.  Increased frequency.  No blood.  + odor to urine.  R sided low back pain.  No fevers.  + nausea.  No vomiting.  + hesitancy.  + urgency.   Review of Systems For ROS see HPI     Objective:   Physical Exam  Vitals reviewed. Constitutional: She appears well-developed and well-nourished. No distress.  Abdominal: Soft. She exhibits no distension. There is tenderness (+ suprapubic and R CVA tenderness).          Assessment & Plan:

## 2011-11-18 NOTE — Patient Instructions (Addendum)
Start the Cipro twice daily x7 days Drink plenty of fluids Check your sugar regularly- make sure it's not too high Call with any questions or concerns HAPPY BIRTHDAY!!!!

## 2011-11-20 LAB — URINE CULTURE: Colony Count: 25000

## 2011-11-21 ENCOUNTER — Telehealth: Payer: Self-pay | Admitting: *Deleted

## 2011-11-21 NOTE — Telephone Encounter (Signed)
Likely a skin contaminant and not an actual urine bacteria

## 2011-11-21 NOTE — Telephone Encounter (Signed)
Pt left msg on triage vmail stating she had questions about her UTI.

## 2011-11-21 NOTE — Telephone Encounter (Signed)
Called pt to clarify that still experiancing burning while urinating, notes her CBG is noted as very high, this am before breakfest it was 303, called endo and they can not see her till Friday, pt notes she is seeing spots, and fatigue also noted, asked if any adverse reactions noted, pt noted hives and itching, took benedryl sxs subsided but still notes as puffy this am, pt stated she is still taking the cipro, wants to ask if MD can take pt out of work for a day or so to allow her to recoperate before returning to work, please advise

## 2011-11-21 NOTE — Telephone Encounter (Signed)
Called pt to give verbal orders per MD Tabori to STOP the CIPRO, and per current sxs pt needs to go to the [facility], advised that we can write her a note to be out of work for 1-2 days however she may receive a note from the ER/UC once she is seen, pt wanted to know if MD Birdie Riddle wants to start her on another ABT, advised the culture noted small concern, thus MD Birdie Riddle may not prescribe as well as ER/UC may start her on ABT depending on findings however I will speak to MD Tabori about concern for a different ABT, added cipro to allergy list, please advise

## 2011-11-21 NOTE — Telephone Encounter (Signed)
No need for abx as as she does not have UTI- very common for women to have group B strep in urine and only problematic if pregnant.  So STOP the cipro.  No need for new abx.  Concerning feature is the 'seeing spots'.  Based on this and recently very high sugars, needs UC eval

## 2011-11-21 NOTE — Telephone Encounter (Signed)
Spoke to pt to advise results/instructions. Pt understood. Pt advised not to drive to UC/ER in earlier phone conversation, pt advised she is not driving per waiting on her mother to take her, will call office if any further needs, wants to ask if the Group B strep in her urine will go away on its own?

## 2011-11-22 ENCOUNTER — Ambulatory Visit (INDEPENDENT_AMBULATORY_CARE_PROVIDER_SITE_OTHER): Payer: BC Managed Care – PPO | Admitting: Physician Assistant

## 2011-11-22 VITALS — BP 152/92 | HR 70 | Temp 97.9°F | Resp 16 | Ht 66.5 in | Wt 177.0 lb

## 2011-11-22 DIAGNOSIS — N898 Other specified noninflammatory disorders of vagina: Secondary | ICD-10-CM

## 2011-11-22 DIAGNOSIS — M549 Dorsalgia, unspecified: Secondary | ICD-10-CM

## 2011-11-22 DIAGNOSIS — E119 Type 2 diabetes mellitus without complications: Secondary | ICD-10-CM

## 2011-11-22 DIAGNOSIS — H539 Unspecified visual disturbance: Secondary | ICD-10-CM

## 2011-11-22 DIAGNOSIS — R300 Dysuria: Secondary | ICD-10-CM

## 2011-11-22 DIAGNOSIS — R519 Headache, unspecified: Secondary | ICD-10-CM

## 2011-11-22 LAB — POCT WET PREP WITH KOH: Yeast Wet Prep HPF POC: NEGATIVE

## 2011-11-22 LAB — POCT CBC
HCT, POC: 38.7 % (ref 37.7–47.9)
Hemoglobin: 12 g/dL — AB (ref 12.2–16.2)
MPV: 8.5 fL (ref 0–99.8)
POC Granulocyte: 5.7 (ref 2–6.9)
POC MID %: 5.3 %M (ref 0–12)
RBC: 4.65 M/uL (ref 4.04–5.48)
WBC: 8.3 10*3/uL (ref 4.6–10.2)

## 2011-11-22 LAB — POCT URINALYSIS DIPSTICK
Blood, UA: NEGATIVE
Glucose, UA: 500
Nitrite, UA: NEGATIVE
Protein, UA: NEGATIVE
Spec Grav, UA: 1.015
Urobilinogen, UA: 0.2

## 2011-11-22 LAB — POCT UA - MICROSCOPIC ONLY
Casts, Ur, LPF, POC: NEGATIVE
Crystals, Ur, HPF, POC: NEGATIVE
Yeast, UA: NEGATIVE

## 2011-11-22 LAB — POCT URINE PREGNANCY: Preg Test, Ur: NEGATIVE

## 2011-11-22 LAB — GLUCOSE, POCT (MANUAL RESULT ENTRY): POC Glucose: 287 mg/dl — AB (ref 70–99)

## 2011-11-22 MED ORDER — METRONIDAZOLE 0.75 % VA GEL: VAGINAL | Status: DC

## 2011-11-22 NOTE — Telephone Encounter (Signed)
Spoke to pt to advise results/instructions. Pt understood pt noted she is actually going to UC today and has an apt.

## 2011-11-22 NOTE — Assessment & Plan Note (Signed)
Pt w/ extensive glucosuria today.  CBG in office is 400+.  Encouraged her to call Endo and adjust SSI for possible infxn.  Pt expressed understanding and is in agreement w/ plan.

## 2011-11-22 NOTE — Progress Notes (Signed)
Subjective:    Patient ID: Shannon Caldwell, female    DOB: 10-23-1970, 41 y.o.   MRN: [id number]  HPI Laiklyn comes in today for several concerns. She has a complicated health history.  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 She has DM II, HTN. She sees Dr. Birdie Riddle regularly and Endocrinology on regular basis.  2 weeks ago she noticed urinary frequency and then a strong ammonia odor to her urine that started 3 days later.  She noted a small amount of discharge but no itching or burning and minimal dysuria with discomfort just at the initiation of voiding.  No terminal dysuria. Initially had right back pain and now is bilateral. Just last Friday (5 days ago) she has the sensation that she would still need to urinate even after emptying her bladder. She was seen by Dr. Birdie Riddle and treated for UTI with Cipro.  Of note, her BS was over 400 in the office but states that her BS was controlled up until that point. She adjusted her insulin and has had lower readings since.  She reports that she developed itching all over and hives after the first dose of Cipro but continued to take it not thinking she was reacting to it.  She saw no improvement of her symptoms and called Dr. Birdie Riddle who told her to d/c Cipro anyway because urine culture was negative except for small amount of Group B strep that grew.  She also noted that she started see small intermittent black spots in her visual field yesterday. She was advised to come here for follow up.  Upon asking she notes that she has seen flashing lights intermittently as well just in her left eye in peripheral field.  She also notes that she has had a dull nagging HA for most of the 2 weeks and does report a history of migraines with aura but it has been rare.    She has had a mild amount of nausea but no vomiting or diarrhea.  She has had no fever or chills.  No abdominal  pain. No myalgias. She was last sexually active 3 months ago.  LMP 11/18/11.  She notes she has a lot of stress and works 2 jobs.  She has been working much more in the last 2 weeks and is also moving.   Review of Systems As noted in HPI    Objective:   Physical Exam  Constitutional: She is oriented to person, place, and time. She appears well-developed and well-nourished. She does not appear ill. No distress.  HENT:  Right Ear: Tympanic membrane normal.  Left Ear: Tympanic membrane normal.  Mouth/Throat: Uvula is midline and oropharynx is clear and moist.  Eyes: No scleral icterus.  Fundoscopic exam:      The right eye shows no hemorrhage and no papilledema.       The left eye shows no hemorrhage and no papilledema.  Cardiovascular: Normal rate and regular rhythm.   Pulmonary/Chest: Effort normal and breath sounds normal.  Abdominal: Soft. Bowel sounds are normal. There is generalized tenderness (Notes she alwaysbhas mild abdominal pain secondary to scarring). There is CVA tenderness. Rebound: bilateral on light palpation/percussion.  Genitourinary: Uterus normal. There is no lesion on the right labia. There is no lesion on the left labia. Cervix exhibits no motion tenderness. Right adnexum displays no tenderness. Left adnexum displays no tenderness. No erythema around  the vagina. Vaginal discharge found.  Lymphadenopathy:    She has no cervical adenopathy.  Neurological: She is alert and oriented to person, place, and time. She has normal strength. She displays a negative Romberg sign.     Results for orders placed in visit on 11/22/11  POCT UA - MICROSCOPIC ONLY      Component Value Range   WBC, Ur, HPF, POC 1-2     RBC, urine, microscopic 0-1     Bacteria, U Microscopic small     Mucus, UA neg     Epithelial cells, urine per micros 3-6     Crystals, Ur, HPF, POC neg     Casts, Ur, LPF, POC neg     Yeast, UA neg    POCT URINALYSIS DIPSTICK      Component Value Range    Color, UA yellow     Clarity, UA hazy     Glucose, UA 500     Bilirubin, UA neg     Ketones, UA trace     Spec Grav, UA 1.015     Blood, UA neg     pH, UA 6.5     Protein, UA neg     Urobilinogen, UA 0.2     Nitrite, UA neg     Leukocytes, UA Negative    POCT CBC      Component Value Range   WBC 8.3  4.6 - 10.2 K/uL   Lymph, poc 2.2  0.6 - 3.4   POC LYMPH PERCENT 26.4  10 - 50 %L   MID (cbc) 0.4  0 - 0.9   POC MID % 5.3  0 - 12 %M   POC Granulocyte 5.7  2 - 6.9   Granulocyte percent 68.3  37 - 80 %G   RBC 4.65  4.04 - 5.48 M/uL   Hemoglobin 12.0 (*) 12.2 - 16.2 g/dL   HCT, POC 38.7  37.7 - 47.9 %   MCV 83.2  80 - 97 fL   MCH, POC 25.8 (*) 27 - 31.2 pg   MCHC 31.0 (*) 31.8 - 35.4 g/dL   RDW, POC 16.7     Platelet Count, POC 487 (*) 142 - 424 K/uL   MPV 8.5  0 - 99.8 fL  GLUCOSE, POCT (MANUAL RESULT ENTRY)      Component Value Range   POC Glucose 287 (*) 70 - 99 mg/dl  POCT WET PREP WITH KOH      Component Value Range   Trichomonas, UA Negative     Clue Cells Wet Prep HPF POC 8-12     Epithelial Wet Prep HPF POC 2-3     Yeast Wet Prep HPF POC neg     Bacteria Wet Prep HPF POC 2+     RBC Wet Prep HPF POC neg     WBC Wet Prep HPF POC 5-8     KOH Prep POC Negative    POCT URINE PREGNANCY      Component Value Range   Preg Test, Ur Negative         Assessment & Plan:  BV HA Visual Changes HTN DM II with recent elevation of glucose unexplained  Check CMET, gen probe Metrogel Vaginal x 5 days Adjust insulin based on SSI protocol. Take Lisinopril 20 tonight if BP remains elevated at home. Pt to schedule appt with Dr. Gershon Crane tomorrow.

## 2011-11-22 NOTE — Assessment & Plan Note (Signed)
New.  Pt's sxs consistent w/ UTI.  Start abx.  Discussed impact of infxn on sugar control.  Pt to call endo.  Reviewed supportive care and red flags that should prompt return.  Pt expressed understanding and is in agreement w/ plan.

## 2011-11-22 NOTE — Patient Instructions (Addendum)
Adjust Blood sugar based on home reading You may take Lisinopril 20 mg if BP is 150/90 or more Call Dr. Gershon Crane tomorrow for appointment tomorrow Use vaginal gel each night for 5 nights

## 2011-11-23 ENCOUNTER — Telehealth: Payer: Self-pay | Admitting: *Deleted

## 2011-11-23 ENCOUNTER — Encounter: Payer: Self-pay | Admitting: Physician Assistant

## 2011-11-23 ENCOUNTER — Telehealth: Payer: Self-pay

## 2011-11-23 NOTE — Telephone Encounter (Signed)
Pt saw the ophthalmologist who states that she has a enlarge nerve which is causing the spots. Pt notes that she was referred to the UC by Dr Birdie Riddle and the UC took her out of work until the June 21. Pt is going to have to get FMLA filled out and would like to know who needs to complete this for her. Pt also states that she was advise that Dr Birdie Riddle would extend her note from Friday to include Monday and Tuesday. .Please advise

## 2011-11-23 NOTE — Telephone Encounter (Signed)
Will include Monday/Tuesday in work coverage but not sure why UC took her out for the whole week.  It will be hard to do FMLA forms w/out knowing the reason behind the excuse

## 2011-11-23 NOTE — Telephone Encounter (Signed)
Shannon Caldwell,  Pt went to optom -occular stress symdone is causing the headaches  Will need flma

## 2011-11-23 NOTE — Telephone Encounter (Signed)
.  left message to have patient return my call.  

## 2011-11-24 LAB — COMPREHENSIVE METABOLIC PANEL
Albumin: 4.3 g/dL (ref 3.5–5.2)
BUN: 10 mg/dL (ref 6–23)
CO2: 28 mEq/L (ref 19–32)
Calcium: 9.6 mg/dL (ref 8.4–10.5)
Chloride: 99 mEq/L (ref 96–112)
Glucose, Bld: 268 mg/dL — ABNORMAL HIGH (ref 70–99)
Potassium: 4.1 mEq/L (ref 3.5–5.3)

## 2011-11-24 LAB — GC/CHLAMYDIA PROBE AMP, GENITAL
Chlamydia, DNA Probe: NEGATIVE
GC Probe Amp, Genital: NEGATIVE

## 2011-11-25 NOTE — Telephone Encounter (Signed)
I noted in labs results that her primary MD would need to fill out FMLA as I have only seen her once.

## 2011-11-25 NOTE — Telephone Encounter (Signed)
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

## 2011-11-26 NOTE — Telephone Encounter (Signed)
LMOM to have primary doctor fill out paperwork

## 2011-12-23 ENCOUNTER — Telehealth: Payer: Self-pay | Admitting: Family Medicine

## 2011-12-23 MED ORDER — TRAMADOL HCL 50 MG PO TABS: 50.0000 mg | ORAL_TABLET | Freq: Four times a day (QID) | ORAL | Status: DC | PRN

## 2011-12-23 MED ORDER — HYDROCHLOROTHIAZIDE 25 MG PO TABS: 25.0000 mg | ORAL_TABLET | Freq: Every day | ORAL | Status: DC

## 2011-12-23 MED ORDER — IBUPROFEN 800 MG PO TABS: 800.0000 mg | ORAL_TABLET | Freq: Three times a day (TID) | ORAL | Status: DC | PRN

## 2011-12-23 NOTE — Telephone Encounter (Signed)
rx sent to pharmacy by e-script per verbal from MD Tabori 

## 2011-12-23 NOTE — Telephone Encounter (Signed)
Refill: Ibuprofen 800mg  tablet. Take 1 tablet every 8 hours as needed for pain. Qty 60. Last fill 09-18-11.l

## 2011-12-23 NOTE — Telephone Encounter (Signed)
Refill: Hydrochlorothiazide 25mg  tab. Take 1 tablet every day. Qty 30. Last fill 04-16-11  Tramadol hcl 50mg  tablet. Take 1 tablet by mouth every 6 hours as needed for pain. Qty 1. Last fill 04-16-11

## 2012-02-28 ENCOUNTER — Encounter (HOSPITAL_COMMUNITY): Payer: Self-pay | Admitting: Pharmacist

## 2012-03-05 ENCOUNTER — Other Ambulatory Visit: Payer: Self-pay | Admitting: Obstetrics and Gynecology

## 2012-03-08 ENCOUNTER — Inpatient Hospital Stay (HOSPITAL_COMMUNITY): Admission: RE | Admit: 2012-03-08 | Payer: BC Managed Care – PPO | Source: Ambulatory Visit

## 2012-03-14 ENCOUNTER — Ambulatory Visit (HOSPITAL_COMMUNITY)
Admission: AD | Admit: 2012-03-14 | Payer: BC Managed Care – PPO | Source: Ambulatory Visit | Admitting: Obstetrics and Gynecology

## 2012-03-14 ENCOUNTER — Encounter (HOSPITAL_COMMUNITY): Admission: AD | Payer: Self-pay | Source: Ambulatory Visit

## 2012-03-14 SURGERY — DILATATION & CURETTAGE/HYSTEROSCOPY WITH THERMACHOICE ABLATION
Anesthesia: Choice

## 2012-03-31 ENCOUNTER — Encounter (HOSPITAL_COMMUNITY): Payer: Self-pay | Admitting: Pharmacist

## 2012-04-05 ENCOUNTER — Inpatient Hospital Stay (HOSPITAL_COMMUNITY): Admission: RE | Admit: 2012-04-05 | Payer: BC Managed Care – PPO | Source: Ambulatory Visit

## 2012-04-13 ENCOUNTER — Ambulatory Visit (HOSPITAL_COMMUNITY)
Admission: RE | Admit: 2012-04-13 | Payer: BC Managed Care – PPO | Source: Ambulatory Visit | Admitting: Obstetrics and Gynecology

## 2012-04-13 ENCOUNTER — Encounter (HOSPITAL_COMMUNITY): Admission: RE | Payer: Self-pay | Source: Ambulatory Visit

## 2012-04-13 SURGERY — DILATATION & CURETTAGE/HYSTEROSCOPY WITH THERMACHOICE ABLATION
Anesthesia: Choice

## 2012-04-29 LAB — HM MAMMOGRAPHY: HM Mammogram: NORMAL

## 2012-04-29 LAB — HM PAP SMEAR: HM PAP: NORMAL

## 2012-05-21 ENCOUNTER — Other Ambulatory Visit: Payer: Self-pay | Admitting: Family Medicine

## 2012-05-21 NOTE — Telephone Encounter (Signed)
refill TraMADol HCl (Tab) 50 MG Take 1 tablet (50 mg total) by mouth every 6 (six) hours as needed for pain. #45 last fill 7.19.13, last ov 6.18.13 acute

## 2012-05-21 NOTE — Telephone Encounter (Signed)
No refills.  Pt overdue for OV and none currently scheduled

## 2012-05-21 NOTE — Telephone Encounter (Signed)
Last OV 11-18-11, last filled 12-23-11 #45

## 2012-05-22 NOTE — Telephone Encounter (Signed)
Left message to call office

## 2012-06-04 ENCOUNTER — Other Ambulatory Visit: Payer: Self-pay | Admitting: Family Medicine

## 2012-06-04 MED ORDER — TRAMADOL HCL 50 MG PO TABS: 50.0000 mg | ORAL_TABLET | Freq: Four times a day (QID) | ORAL | Status: DC | PRN

## 2012-06-04 NOTE — Telephone Encounter (Signed)
Rx sent to the pharmacy by e-script.//AB/CMA 

## 2012-06-04 NOTE — Telephone Encounter (Signed)
Ok for #45, no refills 

## 2012-06-04 NOTE — Telephone Encounter (Signed)
refill TraMADol HCl (Tab) 50 MG Take 1 tablet (50 mg total) by mouth every 6 (six) hours as needed for pain. #45 last fill 7.19.13--last ov 6.14.13 acute

## 2012-06-04 NOTE — Telephone Encounter (Signed)
Please advise on RF request.//AB/CMA 

## 2012-06-05 NOTE — Telephone Encounter (Signed)
Left message to call office

## 2012-06-13 NOTE — Telephone Encounter (Signed)
See other encounter  Rx sent  °

## 2012-08-17 ENCOUNTER — Other Ambulatory Visit: Payer: Self-pay | Admitting: Obstetrics & Gynecology

## 2012-08-17 DIAGNOSIS — N6001 Solitary cyst of right breast: Secondary | ICD-10-CM

## 2012-08-17 DIAGNOSIS — N644 Mastodynia: Secondary | ICD-10-CM

## 2012-08-28 ENCOUNTER — Ambulatory Visit
Admission: RE | Admit: 2012-08-28 | Discharge: 2012-08-28 | Disposition: A | Discharge status: Home / Self Care | Payer: BC Managed Care – PPO | Source: Ambulatory Visit | Attending: Obstetrics & Gynecology | Admitting: Obstetrics & Gynecology

## 2012-08-28 DIAGNOSIS — N6001 Solitary cyst of right breast: Secondary | ICD-10-CM

## 2012-08-28 DIAGNOSIS — N644 Mastodynia: Secondary | ICD-10-CM

## 2012-08-30 ENCOUNTER — Other Ambulatory Visit: Payer: Self-pay | Admitting: Family Medicine

## 2012-08-31 NOTE — Telephone Encounter (Signed)
Last seen 11/18/11 and filled 12/23/11 #60 with 1. Please advise      KP//CMA

## 2012-10-02 ENCOUNTER — Other Ambulatory Visit: Payer: Self-pay | Admitting: Obstetrics & Gynecology

## 2012-10-02 ENCOUNTER — Ambulatory Visit
Admission: RE | Admit: 2012-10-02 | Discharge: 2012-10-02 | Disposition: A | Discharge status: Home / Self Care | Payer: BC Managed Care – PPO | Source: Ambulatory Visit | Attending: Obstetrics & Gynecology | Admitting: Obstetrics & Gynecology

## 2012-10-02 DIAGNOSIS — N920 Excessive and frequent menstruation with regular cycle: Secondary | ICD-10-CM

## 2012-10-17 ENCOUNTER — Telehealth: Payer: Self-pay | Admitting: Family Medicine

## 2012-10-17 NOTE — Telephone Encounter (Signed)
Left message to call office

## 2012-10-17 NOTE — Telephone Encounter (Signed)
Pt has ENDO and needs to contact them regarding a change to her insulin.  She needs to pay close attention to her carb intake

## 2012-10-17 NOTE — Telephone Encounter (Signed)
Patient Information:  Caller Name: Bishop  Phone: [phone]  Patient: Shannon Caldwell, Shannon Caldwell  Gender: Female  DOB: [birth year]  Age: 42 Years  PCP: Midge Minium  Pregnant: No  Office Follow Up:  Does the office need to follow up with this patient?: Yes  Instructions For The Office: Pt blood sugar 465 on new birth control pills   Symptoms  Reason For Call & Symptoms: Pt had IUD placed last October and never stopped bleeding since.   Recently pt started on new birth control pills 4 weeks ago and IUD has came out.   BCP was switched to higher dose due to continued bleeding and now BS is ranging 400-500 x 3 days.   10/17/12 at 1045 465, 5 units of Novalog, 36 units of Levemir.  Light headed and dizzy, blurred vision.  Reviewed Health History In EMR: Yes  Reviewed Medications In EMR: Yes  Reviewed Allergies In EMR: Yes  Reviewed Surgeries / Procedures: Yes  Date of Onset of Symptoms: 10/14/2012 OB / GYN:  LMP: Unknown  Guideline(s) Used:  Diabetes - High Blood Sugar  Disposition Per Guideline:   Discuss with PCP and Callback by Nurse within 1 Hour  Reason For Disposition Reached:   Blood glucose > 400 mg/dl (22 mmol/l)  Advice Given:  Call Back If:  You become worse.  Patient Will Follow Care Advice:  YES

## 2012-10-17 NOTE — Telephone Encounter (Signed)
Pt called to return your call. thanks

## 2012-10-17 NOTE — Telephone Encounter (Signed)
Duplicate see other encounter

## 2012-10-17 NOTE — Telephone Encounter (Signed)
Patient called back and agreed to follow up with Endo today.

## 2012-10-17 NOTE — Telephone Encounter (Signed)
Called pt to advise that Dr. Birdie Riddle has requested she follow up with her endocrinologist concerning the spike in her blood sugars and to watch her carb intake in the meantime. Encouraged to call with further questions or concerns.

## 2012-10-28 ENCOUNTER — Other Ambulatory Visit: Payer: Self-pay | Admitting: Family Medicine

## 2012-10-30 NOTE — Telephone Encounter (Signed)
Pt will need OV

## 2012-11-21 ENCOUNTER — Ambulatory Visit (HOSPITAL_COMMUNITY)
Admission: RE | Admit: 2012-11-21 | Payer: BC Managed Care – PPO | Source: Ambulatory Visit | Admitting: Obstetrics & Gynecology

## 2012-11-21 ENCOUNTER — Encounter (HOSPITAL_COMMUNITY): Admission: RE | Payer: Self-pay | Source: Ambulatory Visit

## 2012-11-21 SURGERY — ROBOTIC ASSISTED TOTAL HYSTERECTOMY
Anesthesia: General

## 2013-01-04 ENCOUNTER — Inpatient Hospital Stay (HOSPITAL_COMMUNITY)
Admission: AD | Admit: 2013-01-04 | Discharge: 2013-01-04 | Disposition: A | Discharge status: Home / Self Care | Payer: BC Managed Care – PPO | Source: Ambulatory Visit | Attending: Obstetrics & Gynecology | Admitting: Obstetrics & Gynecology

## 2013-01-04 ENCOUNTER — Encounter (HOSPITAL_COMMUNITY): Payer: Self-pay | Admitting: *Deleted

## 2013-01-04 DIAGNOSIS — R109 Unspecified abdominal pain: Secondary | ICD-10-CM | POA: Insufficient documentation

## 2013-01-04 DIAGNOSIS — N920 Excessive and frequent menstruation with regular cycle: Secondary | ICD-10-CM

## 2013-01-04 HISTORY — DX: Abnormal uterine and vaginal bleeding, unspecified: N93.9

## 2013-01-04 LAB — CBC
MCH: 28.3 pg (ref 26.0–34.0)
MCV: 84.8 fL (ref 78.0–100.0)
Platelets: 310 10*3/uL (ref 150–400)
RDW: 12 % (ref 11.5–15.5)
WBC: 6.1 10*3/uL (ref 4.0–10.5)

## 2013-01-04 MED ORDER — KETOROLAC TROMETHAMINE 60 MG/2ML IM SOLN
60.0000 mg | Freq: Once | INTRAMUSCULAR | Status: AC
  Administered 2013-01-04: 60 mg via INTRAMUSCULAR
  Filled 2013-01-04: qty 2

## 2013-01-04 MED ORDER — MEDROXYPROGESTERONE ACETATE 10 MG PO TABS: 10.0000 mg | ORAL_TABLET | Freq: Three times a day (TID) | ORAL | Status: DC

## 2013-01-04 MED ORDER — OXYCODONE-ACETAMINOPHEN 5-325 MG PO TABS: 1.0000 | ORAL_TABLET | Freq: Four times a day (QID) | ORAL | Status: DC | PRN

## 2013-01-04 NOTE — MAU Provider Note (Signed)
  History     CSN: [id number]  Arrival date and time: 01/04/13 1714   None     Chief Complaint  Patient presents with  . Vaginal Bleeding  . Abdominal Pain   HPI Long term menorrhagia, wants endometrial ablation and was set up for pre-op in office on 8/4. Here today with increased bleeding since stopping OCs, is currently on Provera. Also pain from cramps. Ultratram not workign but Vicodin has helped in past.    Past Medical History  Diagnosis Date  . Blood transfusion   . GERD (gastroesophageal reflux disease)   . Hyperlipidemia   . Hypertension   . Generalized headaches   . Cervical pain (neck)     due to abscess   . Diabetes mellitus, type 2     changed to type 1  . Abnormal vaginal bleeding     Past Surgical History  Procedure Laterality Date  . Cholecystectomy    . Appendectomy    . Pancreatic debridgement    . Cytocyst removal    . Hernia repair  August 2000, August [id number]    umbilical hernia     Family History  Problem Relation Age of Onset  . Hypertension Mother     grandfather  . Diabetes Mother     grandmother  . Stroke      grandparent  . Lung cancer Maternal Grandfather     History  Substance Use Topics  . Smoking status: Never Smoker   . Smokeless tobacco: Never Used  . Alcohol Use: No    Allergies:  Allergies  Allergen Reactions  . Morphine Hives and Itching    Prescriptions prior to admission  Medication Sig Dispense Refill  . hydrochlorothiazide (HYDRODIURIL) 25 MG tablet Take 25 mg by mouth daily.      Marland Kitchen ibuprofen (ADVIL,MOTRIN) 800 MG tablet Take 800 mg by mouth every 8 (eight) hours as needed for pain.       Marland Kitchen insulin aspart (NOVOLOG FLEXPEN) 100 UNIT/ML injection Inject 5 Units into the skin 3 (three) times daily before meals.      . insulin detemir (LEVEMIR FLEXPEN) 100 UNIT/ML injection Inject 35 Units into the skin 2 (two) times daily.        . medroxyPROGESTERone (PROVERA) 10 MG tablet Take 10 mg by mouth daily.      .  traMADol (ULTRAM) 50 MG tablet Take 1 tablet (50 mg total) by mouth every 6 (six) hours as needed for pain.  45 tablet  0    ROS Physical Exam   Blood pressure 160/86, pulse 83, temperature 98.5 F (36.9 C), temperature source Oral, resp. rate 18, height 5\' 5"  (1.651 m), weight 175 lb (79.379 kg).  Physical Exam Physical exam:  A&O x 3, no acute distress. Pleasant HEENT neg CV RRR, S1S2 normal Abdo soft, non tender, non acute Extr no edema/ tenderness Pelvic no active bleeding but period bleeding noted. Uterus normal size, non tender.    MAU Course  Procedures IM Toradol 60 mg    Assessment and Plan  Pain improved with Toradol. CBC stable. Will increase Provera 10mg  tid for 2 days and then bid until I see her in office, No OCs since started havign calf cramps with Ocs, so will defer Estrogen use.   Kazuo Durnil R 01/04/2013, 7:02 PM

## 2013-01-04 NOTE — MAU Note (Signed)
Patient states she has been having abnormal vaginal bleeding for about 10 months. Has been on several birth control options to stop the bleeding but does not work, Heavy bleeding with large blood clots and abdominal pain.

## 2013-03-15 ENCOUNTER — Ambulatory Visit: Payer: BC Managed Care – PPO | Admitting: Family Medicine

## 2013-03-15 DIAGNOSIS — Z0289 Encounter for other administrative examinations: Secondary | ICD-10-CM

## 2013-03-18 ENCOUNTER — Ambulatory Visit (INDEPENDENT_AMBULATORY_CARE_PROVIDER_SITE_OTHER): Payer: BC Managed Care – PPO | Admitting: Family Medicine

## 2013-03-18 ENCOUNTER — Encounter: Payer: Self-pay | Admitting: Family Medicine

## 2013-03-18 VITALS — BP 160/92 | HR 99 | Temp 98.1°F | Resp 16 | Wt 172.1 lb

## 2013-03-18 DIAGNOSIS — R1110 Vomiting, unspecified: Secondary | ICD-10-CM | POA: Insufficient documentation

## 2013-03-18 DIAGNOSIS — R1012 Left upper quadrant pain: Secondary | ICD-10-CM

## 2013-03-18 DIAGNOSIS — R197 Diarrhea, unspecified: Secondary | ICD-10-CM

## 2013-03-18 MED ORDER — ONDANSETRON 8 MG PO TBDP: 8.0000 mg | ORAL_TABLET | Freq: Three times a day (TID) | ORAL | Status: DC | PRN

## 2013-03-18 MED ORDER — TRAMADOL HCL 50 MG PO TABS: 50.0000 mg | ORAL_TABLET | Freq: Four times a day (QID) | ORAL | Status: DC | PRN

## 2013-03-18 NOTE — Progress Notes (Signed)
  Subjective:    Patient ID: Shannon Caldwell, female    DOB: 06/20/1970, 42 y.o.   MRN: [id number]  HPI sxs started 1 week ago w/ HA.  This subsided and then diarrhea and abdominal pain started.  + bilious vomiting.  Stools are now loose but started as watery.  No fevers.  + sick contacts w/ similar sxs.  CBGs running 300-400 since getting sick.  + decreased appetite.  HTN- chronic problem.   On HCTZ daily.  BP elevated today.  Denies CP, SOB, visual changes.   Review of Systems For ROS see HPI     Objective:   Physical Exam  Vitals reviewed. Constitutional: She is oriented to person, place, and time. She appears well-developed and well-nourished. No distress.  HENT:  Head: Normocephalic and atraumatic.  MMM  Neck: Neck supple.  Cardiovascular: Normal rate, regular rhythm and intact distal pulses.   Pulmonary/Chest: Effort normal and breath sounds normal. No respiratory distress. She has no wheezes. She has no rales.  Abdominal: Soft. She exhibits no distension. There is no tenderness. There is no rebound.  Hyperactive BS  Lymphadenopathy:    She has no cervical adenopathy.  Neurological: She is alert and oriented to person, place, and time.  Skin: Skin is warm and dry.          Assessment & Plan:

## 2013-03-18 NOTE — Assessment & Plan Note (Signed)
New.  Suspect viral illness- especially since pt works in [place] and multiple residents w/ similar sxs.  Start Zofran prn.  Check labs to assess for electrolyte disturbances or underlying cause.  Reviewed supportive care and red flags that should prompt return.  Pt expressed understanding and is in agreement w/ plan.

## 2013-03-18 NOTE — Patient Instructions (Signed)
Follow up in 2 weeks to recheck BP Start the Zofran as needed for nausea and to help slow the bowels We'll notify you of your lab results and make any changes if needed Drink LOTS of fluids REST! Call with any questions or concerns Hang in there!

## 2013-03-18 NOTE — Assessment & Plan Note (Signed)
New.  No obvious cause.  Not consistent w/ diverticulitis- pain is high under L ribs.  Check labs.  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  Reviewed supportive care and red flags that should prompt return.  Pt expressed understanding and is in agreement w/ plan.

## 2013-03-19 LAB — BASIC METABOLIC PANEL
CO2: 27 mEq/L (ref 19–32)
Chloride: 95 mEq/L — ABNORMAL LOW (ref 96–112)
Potassium: 4 mEq/L (ref 3.5–5.1)
Sodium: 131 mEq/L — ABNORMAL LOW (ref 135–145)

## 2013-03-19 LAB — CBC WITH DIFFERENTIAL/PLATELET
Basophils Absolute: 0 10*3/uL (ref 0.0–0.1)
Lymphocytes Relative: 24 % (ref 12.0–46.0)
Monocytes Relative: 4.3 % (ref 3.0–12.0)
Platelets: 318 10*3/uL (ref 150.0–400.0)
RDW: 12.5 % (ref 11.5–14.6)

## 2013-03-19 LAB — HEPATIC FUNCTION PANEL
ALT: 16 U/L (ref 0–35)
AST: 15 U/L (ref 0–37)
Alkaline Phosphatase: 73 U/L (ref 39–117)
Bilirubin, Direct: 0.1 mg/dL (ref 0.0–0.3)
Total Protein: 7.2 g/dL (ref 6.0–8.3)

## 2013-03-19 LAB — LIPASE: Lipase: 24 U/L (ref 11.0–59.0)

## 2013-12-18 ENCOUNTER — Emergency Department (HOSPITAL_BASED_OUTPATIENT_CLINIC_OR_DEPARTMENT_OTHER)
Admission: EM | Admit: 2013-12-18 | Discharge: 2013-12-18 | Disposition: A | Discharge status: Home / Self Care | Payer: No Typology Code available for payment source | Attending: Emergency Medicine | Admitting: Emergency Medicine

## 2013-12-18 ENCOUNTER — Emergency Department (HOSPITAL_BASED_OUTPATIENT_CLINIC_OR_DEPARTMENT_OTHER): Payer: No Typology Code available for payment source

## 2013-12-18 ENCOUNTER — Encounter (HOSPITAL_BASED_OUTPATIENT_CLINIC_OR_DEPARTMENT_OTHER): Payer: Self-pay | Admitting: Emergency Medicine

## 2013-12-18 DIAGNOSIS — S99919A Unspecified injury of unspecified ankle, initial encounter: Principal | ICD-10-CM

## 2013-12-18 DIAGNOSIS — M79604 Pain in right leg: Secondary | ICD-10-CM

## 2013-12-18 DIAGNOSIS — I10 Essential (primary) hypertension: Secondary | ICD-10-CM | POA: Insufficient documentation

## 2013-12-18 DIAGNOSIS — Y9289 Other specified places as the place of occurrence of the external cause: Secondary | ICD-10-CM | POA: Insufficient documentation

## 2013-12-18 DIAGNOSIS — Z794 Long term (current) use of insulin: Secondary | ICD-10-CM | POA: Insufficient documentation

## 2013-12-18 DIAGNOSIS — Z79899 Other long term (current) drug therapy: Secondary | ICD-10-CM | POA: Insufficient documentation

## 2013-12-18 DIAGNOSIS — E119 Type 2 diabetes mellitus without complications: Secondary | ICD-10-CM | POA: Insufficient documentation

## 2013-12-18 DIAGNOSIS — S99929A Unspecified injury of unspecified foot, initial encounter: Principal | ICD-10-CM

## 2013-12-18 DIAGNOSIS — W010XXA Fall on same level from slipping, tripping and stumbling without subsequent striking against object, initial encounter: Secondary | ICD-10-CM | POA: Insufficient documentation

## 2013-12-18 DIAGNOSIS — S8990XA Unspecified injury of unspecified lower leg, initial encounter: Secondary | ICD-10-CM | POA: Insufficient documentation

## 2013-12-18 DIAGNOSIS — Z8719 Personal history of other diseases of the digestive system: Secondary | ICD-10-CM | POA: Insufficient documentation

## 2013-12-18 DIAGNOSIS — Y9389 Activity, other specified: Secondary | ICD-10-CM | POA: Insufficient documentation

## 2013-12-18 MED ORDER — HYDROCODONE-ACETAMINOPHEN 5-325 MG PO TABS: 2.0000 | ORAL_TABLET | ORAL | Status: DC | PRN

## 2013-12-18 MED ORDER — IBUPROFEN 800 MG PO TABS: 800.0000 mg | ORAL_TABLET | Freq: Three times a day (TID) | ORAL | Status: DC

## 2013-12-18 MED ORDER — METHOCARBAMOL 500 MG PO TABS: 500.0000 mg | ORAL_TABLET | Freq: Two times a day (BID) | ORAL | Status: DC

## 2013-12-18 NOTE — ED Notes (Signed)
Patient was mowing grass on Friday, triped in a hole and fell on her right hip. Twisted her right ankle/leg. Now having pain in her entire right leg that has not resolved in the last few days. Patient is diabetic.

## 2013-12-18 NOTE — ED Provider Notes (Signed)
Medical screening examination/treatment/procedure(s) were performed by non-physician practitioner and as supervising physician I was immediately available for consultation/collaboration.   EKG Interpretation None       Jasper Riling. Alvino Chapel, MD 12/18/13 [phone]

## 2013-12-18 NOTE — ED Provider Notes (Signed)
CSN: [id number]     Arrival date & time 12/18/13  1209 History   First MD Initiated Contact with Patient 12/18/13 1215     Chief Complaint  Patient presents with  . Leg Injury     (Consider location/radiation/quality/duration/timing/severity/associated sxs/prior Treatment) Patient is a 43 y.o. female presenting with leg pain. The history is provided by the patient. No language interpreter was used.  Leg Pain Location:  Hip, knee and ankle Time since incident:  6 days Injury: yes   Mechanism of injury: fall   Fall:    Fall occurred:  Walking   Impact surface:  Grass   Entrapped after fall: no   Hip location:  R hip Knee location:  R knee Ankle location:  R ankle Pain details:    Quality:  Aching   Radiates to:  Does not radiate   Severity:  No pain   Onset quality:  Sudden   Timing:  Constant   Progression:  Worsening Chronicity:  New Dislocation: no   Foreign body present:  No foreign bodies Worsened by:  Nothing tried Ineffective treatments:  None tried Risk factors: no recent illness     Past Medical History  Diagnosis Date  . Blood transfusion   . GERD (gastroesophageal reflux disease)   . Hyperlipidemia   . Hypertension   . Generalized headaches   . Cervical pain (neck)     due to abscess   . Diabetes mellitus, type 2     changed to type 1  . Abnormal vaginal bleeding    Past Surgical History  Procedure Laterality Date  . Cholecystectomy    . Appendectomy    . Pancreatic debridgement    . Cytocyst removal    . Hernia repair  August 2000, August [id number]    umbilical hernia    Family History  Problem Relation Age of Onset  . Hypertension Mother     grandfather  . Diabetes Mother     grandmother  . Stroke      grandparent  . Lung cancer Maternal Grandfather    History  Substance Use Topics  . Smoking status: Never Smoker   . Smokeless tobacco: Never Used  . Alcohol Use: No   OB History   Grav Para Term Preterm Abortions TAB SAB Ect Mult  Living   4 3 3  1  1   3      Review of Systems  Musculoskeletal: Positive for joint swelling and myalgias.  All other systems reviewed and are negative.     Allergies  Morphine  Home Medications   Prior to Admission medications   Medication Sig Start Date End Date Taking? Authorizing Provider  hydrochlorothiazide (HYDRODIURIL) 25 MG tablet Take 25 mg by mouth daily.    Historical Provider, MD  ibuprofen (ADVIL,MOTRIN) 800 MG tablet Take 800 mg by mouth every 8 (eight) hours as needed for pain.     Historical Provider, MD  insulin aspart (NOVOLOG FLEXPEN) 100 UNIT/ML injection Inject 5 Units into the skin 3 (three) times daily before meals.    Historical Provider, MD  insulin detemir (LEVEMIR FLEXPEN) 100 UNIT/ML injection Inject 35 Units into the skin 2 (two) times daily.      Historical Provider, MD  medroxyPROGESTERone (PROVERA) 10 MG tablet Take 1 tablet (10 mg total) by mouth 3 (three) times daily. 01/04/13   Elveria Royals, MD  ondansetron (ZOFRAN ODT) 8 MG disintegrating tablet Take 1 tablet (8 mg total) by mouth every 8 (eight) hours  as needed for nausea. 03/18/13   Midge Minium, MD  oxyCODONE-acetaminophen (ROXICET) 5-325 MG per tablet Take 1 tablet by mouth every 6 (six) hours as needed for pain. 01/04/13   Elveria Royals, MD  traMADol (ULTRAM) 50 MG tablet Take 1 tablet (50 mg total) by mouth every 6 (six) hours as needed for pain. 03/18/13 03/18/14  Midge Minium, MD   BP 155/89  Pulse 97  Temp(Src) 98.2 F (36.8 C) (Oral)  Resp 16  Ht 5\' 4"  (1.626 m)  Wt 162 lb 4 oz (73.596 kg)  BMI 27.84 kg/m2  SpO2 99% Physical Exam  Nursing note and vitals reviewed. Constitutional: She is oriented to person, place, and time. She appears well-developed and well-nourished.  HENT:  Head: Normocephalic and atraumatic.  Eyes: EOM are normal.  Neck: Normal range of motion.  Cardiovascular: Normal rate and normal heart sounds.   Pulmonary/Chest: Effort normal.  Abdominal:  She exhibits no distension.  Musculoskeletal: Normal range of motion.  Neurological: She is alert and oriented to person, place, and time.  Psychiatric: She has a normal mood and affect.    ED Course  Procedures (including critical care time) Labs Review Labs Reviewed - No data to display  Imaging Review No results found.   EKG Interpretation None      MDM   Final diagnoses:  Leg pain, right   rx ibuprofen Robaxin Hydrocodone Schedule to see Dr. Barbaraann Barthel for evaluation    Fransico Meadow, PA-C 12/18/13 1333

## 2013-12-18 NOTE — Discharge Instructions (Signed)
Ankle Sprain °An ankle sprain is an injury to the strong, fibrous tissues (ligaments) that hold the bones of your ankle joint together.  °CAUSES °An ankle sprain is usually caused by a fall or by twisting your ankle. Ankle sprains most commonly occur when you step on the outer edge of your foot, and your ankle turns inward. People who participate in sports are more prone to these types of injuries.  °SYMPTOMS  °· Pain in your ankle. The pain may be present at rest or only when you are trying to stand or walk. °· Swelling. °· Bruising. Bruising may develop immediately or within 1 to 2 days after your injury. °· Difficulty standing or walking, particularly when turning corners or changing directions. °DIAGNOSIS  °Your caregiver will ask you details about your injury and perform a physical exam of your ankle to determine if you have an ankle sprain. During the physical exam, your caregiver will press on and apply pressure to specific areas of your foot and ankle. Your caregiver will try to move your ankle in certain ways. An X-ray exam may be done to be sure a bone was not broken or a ligament did not separate from one of the bones in your ankle (avulsion fracture).  °TREATMENT  °Certain types of braces can help stabilize your ankle. Your caregiver can make a recommendation for this. Your caregiver may recommend the use of medicine for pain. If your sprain is severe, your caregiver may refer you to a surgeon who helps to restore function to parts of your skeletal system (orthopedist) or a physical therapist. °HOME CARE INSTRUCTIONS  °· Apply ice to your injury for 1-2 days or as directed by your caregiver. Applying ice helps to reduce inflammation and pain. °¨ Put ice in a plastic bag. °¨ Place a towel between your skin and the bag. °¨ Leave the ice on for 15-20 minutes at a time, every 2 hours while you are awake. °· Only take over-the-counter or prescription medicines for pain, discomfort, or fever as directed by  your caregiver. °· Elevate your injured ankle above the level of your heart as much as possible for 2-3 days. °· If your caregiver recommends crutches, use them as instructed. Gradually put weight on the affected ankle. Continue to use crutches or a cane until you can walk without feeling pain in your ankle. °· If you have a plaster splint, wear the splint as directed by your caregiver. Do not rest it on anything harder than a pillow for the first 24 hours. Do not put weight on it. Do not get it wet. You may take it off to take a shower or bath. °· You may have been given an elastic bandage to wear around your ankle to provide support. If the elastic bandage is too tight (you have numbness or tingling in your foot or your foot becomes cold and blue), adjust the bandage to make it comfortable. °· If you have an air splint, you may blow more air into it or let air out to make it more comfortable. You may take your splint off at night and before taking a shower or bath. Wiggle your toes in the splint several times per day to decrease swelling. °SEEK MEDICAL CARE IF:  °· You have rapidly increasing bruising or swelling. °· Your toes feel extremely cold or you lose feeling in your foot. °· Your pain is not relieved with medicine. °SEEK IMMEDIATE MEDICAL CARE IF: °· Your toes are numb or blue. °·   You have severe pain that is increasing. °MAKE SURE YOU:  °· Understand these instructions. °· Will watch your condition. °· Will get help right away if you are not doing well or get worse. °Document Released: 05/23/2005 Document Revised: 02/15/2012 Document Reviewed: 06/04/2011 °ExitCare® Patient Information ©2015 [employer]. This information is not intended to replace advice given to you by your health care provider. Make sure you discuss any questions you have with your health care provider. °Knee Sprain °A knee sprain is a tear in one of the strong, fibrous tissues that connect the bones (ligaments) in your knee. The  severity of the sprain depends on how much of the ligament is torn. The tear can be either partial or complete. °CAUSES  °Often, sprains are a result of a fall or injury. The force of the impact causes the fibers of your ligament to stretch too much. This excess tension causes the fibers of your ligament to tear. °SIGNS AND SYMPTOMS  °You may have some loss of motion in your knee. Other symptoms include: °· Bruising. °· Pain in the knee area. °· Tenderness of the knee to the touch. °· Swelling. °DIAGNOSIS  °To diagnose a knee sprain, your health care provider will physically examine your knee. Your health care provider may also suggest an X-ray exam of your knee to make sure no bones are broken. °TREATMENT  °If your ligament is only partially torn, treatment usually involves keeping the knee in a fixed position (immobilization) or bracing your knee for activities that require movement for several weeks. To do this, your health care provider will apply a bandage, cast, or splint to keep your knee from moving and to support your knee during movement until it heals. For a partially torn ligament, the healing process usually takes 4-6 weeks. °If your ligament is completely torn, depending on which ligament it is, you may need surgery to reconnect the ligament to the bone or reconstruct it. After surgery, a cast or splint may be applied and will need to stay on your knee for 4-6 weeks while your ligament heals. °HOME CARE INSTRUCTIONS °· Keep your injured knee elevated to decrease swelling. °· To ease pain and swelling, apply ice to the injured area: °¨ Put ice in a plastic bag. °¨ Place a towel between your skin and the bag. °¨ Leave the ice on for 20 minutes, 2-3 times a day. °· Only take medicine for pain as directed by your health care provider. °· Do not leave your knee unprotected until pain and stiffness go away (usually 4-6 weeks). °· If you have a cast or splint, do not allow it to get wet. If you have been  instructed not to remove it, cover it with a plastic bag when you shower or bathe. Do not swim. °· Your health care provider may suggest exercises for you to do during your recovery to prevent or limit permanent weakness and stiffness. °SEEK IMMEDIATE MEDICAL CARE IF: °· Your cast or splint becomes damaged. °· Your pain becomes worse. °· You have significant pain, swelling, or numbness below the cast or splint. °MAKE SURE YOU: °· Understand these instructions. °· Will watch your condition. °· Will get help right away if you are not doing well or get worse. °Document Released: 05/23/2005 Document Revised: 03/13/2013 Document Reviewed: 01/02/2013 °ExitCare® Patient Information ©2015 [employer]. This information is not intended to replace advice given to you by your health care provider. Make sure you discuss any questions you have with your   health care provider. ° °

## 2013-12-18 NOTE — ED Notes (Signed)
Patient transported to X-ray 

## 2013-12-18 NOTE — ED Notes (Signed)
Pt returned from xray

## 2013-12-20 ENCOUNTER — Telehealth: Payer: Self-pay

## 2013-12-20 DIAGNOSIS — Z0000 Encounter for general adult medical examination without abnormal findings: Secondary | ICD-10-CM

## 2013-12-20 DIAGNOSIS — E119 Type 2 diabetes mellitus without complications: Secondary | ICD-10-CM

## 2013-12-20 NOTE — Telephone Encounter (Signed)
Diabetic Bundle  Left a message on patients vm asking pt to return call to the office.  Pt needs an appt to check BP with nurse and an appt for labwork  Lipid and A1C ordered

## 2013-12-20 NOTE — Telephone Encounter (Signed)
Pt needs to have at minimum a follow up appt with Tabori.

## 2013-12-25 ENCOUNTER — Ambulatory Visit: Payer: BC Managed Care – PPO | Admitting: Family Medicine

## 2013-12-25 DIAGNOSIS — Z0289 Encounter for other administrative examinations: Secondary | ICD-10-CM

## 2014-01-09 ENCOUNTER — Ambulatory Visit: Payer: No Typology Code available for payment source | Admitting: Family Medicine

## 2014-01-09 DIAGNOSIS — Z0289 Encounter for other administrative examinations: Secondary | ICD-10-CM

## 2014-01-16 ENCOUNTER — Encounter: Payer: Self-pay | Admitting: Family Medicine

## 2014-01-16 ENCOUNTER — Ambulatory Visit (INDEPENDENT_AMBULATORY_CARE_PROVIDER_SITE_OTHER): Payer: No Typology Code available for payment source | Admitting: Family Medicine

## 2014-01-16 VITALS — BP 140/90 | HR 83 | Temp 98.0°F | Resp 16 | Wt 168.0 lb

## 2014-01-16 DIAGNOSIS — B373 Candidiasis of vulva and vagina: Secondary | ICD-10-CM

## 2014-01-16 DIAGNOSIS — B3731 Acute candidiasis of vulva and vagina: Secondary | ICD-10-CM

## 2014-01-16 DIAGNOSIS — N760 Acute vaginitis: Secondary | ICD-10-CM

## 2014-01-16 DIAGNOSIS — I10 Essential (primary) hypertension: Secondary | ICD-10-CM

## 2014-01-16 DIAGNOSIS — E119 Type 2 diabetes mellitus without complications: Secondary | ICD-10-CM

## 2014-01-16 MED ORDER — INSULIN ASPART 100 UNIT/ML ~~LOC~~ SOLN: 5.0000 [IU] | Freq: Three times a day (TID) | SUBCUTANEOUS | Status: DC

## 2014-01-16 MED ORDER — FLUCONAZOLE 150 MG PO TABS: 150.0000 mg | ORAL_TABLET | Freq: Once | ORAL | Status: AC

## 2014-01-16 MED ORDER — INSULIN DETEMIR 100 UNIT/ML ~~LOC~~ SOLN: 35.0000 [IU] | Freq: Two times a day (BID) | SUBCUTANEOUS | Status: DC

## 2014-01-16 MED ORDER — TRAMADOL HCL 50 MG PO TABS: 50.0000 mg | ORAL_TABLET | Freq: Four times a day (QID) | ORAL | Status: DC | PRN

## 2014-01-16 NOTE — Patient Instructions (Signed)
Follow up in 1 month to recheck BP Restart the HCTZ daily for BP Restart the insulin as directed We'll call you with your Endo appt Take the Diflucan as directed Take the Tramadol as needed We'll notify you of your lab results and make any changes if needed Call with any questions or concerns Hang in there!!

## 2014-01-16 NOTE — Progress Notes (Signed)
   Subjective:    Patient ID: Shannon Caldwell, female    DOB: 15-Apr-1971, 43 y.o.   MRN: [id number]  HPI DM- chronic problem, pt was previously seeing Endo but switched jobs and her insurance lapsed.  Was seeing Dr Howell Rucks at [facility] but does not have appt scheduled.  Still using insulin up until 4 weeks ago when script ran out.  + nausea, no vomiting.  + yeast.  Was exercising regularly up until a few days ago when she started feeling 'weird'.  HTN- chronic problem, has been out of HCTZ 'awhile'.  No CP, SOB.  + HAs.  + blurry vision.   Review of Systems For ROS see HPI     Objective:   Physical Exam  Vitals reviewed. Constitutional: She is oriented to person, place, and time. She appears well-developed and well-nourished. No distress.  HENT:  Head: Normocephalic and atraumatic.  Eyes: Conjunctivae and EOM are normal. Pupils are equal, round, and reactive to light.  Neck: Normal range of motion. Neck supple. No thyromegaly present.  Cardiovascular: Normal rate, regular rhythm, normal heart sounds and intact distal pulses.   No murmur heard. Pulmonary/Chest: Effort normal and breath sounds normal. No respiratory distress.  Abdominal: Soft. She exhibits no distension. There is no tenderness.  Musculoskeletal: She exhibits no edema.  Lymphadenopathy:    She has no cervical adenopathy.  Neurological: She is alert and oriented to person, place, and time.  Skin: Skin is warm and dry.  Psychiatric: She has a normal mood and affect. Her behavior is normal.          Assessment & Plan:

## 2014-01-16 NOTE — Progress Notes (Signed)
Pre visit review using our clinic review tool, if applicable. No additional management support is needed unless otherwise documented below in the visit note. 

## 2014-01-17 ENCOUNTER — Telehealth: Payer: Self-pay

## 2014-01-17 ENCOUNTER — Telehealth: Payer: Self-pay | Admitting: Family Medicine

## 2014-01-17 LAB — BASIC METABOLIC PANEL
BUN: 10 mg/dL (ref 6–23)
CALCIUM: 9.9 mg/dL (ref 8.4–10.5)
CO2: 27 meq/L (ref 19–32)
Chloride: 98 mEq/L (ref 96–112)
Creatinine, Ser: 0.7 mg/dL (ref 0.4–1.2)
GFR: 122.53 mL/min (ref >60.00)
GLUCOSE: 304 mg/dL — AB (ref 70–99)
POTASSIUM: 3.8 meq/L (ref 3.5–5.1)
SODIUM: 131 meq/L — AB (ref 135–145)

## 2014-01-17 LAB — HEPATIC FUNCTION PANEL
ALBUMIN: 4.1 g/dL (ref 3.5–5.2)
ALK PHOS: 72 U/L (ref 39–117)
ALT: 12 U/L (ref 0–35)
AST: 15 U/L (ref 0–37)
BILIRUBIN DIRECT: 0 mg/dL (ref 0.0–0.3)
TOTAL PROTEIN: 7.4 g/dL (ref 6.0–8.3)
Total Bilirubin: 0.9 mg/dL (ref 0.2–1.2)

## 2014-01-17 LAB — CBC WITH DIFFERENTIAL/PLATELET
BASOS ABS: 0 10*3/uL (ref 0.0–0.1)
BASOS PCT: 0.4 % (ref 0.0–3.0)
EOS ABS: 0 10*3/uL (ref 0.0–0.7)
Eosinophils Relative: 0.4 % (ref 0.0–5.0)
HCT: 40.1 % (ref 36.0–46.0)
HEMOGLOBIN: 13.4 g/dL (ref 12.0–15.0)
LYMPHS ABS: 1.3 10*3/uL (ref 0.7–4.0)
Lymphocytes Relative: 23.2 % (ref 12.0–46.0)
MCHC: 33.4 g/dL (ref 30.0–36.0)
MCV: 87.9 fl (ref 78.0–100.0)
MONO ABS: 0.2 10*3/uL (ref 0.1–1.0)
Monocytes Relative: 4 % (ref 3.0–12.0)
NEUTROS ABS: 4.1 10*3/uL (ref 1.4–7.7)
Neutrophils Relative %: 72 % (ref 43.0–77.0)
Platelets: 313 10*3/uL (ref 150.0–400.0)
RBC: 4.57 Mil/uL (ref 3.87–5.11)
RDW: 12.9 % (ref 11.5–15.5)
WBC: 5.7 10*3/uL (ref 4.0–10.5)

## 2014-01-17 LAB — LIPID PANEL
CHOLESTEROL: 240 mg/dL — AB (ref 0–200)
HDL: 46.1 mg/dL (ref >39.00)
LDL Cholesterol: 177 mg/dL — ABNORMAL HIGH (ref 0–99)
NONHDL: 193.9
Total CHOL/HDL Ratio: 5
Triglycerides: 85 mg/dL (ref 0.0–149.0)
VLDL: 17 mg/dL (ref 0.0–40.0)

## 2014-01-17 LAB — TSH: TSH: 0.24 u[IU]/mL — ABNORMAL LOW (ref 0.35–4.50)

## 2014-01-17 LAB — HEMOGLOBIN A1C: HEMOGLOBIN A1C: 12.5 % — AB (ref 4.6–6.5)

## 2014-01-17 NOTE — Telephone Encounter (Signed)
Relevant patient education assigned to patient using Emmi. ° °

## 2014-01-17 NOTE — Telephone Encounter (Signed)
Caller name:Marchele Relation to pt: Call back number:[phone] Pharmacy:[facility]  On [address] [place]  Reason for call: Shannon Caldwell called to say that she needs the Levemir and the Novalog to be in pens and she also needs needles for those called in. She also needs a prescription for hydrochlorolhiazide sent in.

## 2014-01-17 NOTE — Assessment & Plan Note (Signed)
Chronic problem.  Pt has been off meds.  BP mildly elevated.  No on ACE or ARB- reason unclear as pt was previously seeing Endo.  Restart HCTZ.  Check labs.

## 2014-01-17 NOTE — Assessment & Plan Note (Signed)
New.  Suspect this is due to uncontrolled sugars.  Start Diflucan.

## 2014-01-17 NOTE — Assessment & Plan Note (Signed)
Chronic problem, pt has been noncompliant w/ treatment and f/u.  Needs to resume care w/ Endo ASAP.  Pt has been out of insulin x4 weeks and admits to elevated sugar, blurry vision, and 'not feeling right'.  Check labs.  Refills on Insulin provided.  Will follow closely.

## 2014-01-20 ENCOUNTER — Other Ambulatory Visit: Payer: Self-pay | Admitting: Family Medicine

## 2014-01-20 DIAGNOSIS — E785 Hyperlipidemia, unspecified: Secondary | ICD-10-CM

## 2014-01-20 MED ORDER — ATORVASTATIN CALCIUM 20 MG PO TABS: 20.0000 mg | ORAL_TABLET | Freq: Every day | ORAL | Status: DC

## 2014-01-20 NOTE — Telephone Encounter (Signed)
Rx was sent via on call and CAN

## 2014-01-21 ENCOUNTER — Telehealth: Payer: Self-pay | Admitting: Family Medicine

## 2014-01-21 NOTE — Telephone Encounter (Signed)
Caller name: Alisandra Relation to pt: self  Call back number: [phone]   Reason for call:  Pt requesting a referral to a dermatologist due to pt loosing hair, bald spots, hair thinning for the past week.  Pt has [employer]

## 2014-01-21 NOTE — Telephone Encounter (Signed)
Spoke with pt advised that endocrinology would handle this given the number [phone] to call and schedule as referral notes state that they could not reach pt.

## 2014-02-07 ENCOUNTER — Encounter: Payer: Self-pay | Admitting: Internal Medicine

## 2014-02-07 ENCOUNTER — Ambulatory Visit (INDEPENDENT_AMBULATORY_CARE_PROVIDER_SITE_OTHER): Payer: No Typology Code available for payment source | Admitting: Internal Medicine

## 2014-02-07 VITALS — BP 126/82 | HR 100 | Temp 98.6°F | Resp 12 | Ht 65.0 in | Wt 168.0 lb

## 2014-02-07 DIAGNOSIS — E119 Type 2 diabetes mellitus without complications: Secondary | ICD-10-CM

## 2014-02-07 MED ORDER — INSULIN DETEMIR 100 UNIT/ML ~~LOC~~ SOLN: 25.0000 [IU] | Freq: Two times a day (BID) | SUBCUTANEOUS | Status: DC

## 2014-02-07 MED ORDER — INSULIN ASPART 100 UNIT/ML ~~LOC~~ SOLN
7.0000 [IU] | Freq: Three times a day (TID) | SUBCUTANEOUS | Status: DC
Start: 2014-02-07 — End: 2014-02-14

## 2014-02-07 NOTE — Patient Instructions (Signed)
Please change the insulin regimen as follows: - Decrease Levemir to 25 units 2x a day  - Increase NovoLog to: 7 units with a small meal (start with this for a smoothie or a salad) 9 units with a larger meal 11 units with a large meal (if you are having dessert or eating out) Please restart Lipitor 20 mg daily. Try not to miss doses of NovoLog Please return in 1 month with your sugar log.   PATIENT INSTRUCTIONS FOR TYPE 2 DIABETES:  DIET AND EXERCISE Diet and exercise is an important part of diabetic treatment.  We recommended aerobic exercise in the form of brisk walking (working between 40-60% of maximal aerobic capacity, similar to brisk walking) for 150 minutes per week (such as 30 minutes five days per week) along with 3 times per week performing 'resistance' training (using various gauge rubber tubes with handles) 5-10 exercises involving the major muscle groups (upper body, lower body and core) performing 10-15 repetitions (or near fatigue) each exercise. Start at half the above goal but build slowly to reach the above goals. If limited by weight, joint pain, or disability, we recommend daily walking in a swimming pool with water up to waist to reduce pressure from joints while allow for adequate exercise.    BLOOD GLUCOSES Monitoring your blood glucoses is important for continued management of your diabetes. Please check your blood glucoses 2-4 times a day: fasting, before meals and at bedtime (you can rotate these measurements - e.g. one day check before the 3 meals, the next day check before 2 of the meals and before bedtime, etc.).   HYPOGLYCEMIA (low blood sugar) Hypoglycemia is usually a reaction to not eating, exercising, or taking too much insulin/ other diabetes drugs.  Symptoms include tremors, sweating, hunger, confusion, headache, etc. Treat IMMEDIATELY with 15 grams of Carbs:   4 glucose tablets    cup regular juice/soda   2 tablespoons raisins   4 teaspoons sugar   1  tablespoon honey Recheck blood glucose in 15 mins and repeat above if still symptomatic/blood glucose <100.  RECOMMENDATIONS TO REDUCE YOUR RISK OF DIABETIC COMPLICATIONS: * Take your prescribed MEDICATION(S) * Follow a DIABETIC diet: Complex carbs, fiber rich foods, (monounsaturated and polyunsaturated) fats * AVOID saturated/trans fats, high fat foods, >2,300 mg salt per day. * EXERCISE at least 5 times a week for 30 minutes or preferably daily.  * DO NOT SMOKE OR DRINK more than 1 drink a day. * Check your FEET every day. Do not wear tightfitting shoes. Contact us if you develop an ulcer * See your EYE doctor once a year or more if needed * Get a FLU shot once a year * Get a PNEUMONIA vaccine once before and once after age 58 years  GOALS:  * Your Hemoglobin A1c of <7%  * fasting sugars need to be <130 * after meals sugars need to be <180 (2h after you start eating) * Your Systolic BP should be [id number] or lower  * Your Diastolic BP should be 80 or lower  * Your HDL (Good Cholesterol) should be 40 or higher  * Your LDL (Bad Cholesterol) should be 100 or lower. * Your Triglycerides should be 150 or lower  * Your Urine microalbumin (kidney function) should be <30 * Your Body Mass Index should be 25 or lower   Please consider the following ways to cut down carbs and fat and increase fiber and micronutrients in your diet: - substitute whole grain for white  bread or pasta - substitute brown rice for white rice - substitute 90-calorie flat bread pieces for slices of bread when possible - substitute sweet potatoes or yams for white potatoes - substitute humus for margarine - substitute tofu for cheese when possible - substitute almond or rice milk for regular milk (would not drink soy milk daily due to concern for soy estrogen influence on breast cancer risk) - substitute dark chocolate for other sweets when possible - substitute water - can add lemon or orange slices for taste - for diet  sodas (artificial sweeteners will trick your body that you can eat sweets without getting calories and will lead you to overeating and weight gain in the long run) - do not skip breakfast or other meals (this will slow down the metabolism and will result in more weight gain over time)  - can try smoothies made from fruit and almond/rice milk in am instead of regular breakfast - can also try old-fashioned (not instant) oatmeal made with almond/rice milk in am - order the dressing on the side when eating salad at a restaurant (pour less than half of the dressing on the salad) - eat as little meat as possible - can try juicing, but should not forget that juicing will get rid of the fiber, so would alternate with eating raw veg./fruits or drinking smoothies - use as little oil as possible, even when using olive oil - can dress a salad with a mix of balsamic vinegar and lemon juice, for e.g. - use agave nectar, stevia sugar, or regular sugar rather than artificial sweateners - steam or broil/roast veggies  - snack on veggies/fruit/nuts (unsalted, preferably) when possible, rather than processed foods - reduce or eliminate aspartame in diet (it is in diet sodas, chewing gum, etc) Read the labels!  Try to read Dr. Janene Harvey book: "Program for Reversing Diabetes" for other ideas for healthy eating.
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
## 2014-02-11 ENCOUNTER — Other Ambulatory Visit: Payer: Self-pay | Admitting: *Deleted

## 2014-02-11 MED ORDER — INSULIN ASPART 100 UNIT/ML FLEXPEN: PEN_INJECTOR | SUBCUTANEOUS | Status: DC

## 2014-02-13 ENCOUNTER — Telehealth: Payer: Self-pay | Admitting: Family Medicine

## 2014-02-13 MED ORDER — ATORVASTATIN CALCIUM 20 MG PO TABS: 20.0000 mg | ORAL_TABLET | Freq: Every day | ORAL | Status: DC

## 2014-02-13 MED ORDER — HYDROCHLOROTHIAZIDE 25 MG PO TABS: 25.0000 mg | ORAL_TABLET | Freq: Every day | ORAL | Status: DC

## 2014-02-13 NOTE — Telephone Encounter (Signed)
Caller name: Analeigha Relation to pt: self  Call back number: Pharmacy: [facility] [phone]   Reason for call:   pt requesting refill hydrochlorothiazide (HYDRODIURIL) 25 MG tablet  & atorvastatin (LIPITOR) 20 MG tablet

## 2014-02-13 NOTE — Telephone Encounter (Signed)
Med filled.  

## 2014-02-14 ENCOUNTER — Telehealth: Payer: Self-pay | Admitting: *Deleted

## 2014-02-14 MED ORDER — INSULIN LISPRO 100 UNIT/ML (KWIKPEN): PEN_INJECTOR | SUBCUTANEOUS | Status: DC

## 2014-02-14 NOTE — Telephone Encounter (Signed)
Insurance covers Humalog. Medication change to Humalog.

## 2014-02-18 ENCOUNTER — Other Ambulatory Visit: Payer: Self-pay | Admitting: *Deleted

## 2014-02-18 NOTE — Telephone Encounter (Signed)
Changing to Humalog. Pt's insurance would not approve Novolog.

## 2014-03-24 ENCOUNTER — Ambulatory Visit: Payer: No Typology Code available for payment source | Admitting: Internal Medicine

## 2014-03-27 ENCOUNTER — Ambulatory Visit (INDEPENDENT_AMBULATORY_CARE_PROVIDER_SITE_OTHER): Payer: No Typology Code available for payment source | Admitting: Medical

## 2014-03-27 ENCOUNTER — Encounter: Payer: Self-pay | Admitting: Medical

## 2014-03-27 VITALS — BP 143/88 | HR 86 | Temp 98.5°F | Ht 64.75 in | Wt 171.2 lb

## 2014-03-27 DIAGNOSIS — J0100 Acute maxillary sinusitis, unspecified: Secondary | ICD-10-CM

## 2014-03-27 DIAGNOSIS — M791 Myalgia: Secondary | ICD-10-CM

## 2014-03-27 DIAGNOSIS — M609 Myositis, unspecified: Secondary | ICD-10-CM

## 2014-03-27 DIAGNOSIS — H6692 Otitis media, unspecified, left ear: Secondary | ICD-10-CM

## 2014-03-27 DIAGNOSIS — B3731 Acute candidiasis of vulva and vagina: Secondary | ICD-10-CM | POA: Insufficient documentation

## 2014-03-27 DIAGNOSIS — IMO0001 Reserved for inherently not codable concepts without codable children: Secondary | ICD-10-CM | POA: Insufficient documentation

## 2014-03-27 DIAGNOSIS — H6690 Otitis media, unspecified, unspecified ear: Secondary | ICD-10-CM | POA: Insufficient documentation

## 2014-03-27 DIAGNOSIS — R52 Pain, unspecified: Secondary | ICD-10-CM

## 2014-03-27 DIAGNOSIS — J029 Acute pharyngitis, unspecified: Secondary | ICD-10-CM | POA: Insufficient documentation

## 2014-03-27 DIAGNOSIS — B373 Candidiasis of vulva and vagina: Secondary | ICD-10-CM

## 2014-03-27 LAB — POCT RAPID STREP A (OFFICE): Rapid Strep A Screen: NEGATIVE

## 2014-03-27 MED ORDER — FLUCONAZOLE 150 MG PO TABS: 150.0000 mg | ORAL_TABLET | Freq: Once | ORAL | Status: DC

## 2014-03-27 MED ORDER — FLUTICASONE PROPIONATE 50 MCG/ACT NA SUSP: 2.0000 | Freq: Every day | NASAL | Status: DC

## 2014-03-27 MED ORDER — CEFDINIR 300 MG PO CAPS: 300.0000 mg | ORAL_CAPSULE | Freq: Two times a day (BID) | ORAL | Status: DC

## 2014-03-27 MED ORDER — BENZONATATE 100 MG PO CAPS: 100.0000 mg | ORAL_CAPSULE | Freq: Three times a day (TID) | ORAL | Status: DC | PRN

## 2014-03-27 NOTE — Progress Notes (Signed)
Subjective:    Patient ID: Shannon Caldwell, female    DOB: [birth year], 43 y.o.   MRN: [id number]  HPI  Pt in with acute onset today  of coughing, fatigue, ha,  and body aches x 2 days. No family or friends sick. Pt does work at nursing home. Pt also has diabetes.(Hx of high a1-c) Presently. She has white discharge with some itching.   LMP- hx of uterine ablation  Past Medical History  Diagnosis Date  . Blood transfusion   . GERD (gastroesophageal reflux disease)   . Hyperlipidemia   . Hypertension   . Generalized headaches   . Cervical pain (neck)     due to abscess   . Diabetes mellitus, type 2     changed to type 1  . Abnormal vaginal bleeding     History   Social History  . Marital Status: Single    Spouse Name: N/A    Number of Children: 3  . Years of Education: N/A   Occupational History  . 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 [employer]     during week   Social History Main Topics  . Smoking status: Never Smoker   . Smokeless tobacco: Never Used  . Alcohol Use: No  . Drug Use: No  . Sexual Activity: Not on file   Other Topics Concern  . Not on file   Social History Narrative   Daughter 61, son 70, son 2   Grandson lives with Patient    Past Surgical History  Procedure Laterality Date  . Cholecystectomy    . Appendectomy    . Pancreatic debridgement    . Cytocyst removal    . Hernia repair  August 2000, August [id number]    umbilical hernia     Family History  Problem Relation Age of Onset  . Hypertension Mother     grandfather  . Diabetes Mother     grandmother  . Stroke      grandparent  . Lung cancer Maternal Grandfather     Allergies  Allergen Reactions  . Morphine Hives and Itching    Current Outpatient Prescriptions on File Prior to Visit  Medication Sig Dispense Refill  . atorvastatin (LIPITOR) 20 MG tablet Take 1 tablet (20 mg total) by mouth daily.  30 tablet  6  . hydrochlorothiazide  (HYDRODIURIL) 25 MG tablet Take 1 tablet (25 mg total) by mouth daily.  30 tablet  6  . HYDROcodone-acetaminophen (NORCO/VICODIN) 5-325 MG per tablet Take 2 tablets by mouth every 4 (four) hours as needed.  20 tablet  0  . ibuprofen (ADVIL,MOTRIN) 800 MG tablet Take 1 tablet (800 mg total) by mouth 3 (three) times daily.  21 tablet  0  . insulin detemir (LEVEMIR) 100 UNIT/ML injection Inject 0.25 mLs (25 Units total) into the skin 2 (two) times daily. Pens, please.  15 mL  2  . insulin lispro (HUMALOG KWIKPEN) 100 UNIT/ML KiwkPen Inject 7 to 11 units into the skin 3 (three) times daily before meals as instructed.  15 mL  4  . methocarbamol (ROBAXIN) 500 MG tablet Take 1 tablet (500 mg total) by mouth 2 (two) times daily.  20 tablet  0  . ondansetron (ZOFRAN ODT) 8 MG disintegrating tablet Take 1 tablet (8 mg total) by mouth every 8 (eight) hours as needed for nausea.  30 tablet  1  . oxyCODONE-acetaminophen (ROXICET) 5-325 MG per tablet  Take 1 tablet by mouth every 6 (six) hours as needed for pain.  20 tablet  0  . traMADol (ULTRAM) 50 MG tablet Take 1 tablet (50 mg total) by mouth every 6 (six) hours as needed.  45 tablet  0  . [DISCONTINUED] mometasone (NASONEX) 50 MCG/ACT nasal spray Place 2 sprays into the nose daily.  17 g  2   No current facility-administered medications on file prior to visit.    BP 143/88  Pulse 86  Temp(Src) 98.5 F (36.9 C) (Oral)  Ht 5' 4.75" (1.645 m)  Wt 171 lb 3.2 oz (77.656 kg)  BMI 28.70 kg/m2  SpO2 96%     Review of Systems  Constitutional: Positive for chills and fatigue. Negative for fever and diaphoresis.  HENT: Positive for congestion, sinus pressure and sore throat. Negative for ear pain, facial swelling, nosebleeds, postnasal drip and rhinorrhea.   Respiratory: Positive for cough. Negative for chest tightness, shortness of breath and wheezing.   Cardiovascular: Negative for chest pain and palpitations.  Gastrointestinal: Negative for nausea,  vomiting, abdominal pain, diarrhea and constipation.       Some mild epigastric pain this morning.  Genitourinary: Negative.   Musculoskeletal:       Diffuse myalgias.   Skin: Negative.   Neurological: Positive for headaches. Negative for dizziness, facial asymmetry, weakness, light-headedness and numbness.       Mild ha.       Objective:   Physical Exam  General  Mental Status - Alert. General Appearance - Well groomed. Not in acute distress.  Skin Rashes- No Rashes.  HEENT Head- Normal. Ear Auditory Canal - Left- Normal. Right - Normal.Tympanic Membrane- Left- Red  Right- Normal. Eye Sclera/Conjunctiva- Left- Normal. Right- Normal. Nose & Sinuses Nasal Mucosa- Left- Not Boggy or Congested. Right- Not Boggy or Congested. Maxillary and frontal sinus pressure. Mouth & Throat Lips: Upper Lip- Normal: no dryness, cracking, pallor, cyanosis, or vesicular eruption. Lower Lip-Normal: no dryness, cracking, pallor, cyanosis or vesicular eruption. Buccal Mucosa- Bilateral- No Aphthous ulcers. Oropharynx- No Discharge or Erythema. Tonsils: Characteristics- Bilateral- mild Erythema + Congestion. Size/Enlargement- Bilateral- No enlargement. Discharge- bilateral-None.  Neck Neck- Supple. No Masses.   Chest and Lung Exam Auscultation: Breath Sounds:-Normal.  Cardiovascular Auscultation:Rythm- Regular.  Murmurs & Other Heart Sounds:Ausculatation of the heart reveal- No Murmurs.  Lymphatic Head & Neck General Head & Neck Lymphatics: Bilateral: Description-  Submandibular nodes enlarged and tender.         Assessment & Plan:

## 2014-03-27 NOTE — Assessment & Plan Note (Signed)
Cefdinir rx as well.

## 2014-03-27 NOTE — Patient Instructions (Signed)
You had acute onset of myalgias this am with cough as well as other symptoms. We did rapid strep and flu test today. Both test were negative.  On exam your left ear looks infected and you have sinus pressure/possible sinusitis on exam. I am prescribing cefdinir antibiotic, benzonatate for cough, and fluticasone nasal spray.  Take ibuprofen for myalgias.  I am prescribing diflucan for yeast infection which you described. I am prescribing 3 tabs since you are diabetic and uncontrolled most of the time.(also on antibiotic over next week)  Follow up in 7 days or as needed.

## 2014-03-27 NOTE — Assessment & Plan Note (Signed)
  Rx diflucan. 1 tab today and another at end of antibiotic course.

## 2014-03-27 NOTE — Assessment & Plan Note (Signed)
Rx: cefdinir °

## 2014-03-27 NOTE — Progress Notes (Signed)
Pre visit review using our clinic review tool, if applicable. No additional management support is needed unless otherwise documented below in the visit note. 

## 2014-03-27 NOTE — Assessment & Plan Note (Signed)
Acute onset. So did flu. Result was neg.

## 2014-03-27 NOTE — Assessment & Plan Note (Signed)
Very minimal by exam but coupled with recent myalgia did rapid strep. Result was neg.

## 2014-03-31 ENCOUNTER — Telehealth: Payer: Self-pay | Admitting: Family Medicine

## 2014-03-31 NOTE — Telephone Encounter (Signed)
Caller name: Chavy Relation to pt: Call back number:[phone] Pharmacy:  Reason for call:  Pt was seen on 10/22 and got a note to be out of work.  She is still sick, fever.  She wants to know if she can get dates added to the date to miss work. Please call to advise.

## 2014-04-01 NOTE — Telephone Encounter (Signed)
Called patient on yesterday. Advised to come in today for extended work note.

## 2014-04-07 ENCOUNTER — Encounter: Payer: Self-pay | Admitting: Medical

## 2014-04-16 ENCOUNTER — Telehealth: Payer: Self-pay | Admitting: Family Medicine

## 2014-04-16 NOTE — Telephone Encounter (Signed)
Caller name: Vashon Relation to pt: self Call back number: [phone] Pharmacy: [facility] on [address]  Reason for call:   Patient states that she still has symptoms from last visit and would like another round of diflucan called in.

## 2014-04-17 MED ORDER — FLUCONAZOLE 150 MG PO TABS: 150.0000 mg | ORAL_TABLET | Freq: Once | ORAL | Status: DC

## 2014-04-17 NOTE — Telephone Encounter (Signed)
For her yeast infection complaint and diflucan request post antibiotic. Will send in one more tablet. But if symptoms persist she needs to be rechecked.

## 2014-04-17 NOTE — Telephone Encounter (Signed)
Called patient to notify medication called in and if she continues to have symptoms she will need to be seen by provider. Patient agreed.

## 2014-04-18 ENCOUNTER — Other Ambulatory Visit: Payer: Self-pay | Admitting: *Deleted

## 2014-04-18 ENCOUNTER — Encounter: Payer: Self-pay | Admitting: Internal Medicine

## 2014-04-18 ENCOUNTER — Ambulatory Visit (INDEPENDENT_AMBULATORY_CARE_PROVIDER_SITE_OTHER): Payer: No Typology Code available for payment source | Admitting: Internal Medicine

## 2014-04-18 VITALS — BP 122/78 | HR 90 | Temp 98.1°F | Resp 12 | Wt 173.0 lb

## 2014-04-18 DIAGNOSIS — E139 Other specified diabetes mellitus without complications: Secondary | ICD-10-CM | POA: Insufficient documentation

## 2014-04-18 DIAGNOSIS — R7989 Other specified abnormal findings of blood chemistry: Secondary | ICD-10-CM | POA: Insufficient documentation

## 2014-04-18 DIAGNOSIS — E1165 Type 2 diabetes mellitus with hyperglycemia: Secondary | ICD-10-CM

## 2014-04-18 DIAGNOSIS — E891 Postprocedural hypoinsulinemia: Secondary | ICD-10-CM

## 2014-04-18 DIAGNOSIS — IMO0002 Reserved for concepts with insufficient information to code with codable children: Secondary | ICD-10-CM

## 2014-04-18 DIAGNOSIS — Z23 Encounter for immunization: Secondary | ICD-10-CM

## 2014-04-18 DIAGNOSIS — R946 Abnormal results of thyroid function studies: Secondary | ICD-10-CM

## 2014-04-18 DIAGNOSIS — E785 Hyperlipidemia, unspecified: Secondary | ICD-10-CM | POA: Insufficient documentation

## 2014-04-18 DIAGNOSIS — Z90410 Acquired total absence of pancreas: Secondary | ICD-10-CM

## 2014-04-18 NOTE — Progress Notes (Signed)
Patient ID: Shannon Caldwell, female   DOB: 02-07-1971, 43 y.o.   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 Last visit 41 mo ago.   Last hemoglobin A1c was: Lab Results  Component Value Date   HGBA1C 12.5* 01/16/2014   HGBA1C 10.5* 08/27/2011   HGBA1C * 04/10/2010    10.8 (NOTE)                                                                       According to the ADA Clinical Practice Recommendations for 2011, when HbA1c is used as a screening test:   >=6.5%   Diagnostic of Diabetes Mellitus           (if abnormal result  is confirmed)  5.7-6.4%   Increased risk of developing Diabetes Mellitus  References:Diagnosis and Classification of Diabetes Mellitus,Diabetes [id number] 1):S62-S69 and Standards of Medical Care in         Diabetes - 2011,Diabetes Care,2011,34  (Suppl 1):S11-S61.   Pt was on a regimen of: - Levemir 35 units 2x a day - NovoLog 5-10 units tid ac - 1-2 a day (not for smoothie, not for salad unless - only if adds chicken) Restarted after last HbA1c was back - was off for 1 mo prior to this.  She is now on: - Levemir to 25 units 2x a day  - NovoLog - only 1x a day as she does not have an appetite - only has 1 meal: 7 units with a small meal (start with this for a smoothie or a salad) 9 units with a larger meal 11 units with a large meal (if you are having dessert or eating out)  Pt checks her sugars 2-3 a day and they are better: - am: 190-200 >> 160s - 2h after b'fast: n/c - before lunch: n/c - 2h after lunch: n/c - before dinner: 200-290 >> 195 - 2h after dinner: n/c  - bedtime: n/c - nighttime: n/c No lows. Lowest sugar was 90; she has hypoglycemia awareness at 100.  Highest sugar was 320.  Pt's meals are: - Breakfast: no - Lunch: smoothie - Dinner: salad - Snacks: 2: PB  crackers, Chobani yoghurt Cannot eat a solid meal. She has nausea in am.  Exercises from 12 am to 2 am after work (works from 3 pm to 12 am): cardio, jogging 4x a week.  She works 2 jobs >> very stressed. She does not sleep much because of this. She will change her job at the beginning of 2016.  - no CKD, last BUN/creatinine:  Lab Results  Component Value Date   BUN 10 01/16/2014   CREATININE 0.7 01/16/2014   - last set of lipids: Lab Results  Component Value Date   CHOL 240* 01/16/2014   HDL 46.10 01/16/2014   LDLCALC 177* 01/16/2014   TRIG 85.0 01/16/2014   CHOLHDL 5 01/16/2014  She was on Lipitor >> stopped long time ago. We restarted 20 mg at last visit. She is tolerating it well. - last eye exam was in 2 years. No DR. Going again 05/2014. - no numbness and tingling in  her feet.  Had a recent low TSH: Lab Results  Component Value Date   TSH 0.24* 01/16/2014   TSH 1.603 08/27/2011   ROS: Constitutional: no weight gain/loss, + fatigue, + decreased appetite, + poor sleep, + excessive urination + nocturia Eyes: + occasional blurry vision, no xerophthalmia ENT: no sore throat, no nodules palpated in throat, no dysphagia/odynophagia, no hoarseness, + tinnitus Cardiovascular: no CP/SOB/palpitations/+ leg swelling Respiratory: no cough/SOB Gastrointestinal:+ heartburn and nausea. No diarrhea and constipation Musculoskeletal: no muscle/joint aches Skin: no rashes, + itching, + hair loss Neurological: no tremors/numbness/tingling/dizziness, + HA  PE: BP 122/78 mmHg  Pulse 90  Temp(Src) 98.1 F (36.7 C) (Oral)  Resp 12  Wt 173 lb (78.472 kg)  SpO2 96% Wt Readings from Last 3 Encounters:  04/18/14 173 lb (78.472 kg)  03/27/14 171 lb 3.2 oz (77.656 kg)  02/07/14 168 lb (76.204 kg)   Constitutional: slightly overweight, in NAD Eyes: PERRLA, EOMI, no exophthalmos ENT: moist mucous membranes, no thyromegaly, no cervical lymphadenopathy Cardiovascular: RRR, No  MRG Respiratory: CTA B Gastrointestinal: abdomen soft, NT, ND, BS+ Musculoskeletal: no deformities, strength intact in all 4 Skin: moist, warm, no rashes; + alopecia areata vertex Neurological: no tremor with outstretched hands, DTR normal in all 4  ASSESSMENT: 1. 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 Low TSH  3. HL  PLAN:  1. Patient with long-standing, uncontrolled diabetes, on basal-bolus insulin regimen, with better control after increasing her mealtime insulin, despite the fact that she is not taking the NovoLog as she should, 3 times a day, as she only eats one meal a day. Subsequently, she has a staircase effect of her sugars throughout the day, due to not enough mealtime insulin. We discussed about the fact that she needs more structure in her day. She is now working 2 jobs and barely sleeping, however starting beginning of next year, she was switched to only one job and hopefully she will have more structuring her day. - for now,I suggested to:  Patient Instructions  - Please continue Levemir to 25 units 2x a day  - Please try to eat and inject NovoLog 3x a day: 9 units with a small meal (start with this for a smoothie or a salad) 11 units with a larger meal 13 units with a large meal (if you are having dessert or eating out)  Please stop at the lab.  Please come back for a follow-up appointment in 1.5 months.   - continue to check sugars - check 3 times a day, rotating checks - advised for yearly eye exams >> needs a new one >> has an appointment scheduled - will give the flu vaccine today - we will check a hemoglobin A1c today - Return to clinic in 1.5 mo with sugar log   2. Low TSH - she had a mildly low TSH at last visit with PCP - we will repeat thyroid tests today  3. HL - we again reviewed lipid levels together - LDL very high, at 177 - at last visit, I advised to restart Lipitor 20 mg daily >> she did that and  she is tolerating it well, but she is wondering whether her fatigue is caused by Lipitor. I advised her to stop it for a week and then restart to see if this helps - reviewed latest LFTs >> normal - we will check a cholesterol level today - Continue Lipitor 20 mg  Office Visit on 04/18/2014  Component Date Value Ref  Range Status  . Cholesterol 04/18/2014 142  0 - 200 mg/dL Final   ATP III Classification       Desirable:  < 200 mg/dL               Borderline High:  200 - 239 mg/dL          High:  > = 240 mg/dL  . Triglycerides 04/18/2014 88.0  0.0 - 149.0 mg/dL Final   Normal:  <150 mg/dLBorderline High:  150 - 199 mg/dL  . HDL 04/18/2014 42.10  >39.00 mg/dL Final  . VLDL 04/18/2014 17.6  0.0 - 40.0 mg/dL Final  . LDL Cholesterol 04/18/2014 82  0 - 99 mg/dL Final  . Total CHOL/HDL Ratio 04/18/2014 3   Final                  Men          Women1/2 Average Risk     3.4          3.3Average Risk          5.0          4.42X Average Risk          9.6          7.13X Average Risk          15.0          11.0                      . NonHDL 04/18/2014 99.90   Final   NOTE:  Non-HDL goal should be 30 mg/dL higher than patient's LDL goal (i.e. LDL goal of < 70 mg/dL, would have non-HDL goal of < 100 mg/dL)  . Hgb A1c MFr Bld 04/18/2014 13.5* 4.6 - 6.5 % Final   Glycemic Control Guidelines for People with Diabetes:Non Diabetic:  <6%Goal of Therapy: <7%Additional Action Suggested:  >8%   . TSH 04/18/2014 1.01  0.35 - 4.50 uIU/mL Final  . Free T4 04/18/2014 1.01  0.60 - 1.60 ng/dL Final  . T3, Free 04/18/2014 2.6  2.3 - 4.2 pg/mL Final   LDL much improved after starting the statin >> continue 20 mg daily of Lipitor. Hemoglobin A1c very high. Needs improve compliance with her meals and taking her insulin doses. The sugars do appear improved lately,, so hopefully her next hemoglobin A1c is better. TFTs normal.

## 2014-04-18 NOTE — Patient Instructions (Signed)
-   Please continue Levemir to 25 units 2x a day  - Please try to eat and inject NovoLog 3x a day: 9 units with a small meal (start with this for a smoothie or a salad) 11 units with a larger meal 13 units with a large meal (if you are having dessert or eating out)  Please stop at the lab.  Please come back for a follow-up appointment in 1.5 months.

## 2014-04-21 LAB — LIPID PANEL
CHOL/HDL RATIO: 3
CHOLESTEROL: 142 mg/dL (ref 0–200)
HDL: 42.1 mg/dL (ref >39.00)
LDL Cholesterol: 82 mg/dL (ref 0–99)
NonHDL: 99.9
Triglycerides: 88 mg/dL (ref 0.0–149.0)
VLDL: 17.6 mg/dL (ref 0.0–40.0)

## 2014-04-21 LAB — T4, FREE: FREE T4: 1.01 ng/dL (ref 0.60–1.60)

## 2014-04-21 LAB — HEMOGLOBIN A1C: Hgb A1c MFr Bld: 13.5 % — ABNORMAL HIGH (ref 4.6–6.5)

## 2014-04-21 LAB — T3, FREE: T3, Free: 2.6 pg/mL (ref 2.3–4.2)

## 2014-04-21 LAB — TSH: TSH: 1.01 u[IU]/mL (ref 0.35–4.50)

## 2014-04-29 ENCOUNTER — Encounter: Payer: Self-pay | Admitting: Family Medicine

## 2014-04-29 ENCOUNTER — Ambulatory Visit (INDEPENDENT_AMBULATORY_CARE_PROVIDER_SITE_OTHER): Payer: No Typology Code available for payment source | Admitting: Family Medicine

## 2014-04-29 ENCOUNTER — Telehealth: Payer: Self-pay

## 2014-04-29 VITALS — BP 142/82 | HR 76 | Temp 98.1°F | Resp 16 | Ht 65.5 in | Wt 168.4 lb

## 2014-04-29 DIAGNOSIS — Z0000 Encounter for general adult medical examination without abnormal findings: Secondary | ICD-10-CM | POA: Insufficient documentation

## 2014-04-29 LAB — HEPATIC FUNCTION PANEL
ALK PHOS: 68 U/L (ref 39–117)
ALT: 12 U/L (ref 0–35)
AST: 16 U/L (ref 0–37)
Albumin: 4.1 g/dL (ref 3.5–5.2)
BILIRUBIN DIRECT: 0.1 mg/dL (ref 0.0–0.3)
Total Bilirubin: 1.4 mg/dL — ABNORMAL HIGH (ref 0.2–1.2)
Total Protein: 7.4 g/dL (ref 6.0–8.3)

## 2014-04-29 LAB — CBC WITH DIFFERENTIAL/PLATELET
BASOS PCT: 0.5 % (ref 0.0–3.0)
Basophils Absolute: 0 10*3/uL (ref 0.0–0.1)
EOS ABS: 0 10*3/uL (ref 0.0–0.7)
Eosinophils Relative: 0.2 % (ref 0.0–5.0)
HCT: 40.8 % (ref 36.0–46.0)
Hemoglobin: 13.4 g/dL (ref 12.0–15.0)
LYMPHS PCT: 18.7 % (ref 12.0–46.0)
Lymphs Abs: 1.2 10*3/uL (ref 0.7–4.0)
MCHC: 32.8 g/dL (ref 30.0–36.0)
MCV: 88.5 fl (ref 78.0–100.0)
Monocytes Absolute: 0.2 10*3/uL (ref 0.1–1.0)
Monocytes Relative: 3.7 % (ref 3.0–12.0)
NEUTROS PCT: 76.9 % (ref 43.0–77.0)
Neutro Abs: 5 10*3/uL (ref 1.4–7.7)
Platelets: 290 10*3/uL (ref 150.0–400.0)
RBC: 4.61 Mil/uL (ref 3.87–5.11)
RDW: 12.7 % (ref 11.5–15.5)
WBC: 6.5 10*3/uL (ref 4.0–10.5)

## 2014-04-29 LAB — POCT URINALYSIS DIPSTICK
BILIRUBIN UA: NEGATIVE
Blood, UA: NEGATIVE
KETONES UA: NEGATIVE
Leukocytes, UA: NEGATIVE
Nitrite, UA: NEGATIVE
Protein, UA: NEGATIVE
Spec Grav, UA: 1.025
Urobilinogen, UA: 0.2
pH, UA: 5

## 2014-04-29 LAB — BASIC METABOLIC PANEL
BUN: 7 mg/dL (ref 6–23)
CHLORIDE: 97 meq/L (ref 96–112)
CO2: 24 mEq/L (ref 19–32)
Calcium: 9 mg/dL (ref 8.4–10.5)
Creatinine, Ser: 0.7 mg/dL (ref 0.4–1.2)
GFR: 128.9 mL/min (ref >60.00)
Glucose, Bld: 305 mg/dL — ABNORMAL HIGH (ref 70–99)
Potassium: 4.2 mEq/L (ref 3.5–5.1)
Sodium: 131 mEq/L — ABNORMAL LOW (ref 135–145)

## 2014-04-29 LAB — VITAMIN D 25 HYDROXY (VIT D DEFICIENCY, FRACTURES): VITD: 15.48 ng/mL — ABNORMAL LOW (ref 30.00–100.00)

## 2014-04-29 MED ORDER — TRIAMCINOLONE ACETONIDE 0.1 % EX OINT: 1.0000 "application " | TOPICAL_OINTMENT | Freq: Two times a day (BID) | CUTANEOUS | Status: DC

## 2014-04-29 NOTE — Assessment & Plan Note (Addendum)
Pt's PE WNL w/ exception of toenail fungus and eczema patches.  UTD on GYN and Endo.  Check labs that were not done by Endo, including UA.  Anticipatory guidance provided.

## 2014-04-29 NOTE — Progress Notes (Signed)
   Subjective:    Patient ID: Shannon Caldwell, female    DOB: [birth year], 43 y.o.   MRN: [id number]  HPI CPE- UTD on GYN (Dr Benjie Karvonen), Endo Cruzita Lederer)   Review of Systems Patient reports no vision/ hearing changes, adenopathy,fever, weight change,  persistant/recurrent hoarseness , swallowing issues, chest pain, palpitations, edema, persistant/recurrent cough, hemoptysis, dyspnea (rest/exertional/paroxysmal nocturnal), gastrointestinal bleeding (melena, rectal bleeding), abdominal pain, significant heartburn, bowel changes, GU symptoms (dysuria, hematuria, incontinence), Gyn symptoms (abnormal  bleeding, pain),  syncope, focal weakness, memory loss, numbness & tingling, abnormal bruising or bleeding, anxiety, or depression.   + dry, patchy, itchy skin + patchy hair loss + fungal toenail infection    Objective:   Physical Exam General Appearance:    Alert, cooperative, no distress, appears stated age  Head:    Normocephalic, without obvious abnormality, atraumatic  Eyes:    PERRL, conjunctiva/corneas clear, EOM's intact, fundi    benign, both eyes  Ears:    Normal TM's and external ear canals, both ears  Nose:   Nares normal, septum midline, mucosa normal, no drainage    or sinus tenderness  Throat:   Lips, mucosa, and tongue normal; teeth and gums normal  Neck:   Supple, symmetrical, trachea midline, no adenopathy;    Thyroid: no enlargement/tenderness/nodules  Back:     Symmetric, no curvature, ROM normal, no CVA tenderness  Lungs:     Clear to auscultation bilaterally, respirations unlabored  Chest Wall:    No tenderness or deformity   Heart:    Regular rate and rhythm, S1 and S2 normal, no murmur, rub   or gallop  Breast Exam:    Deferred to GYN  Abdomen:     Soft, non-tender, bowel sounds active all four quadrants,    no masses, no organomegaly  Genitalia:    Deferred to GYN  Rectal:    Extremities:   Extremities normal, atraumatic, no cyanosis or edema  Pulses:   2+ and symmetric  all extremities  Skin:   Skin color, texture, turgor normal, eczematous patches on abdomen.  + toenail fungus  Lymph nodes:   Cervical, supraclavicular, and axillary nodes normal  Neurologic:   CNII-XII intact, normal strength, sensation and reflexes    throughout          Assessment & Plan:

## 2014-04-29 NOTE — Telephone Encounter (Signed)
Irs[place] [phone]  Shannon Caldwell called to say she forgot to mention that she has discomfort around right tonsil, and could Dr Birdie Riddle call something in. What are her urine results are.

## 2014-04-29 NOTE — Progress Notes (Signed)
Pre visit review using our clinic review tool, if applicable. No additional management support is needed unless otherwise documented below in the visit note. 

## 2014-04-29 NOTE — Patient Instructions (Signed)
Follow up in 6 months to recheck BP and cholesterol We'll notify you of your lab results and make any changes if needed Add Biotin over the counter to improve hair growth Use OTC Funginail for the toenails Try and make healthy food choices and get regular exercise- this will improve A1C Call with any questions or concerns Happy Holidays!

## 2014-04-30 ENCOUNTER — Other Ambulatory Visit: Payer: Self-pay | Admitting: General Practice

## 2014-04-30 MED ORDER — VITAMIN D (ERGOCALCIFEROL) 1.25 MG (50000 UNIT) PO CAPS: 50000.0000 [IU] | ORAL_CAPSULE | ORAL | Status: DC

## 2014-04-30 MED ORDER — METRONIDAZOLE 500 MG PO TABS: 500.0000 mg | ORAL_TABLET | Freq: Two times a day (BID) | ORAL | Status: DC

## 2014-04-30 NOTE — Telephone Encounter (Signed)
Cannot speak to tonsils b/c pt did not mention this during visit.  Most likely, she has a sore throat due to post-nasal drip but if she is worried about infection, she will need to be seen.  Urine is fine w/ exception of large amounts of sugar- we have to get the diabetes under control!

## 2014-04-30 NOTE — Telephone Encounter (Signed)
Pt.notified

## 2014-05-08 ENCOUNTER — Encounter: Payer: Self-pay | Admitting: Family Medicine

## 2014-05-08 ENCOUNTER — Ambulatory Visit (INDEPENDENT_AMBULATORY_CARE_PROVIDER_SITE_OTHER): Payer: No Typology Code available for payment source | Admitting: Family Medicine

## 2014-05-08 VITALS — BP 132/84 | HR 107 | Temp 98.1°F | Resp 17 | Wt 167.5 lb

## 2014-05-08 DIAGNOSIS — J0100 Acute maxillary sinusitis, unspecified: Secondary | ICD-10-CM

## 2014-05-08 MED ORDER — PROMETHAZINE-DM 6.25-15 MG/5ML PO SYRP: 5.0000 mL | ORAL_SOLUTION | Freq: Four times a day (QID) | ORAL | Status: DC | PRN

## 2014-05-08 MED ORDER — AMOXICILLIN 875 MG PO TABS: 875.0000 mg | ORAL_TABLET | Freq: Two times a day (BID) | ORAL | Status: DC

## 2014-05-08 NOTE — Progress Notes (Signed)
   Subjective:    Patient ID: Shannon Caldwell, female    DOB: 1970-08-27, 43 y.o.   MRN: [id number]  HPI URI- sxs started 1 week ago, 'instead of getting better, it's getting worse'.  + HA, thick green mucous, sore throat, swollen glands, chest discomfort, weak/tired, vomiting w/ eating, cough- productive.  Subjective fever w/ chills.  + sick contacts.  + tooth pain.  + facial swelling.   Review of Systems For ROS see HPI     Objective:   Physical Exam  Constitutional: She appears well-developed and well-nourished. No distress.  HENT:  Head: Normocephalic and atraumatic.  Right Ear: Tympanic membrane normal.  Left Ear: Tympanic membrane normal.  Nose: Mucosal edema and rhinorrhea present. Right sinus exhibits maxillary sinus tenderness and frontal sinus tenderness. Left sinus exhibits maxillary sinus tenderness and frontal sinus tenderness.  Mouth/Throat: Uvula is midline and mucous membranes are normal. Posterior oropharyngeal erythema present. No oropharyngeal exudate.  Eyes: Conjunctivae and EOM are normal. Pupils are equal, round, and reactive to light.  Neck: Normal range of motion. Neck supple.  Cardiovascular: Normal rate, regular rhythm and normal heart sounds.   Pulmonary/Chest: Effort normal and breath sounds normal. No respiratory distress. She has no wheezes.  Lymphadenopathy:    She has no cervical adenopathy.  Vitals reviewed.         Assessment & Plan:

## 2014-05-08 NOTE — Patient Instructions (Signed)
Follow up as needed Start the Amoxicillin twice daily as directed- take w/ food Drink plenty of fluids Mucinex DM for daytime cough Promethazine cough syrup for nights/weekend- will cause drowsiness REST! Call with any questions or concerns Happy Holidays!

## 2014-05-08 NOTE — Progress Notes (Signed)
Pre visit review using our clinic review tool, if applicable. No additional management support is needed unless otherwise documented below in the visit note. 

## 2014-05-08 NOTE — Assessment & Plan Note (Signed)
Pt's sxs and PE consistent w/ infxn.  Start abx.  Cough meds prn.  Reviewed supportive care and red flags that should prompt return.  Pt expressed understanding and is in agreement w/ plan.  

## 2014-05-16 ENCOUNTER — Telehealth: Payer: Self-pay | Admitting: Family Medicine

## 2014-05-16 MED ORDER — FLUCONAZOLE 150 MG PO TABS: 150.0000 mg | ORAL_TABLET | Freq: Once | ORAL | Status: DC

## 2014-05-16 NOTE — Telephone Encounter (Signed)
Ok for Diflucan 150mg  x1 dose.  May repeat in 3 days if needed.  #2, no refills

## 2014-05-16 NOTE — Telephone Encounter (Signed)
Ok or diflucan

## 2014-05-16 NOTE — Telephone Encounter (Signed)
Pt states amoxicillin (AMOXIL) 875 MG tablet is causing her to have a yeast infection and states MD advised her if she had any problems call and a rx will be sent to pharmacy.

## 2014-05-16 NOTE — Telephone Encounter (Signed)
Med filled and pt notified.  

## 2014-05-28 ENCOUNTER — Ambulatory Visit: Payer: No Typology Code available for payment source | Admitting: Internal Medicine

## 2014-06-26 ENCOUNTER — Other Ambulatory Visit: Payer: Self-pay | Admitting: Family Medicine

## 2014-06-26 NOTE — Telephone Encounter (Signed)
Pt last seen 05-08-14 (sinusitis) meds last filled on 05/16/14 due to yeast from ABX for sinus infection.

## 2014-08-15 ENCOUNTER — Other Ambulatory Visit: Payer: Self-pay | Admitting: General Practice

## 2014-08-15 MED ORDER — TRAMADOL HCL 50 MG PO TABS: 50.0000 mg | ORAL_TABLET | Freq: Four times a day (QID) | ORAL | Status: DC | PRN

## 2014-08-15 NOTE — Telephone Encounter (Signed)
Last OV 05-08-14 (sinus) Tramadol last filled 01-16-14 #45 with 0

## 2014-08-15 NOTE — Telephone Encounter (Signed)
Med filled and faxed.  

## 2014-09-10 ENCOUNTER — Telehealth: Payer: Self-pay | Admitting: Family Medicine

## 2014-09-10 MED ORDER — TRAMADOL HCL 50 MG PO TABS: 50.0000 mg | ORAL_TABLET | Freq: Four times a day (QID) | ORAL | Status: AC | PRN

## 2014-09-10 MED ORDER — IBUPROFEN 800 MG PO TABS: 800.0000 mg | ORAL_TABLET | Freq: Three times a day (TID) | ORAL | Status: DC

## 2014-09-10 NOTE — Telephone Encounter (Signed)
[place] for Ibuprofen 800mg  TID prn, #30 (cannot take w/ Mobic) Ok for Tramadol #30

## 2014-09-10 NOTE — Telephone Encounter (Signed)
Ok to send in the tramadol and ibuprofen?

## 2014-09-10 NOTE — Telephone Encounter (Signed)
meds filled and faxed, called pt and left a detailed message informing that ibuprofen and Mobic cannot be taken at the same time.

## 2014-09-10 NOTE — Telephone Encounter (Signed)
Caller name:  Annamae Relation to pt:  self Call back number: [phone] Pharmacy: [facility] on [address] in [place]  Reason for call:   Requesting a refill of tramadol and Ibuprofen 800mg . Went to [facility] last night and meds, flexeril and mobic that was given are not working. Was seen for lower back pain. Her insurance will be effective in a couple weeks. Appointment scheduled.

## 2014-10-01 ENCOUNTER — Ambulatory Visit: Payer: No Typology Code available for payment source | Admitting: Family Medicine

## 2014-10-02 NOTE — Telephone Encounter (Signed)
error:[id number] ° °

## 2014-10-30 ENCOUNTER — Ambulatory Visit: Payer: No Typology Code available for payment source | Admitting: Family Medicine

## 2014-10-30 DIAGNOSIS — Z0289 Encounter for other administrative examinations: Secondary | ICD-10-CM

## 2014-11-07 ENCOUNTER — Telehealth: Payer: Self-pay | Admitting: Family Medicine

## 2014-11-07 ENCOUNTER — Encounter: Payer: Self-pay | Admitting: Family Medicine

## 2014-11-07 NOTE — Telephone Encounter (Signed)
Yes- please charge 

## 2014-11-07 NOTE — Telephone Encounter (Signed)
Pt was no show for appt on 10/30/14- letter sent. Charge? °

## 2014-11-09 ENCOUNTER — Other Ambulatory Visit: Payer: Self-pay | Admitting: Family Medicine

## 2014-11-10 NOTE — Telephone Encounter (Signed)
Last OV 05-08-14 (sinus), no record of CPE on file.  Tramadol last filled 09-20-14 #30 with 0

## 2014-11-10 NOTE — Telephone Encounter (Signed)
Med filled and faxed.  

## 2014-11-10 NOTE — Telephone Encounter (Signed)
Tramadol refused, pt needs appt.

## 2014-12-01 ENCOUNTER — Other Ambulatory Visit: Payer: Self-pay

## 2015-01-09 ENCOUNTER — Ambulatory Visit (INDEPENDENT_AMBULATORY_CARE_PROVIDER_SITE_OTHER): Payer: Self-pay | Admitting: Medical

## 2015-01-09 VITALS — BP 145/80 | HR 95 | Temp 99.2°F | Ht 65.5 in | Wt 163.8 lb

## 2015-01-09 DIAGNOSIS — J0100 Acute maxillary sinusitis, unspecified: Secondary | ICD-10-CM

## 2015-01-09 DIAGNOSIS — H6501 Acute serous otitis media, right ear: Secondary | ICD-10-CM

## 2015-01-09 DIAGNOSIS — J029 Acute pharyngitis, unspecified: Secondary | ICD-10-CM

## 2015-01-09 LAB — POCT RAPID STREP A (OFFICE): Rapid Strep A Screen: NEGATIVE

## 2015-01-09 MED ORDER — MOXIFLOXACIN HCL 0.5 % OP SOLN: 1.0000 [drp] | Freq: Three times a day (TID) | OPHTHALMIC | Status: DC

## 2015-01-09 MED ORDER — IBUPROFEN 800 MG PO TABS: 800.0000 mg | ORAL_TABLET | Freq: Three times a day (TID) | ORAL | Status: DC

## 2015-01-09 MED ORDER — AMOXICILLIN-POT CLAVULANATE 875-125 MG PO TABS: 1.0000 | ORAL_TABLET | Freq: Two times a day (BID) | ORAL | Status: DC

## 2015-01-09 NOTE — Progress Notes (Signed)
   Subjective:    Patient ID: Shannon Caldwell, female    DOB: 09-Dec-1970, 44 y.o.   MRN: [id number]  HPI  Pt in st for one day. Mild last night. Then this am worse pain. Has ha with some rt ear pain as well. Her rt  Eye has  some matting  when she woke. Green crusty dc that was dried.  Pt cowoker was sick. She had close contact.  Feels mild fever. No chills or sweats.  LMP- hx of uterine ablation.    Review of Systems  Constitutional: Negative for fever, chills, diaphoresis and fatigue.  HENT: Positive for ear pain, sinus pressure and sore throat. Negative for congestion and nosebleeds.   Respiratory: Negative for cough, chest tightness, shortness of breath and wheezing.   Cardiovascular: Negative for chest pain and palpitations.  Gastrointestinal: Negative for abdominal pain.  Musculoskeletal: Negative for back pain.  Neurological: Positive for headaches.       Mild.  Hematological: Negative for adenopathy. Does not bruise/bleed easily.       Objective:   Physical Exam  General  Mental Status - Alert. General Appearance - Well groomed. Not in acute distress.  Skin Rashes- No Rashes.  HEENT Head- Normal. Ear Auditory Canal - Left- Normal. Right - Normal.Tympanic Membrane- Left- Normal. Right- red Eye Sclera/Conjunctiva- Left- Normal. Right- Rt upper lid mild swollen at margin of eye lid. No injection. No dc. Nose & Sinuses Nasal Mucosa- Left-  Not boggy or Congested. Right-  Not  boggy or Congested. Rt side maxillary sinus pressure. Mouth & Throat Lips: Upper Lip- Normal: no dryness, cracking, pallor, cyanosis, or vesicular eruption. Lower Lip-Normal: no dryness, cracking, pallor, cyanosis or vesicular eruption. Buccal Mucosa- Bilateral- No Aphthous ulcers. Oropharynx- No Discharge or Erythema. Tonsils: Characteristics- Bilateral- Erythema +  Congestion. Size/Enlargement- Bilateral- 1+  enlargement. Discharge- bilateral-None.  Neck Neck- Supple. No Masses.   Chest  and Lung Exam Auscultation: Breath Sounds:- even and unlabored,   Cardiovascular Auscultation:Rythm- Regular, rate and rhythm. Murmurs & Other Heart Sounds:Ausculatation of the heart reveal- No Murmurs.  Lymphatic Head & Neck General Head & Neck Lymphatics: Bilateral: Description- No Localized lymphadenopathy.  General Mental Status- Alert. General Appearance- Not in acute distress.   Skin General: Color- Normal Color. Moisture- Normal Moisture.  Neck Carotid Arteries- Normal color. Moisture- Normal Moisture. No carotid bruits. No JVD.  Chest and Lung Exam Auscultation: Breath Sounds:-Normal.  Cardiovascular Auscultation:Rythm- Regular. Murmurs & Other Heart Sounds:Auscultation of the heart reveals- No Murmurs.  Abdomen Inspection:-Inspeection Normal. Palpation/Percussion:Note:No mass. Palpation and Percussion of the abdomen reveal- Non Tender, Non Distended + BS, no rebound or guarding.    Neurologic Cranial Nerve exam:- CN III-XII intact(No nystagmus), symmetric smile. Strength:- 5/5 equal and symmetric strength both upper and lower extremities.       Assessment & Plan:  Your strep test was negative. However, your physical exam and clinical presentation is suspicious for strep and it is important to note that rapid strep test can be falsely negative. So I am going to give you augmentin antibiotic today based on your exam and clinical presentation.  Also by exam finding or rt OM and sinusitis. Rx augmentin for this as well.  Hx consistent with bacterial conjunctivitis. Rx vigamox  Resy, hydrate,  and warm salt water gargles.   If increasing ha with stiff neck then ED eval.  Refill of your ibuprofen.   Follow up in 7 days or as needed.

## 2015-01-09 NOTE — Progress Notes (Signed)
Pre visit review using our clinic review tool, if applicable. No additional management support is needed unless otherwise documented below in the visit note. 

## 2015-01-09 NOTE — Patient Instructions (Addendum)
Your strep test was negative. However, your physical exam and clinical presentation is suspicious for strep and it is important to note that rapid strep test can be falsely negative. So I am going to give you augmentin antibiotic today based on your exam and clinical presentation.  Also by exam finding or rt OM and sinusitis. Rx augmentin for this as well.  Hx consistent with bacterial conjunctivitis. Rx vigamox  Resy, hydrate,  and warm salt water gargles.   If increasing ha with stiff neck then ED eval.  Refill of your ibuprofen.   Follow up in 7 days or as needed.

## 2015-04-27 ENCOUNTER — Emergency Department (HOSPITAL_BASED_OUTPATIENT_CLINIC_OR_DEPARTMENT_OTHER)
Admission: EM | Admit: 2015-04-27 | Discharge: 2015-04-27 | Disposition: A | Discharge status: Home / Self Care | Payer: Self-pay | Attending: Emergency Medicine | Admitting: Emergency Medicine

## 2015-04-27 ENCOUNTER — Emergency Department (HOSPITAL_BASED_OUTPATIENT_CLINIC_OR_DEPARTMENT_OTHER): Payer: Self-pay

## 2015-04-27 ENCOUNTER — Encounter (HOSPITAL_BASED_OUTPATIENT_CLINIC_OR_DEPARTMENT_OTHER): Payer: Self-pay | Admitting: Emergency Medicine

## 2015-04-27 DIAGNOSIS — Z791 Long term (current) use of non-steroidal anti-inflammatories (NSAID): Secondary | ICD-10-CM | POA: Insufficient documentation

## 2015-04-27 DIAGNOSIS — B3731 Acute candidiasis of vulva and vagina: Secondary | ICD-10-CM

## 2015-04-27 DIAGNOSIS — I10 Essential (primary) hypertension: Secondary | ICD-10-CM | POA: Insufficient documentation

## 2015-04-27 DIAGNOSIS — Z8719 Personal history of other diseases of the digestive system: Secondary | ICD-10-CM | POA: Insufficient documentation

## 2015-04-27 DIAGNOSIS — R739 Hyperglycemia, unspecified: Secondary | ICD-10-CM

## 2015-04-27 DIAGNOSIS — Z3202 Encounter for pregnancy test, result negative: Secondary | ICD-10-CM | POA: Insufficient documentation

## 2015-04-27 DIAGNOSIS — E785 Hyperlipidemia, unspecified: Secondary | ICD-10-CM | POA: Insufficient documentation

## 2015-04-27 DIAGNOSIS — Z792 Long term (current) use of antibiotics: Secondary | ICD-10-CM | POA: Insufficient documentation

## 2015-04-27 DIAGNOSIS — B373 Candidiasis of vulva and vagina: Secondary | ICD-10-CM | POA: Insufficient documentation

## 2015-04-27 DIAGNOSIS — E1165 Type 2 diabetes mellitus with hyperglycemia: Secondary | ICD-10-CM | POA: Insufficient documentation

## 2015-04-27 DIAGNOSIS — Z794 Long term (current) use of insulin: Secondary | ICD-10-CM | POA: Insufficient documentation

## 2015-04-27 DIAGNOSIS — N760 Acute vaginitis: Secondary | ICD-10-CM | POA: Insufficient documentation

## 2015-04-27 DIAGNOSIS — B9689 Other specified bacterial agents as the cause of diseases classified elsewhere: Secondary | ICD-10-CM

## 2015-04-27 DIAGNOSIS — R1030 Lower abdominal pain, unspecified: Secondary | ICD-10-CM

## 2015-04-27 LAB — WET PREP, GENITAL
Sperm: NONE SEEN
TRICH WET PREP: NONE SEEN

## 2015-04-27 LAB — URINALYSIS, ROUTINE W REFLEX MICROSCOPIC
Bilirubin Urine: NEGATIVE
Glucose, UA: >1000 mg/dL — AB
Ketones, ur: NEGATIVE mg/dL
LEUKOCYTES UA: NEGATIVE
Nitrite: NEGATIVE
PROTEIN: 30 mg/dL — AB
Specific Gravity, Urine: 1.036 — ABNORMAL HIGH (ref 1.005–1.030)
pH: 6 (ref 5.0–8.0)

## 2015-04-27 LAB — PREGNANCY, URINE: PREG TEST UR: NEGATIVE

## 2015-04-27 LAB — CBC WITH DIFFERENTIAL/PLATELET
Basophils Absolute: 0.1 10*3/uL (ref 0.0–0.1)
Basophils Relative: 1 %
EOS ABS: 0 10*3/uL (ref 0.0–0.7)
EOS PCT: 0 %
HCT: 37.3 % (ref 36.0–46.0)
HEMOGLOBIN: 12.7 g/dL (ref 12.0–15.0)
LYMPHS ABS: 1.5 10*3/uL (ref 0.7–4.0)
Lymphocytes Relative: 19 %
MCH: 28.7 pg (ref 26.0–34.0)
MCHC: 34 g/dL (ref 30.0–36.0)
MCV: 84.2 fL (ref 78.0–100.0)
MONOS PCT: 5 %
Monocytes Absolute: 0.4 10*3/uL (ref 0.1–1.0)
NEUTROS PCT: 75 %
Neutro Abs: 5.8 10*3/uL (ref 1.7–7.7)
Platelets: 249 10*3/uL (ref 150–400)
RBC: 4.43 MIL/uL (ref 3.87–5.11)
RDW: 11.3 % — ABNORMAL LOW (ref 11.5–15.5)
WBC: 7.6 10*3/uL (ref 4.0–10.5)

## 2015-04-27 LAB — URINE MICROSCOPIC-ADD ON

## 2015-04-27 LAB — COMPREHENSIVE METABOLIC PANEL
ALK PHOS: 101 U/L (ref 38–126)
ALT: 8 U/L — ABNORMAL LOW (ref 14–54)
ANION GAP: 8 (ref 5–15)
AST: 14 U/L — ABNORMAL LOW (ref 15–41)
Albumin: 3.7 g/dL (ref 3.5–5.0)
BILIRUBIN TOTAL: 1.2 mg/dL (ref 0.3–1.2)
BUN: 9 mg/dL (ref 6–20)
CALCIUM: 9 mg/dL (ref 8.9–10.3)
CO2: 24 mmol/L (ref 22–32)
Chloride: 99 mmol/L — ABNORMAL LOW (ref 101–111)
Creatinine, Ser: 0.59 mg/dL (ref 0.44–1.00)
GFR calc non Af Amer: >60 mL/min (ref >60)
Glucose, Bld: 313 mg/dL — ABNORMAL HIGH (ref 65–99)
Potassium: 4.2 mmol/L (ref 3.5–5.1)
SODIUM: 131 mmol/L — AB (ref 135–145)
TOTAL PROTEIN: 7.5 g/dL (ref 6.5–8.1)

## 2015-04-27 LAB — LIPASE, BLOOD: LIPASE: 29 U/L (ref 11–51)

## 2015-04-27 LAB — CBG MONITORING, ED
Glucose-Capillary: 300 mg/dL — ABNORMAL HIGH (ref 65–99)
Glucose-Capillary: 270 mg/dL — ABNORMAL HIGH (ref 65–99)

## 2015-04-27 MED ORDER — KETOROLAC TROMETHAMINE 30 MG/ML IJ SOLN
30.0000 mg | Freq: Once | INTRAMUSCULAR | Status: AC
  Administered 2015-04-27: 30 mg via INTRAVENOUS
  Filled 2015-04-27: qty 1

## 2015-04-27 MED ORDER — FLUCONAZOLE 150 MG PO TABS
150.0000 mg | ORAL_TABLET | Freq: Every day | ORAL | Status: DC
Start: 2015-04-27 — End: 2015-07-21

## 2015-04-27 MED ORDER — HYDROCHLOROTHIAZIDE 25 MG PO TABS: 25.0000 mg | ORAL_TABLET | Freq: Every day | ORAL | Status: DC

## 2015-04-27 MED ORDER — SODIUM CHLORIDE 0.9 % IV BOLUS (SEPSIS)
1000.0000 mL | Freq: Once | INTRAVENOUS | Status: AC
  Administered 2015-04-27: 1000 mL via INTRAVENOUS

## 2015-04-27 MED ORDER — ONDANSETRON HCL 4 MG/2ML IJ SOLN
4.0000 mg | Freq: Once | INTRAMUSCULAR | Status: AC
  Administered 2015-04-27: 4 mg via INTRAVENOUS
  Filled 2015-04-27: qty 2

## 2015-04-27 MED ORDER — ONDANSETRON HCL 4 MG PO TABS: 4.0000 mg | ORAL_TABLET | Freq: Three times a day (TID) | ORAL | Status: DC | PRN

## 2015-04-27 MED ORDER — IOHEXOL 300 MG/ML  SOLN
100.0000 mL | Freq: Once | INTRAMUSCULAR | Status: AC | PRN
  Administered 2015-04-27: 100 mL via INTRAVENOUS

## 2015-04-27 MED ORDER — IOHEXOL 300 MG/ML  SOLN
25.0000 mL | Freq: Once | INTRAMUSCULAR | Status: AC | PRN
  Administered 2015-04-27: 25 mL via ORAL

## 2015-04-27 MED ORDER — METRONIDAZOLE 500 MG PO TABS: 500.0000 mg | ORAL_TABLET | Freq: Two times a day (BID) | ORAL | Status: DC

## 2015-04-27 MED ORDER — HYDROMORPHONE HCL 1 MG/ML IJ SOLN
1.0000 mg | Freq: Once | INTRAMUSCULAR | Status: AC
  Administered 2015-04-27: 1 mg via INTRAMUSCULAR
  Filled 2015-04-27: qty 1

## 2015-04-27 MED ORDER — ONDANSETRON 8 MG PO TBDP
8.0000 mg | ORAL_TABLET | Freq: Once | ORAL | Status: AC
  Administered 2015-04-27: 8 mg via ORAL
  Filled 2015-04-27: qty 1

## 2015-04-27 MED ORDER — METFORMIN HCL 500 MG PO TABS: 500.0000 mg | ORAL_TABLET | Freq: Two times a day (BID) | ORAL | Status: DC

## 2015-04-27 MED ORDER — IBUPROFEN 600 MG PO TABS: 600.0000 mg | ORAL_TABLET | Freq: Three times a day (TID) | ORAL | Status: DC | PRN

## 2015-04-27 NOTE — ED Notes (Signed)
Patient transported to CT 

## 2015-04-27 NOTE — ED Notes (Signed)
Clayton Bibles, EDPA notified of bladder scan results. VORB to obtain a post-void residual

## 2015-04-27 NOTE — ED Notes (Signed)
Patient in CT

## 2015-04-27 NOTE — ED Notes (Signed)
CT notified patient now has IV access.

## 2015-04-27 NOTE — ED Notes (Signed)
Patient returned from CT

## 2015-04-27 NOTE — ED Notes (Signed)
Raquel Sarna, [place] notified of pt's post-void residual

## 2015-04-27 NOTE — ED Notes (Signed)
Patient c/o left sided abd pain, headache, and nausea. Also requesting "if anything is going to be done about blood sugar." pain remains 6/10.

## 2015-04-27 NOTE — ED Notes (Signed)
[place] at bedside for IV placement, MD aware

## 2015-04-27 NOTE — ED Notes (Signed)
Patient states that she is having some congestion and urinary retention with a "strange" odor to it

## 2015-04-27 NOTE — ED Notes (Signed)
MD at bedside. 

## 2015-04-27 NOTE — ED Notes (Signed)
Attempt x 2 for piv site unsuccessful. Pt has hx of longterm IV therapy and past PICC placement

## 2015-04-27 NOTE — ED Provider Notes (Signed)
CSN: [id number]     Arrival date & time 04/27/15  1633 History   First MD Initiated Contact with Patient 04/27/15 1703     Chief Complaint  Patient presents with  . Urinary Retention     (Consider location/radiation/quality/duration/timing/severity/associated sxs/prior Treatment) The history is provided by the patient.     Pt with hx pancreatitis, DM not currently being treated, HTN, HLD, GERD s/p multiple abdominal surgeries (see below) p/w lower abdominal pain, difficulty urinating and emptying bladder, malodorous urine vs vaginal discharge, discomfort in the vagina, constipation, nausea, abdominal distension.  Symptoms have been ongoing 1 week.  Fever to 102 yesterday.  Recently had URI that is resolving, persistent nasal congestion and nasal discharge.  She is able to pass flatus. Does not have periods, had ablation in 2014 due to heavy bleeding.  Abdominal surgeries: 5 pancreatic debridements, cholecystectomy, appendectomy, hernia repair.    Past Medical History  Diagnosis Date  . Blood transfusion   . GERD (gastroesophageal reflux disease)   . Hyperlipidemia   . Hypertension   . Generalized headaches   . Cervical pain (neck)     due to abscess   . Diabetes mellitus, type 2 ([place])     changed to type 1  . Abnormal vaginal bleeding    Past Surgical History  Procedure Laterality Date  . Cholecystectomy    . Appendectomy    . Pancreatic debridgement    . Cytocyst removal    . Hernia repair  August 2000, August [id number]    umbilical hernia    Family History  Problem Relation Age of Onset  . Hypertension Mother     grandfather  . Diabetes Mother     grandmother  . Stroke      grandparent  . Lung cancer Maternal Grandfather    Social History  Substance Use Topics  . Smoking status: Never Smoker   . Smokeless tobacco: Never Used  . Alcohol Use: No   OB History    Gravida Para Term Preterm AB TAB SAB Ectopic Multiple Living   4 3 3  1  1   3      Review of Systems   All other systems reviewed and are negative.     Allergies  Morphine  Home Medications   Prior to Admission medications   Medication Sig Start Date End Date Taking? Authorizing Provider  amoxicillin (AMOXIL) 875 MG tablet Take 1 tablet (875 mg total) by mouth 2 (two) times daily. Patient not taking: Reported on 01/09/2015 05/08/14   Midge Minium, MD  amoxicillin-clavulanate (AUGMENTIN) 875-125 MG per tablet Take 1 tablet by mouth 2 (two) times daily. 01/09/15   Percell Miller Saguier, PA-C  atorvastatin (LIPITOR) 20 MG tablet Take 1 tablet (20 mg total) by mouth daily. Patient not taking: Reported on 01/09/2015 02/13/14   Midge Minium, MD  fluconazole (DIFLUCAN) 150 MG tablet Take 1 tablet (150 mg total) by mouth once. May repeat the dose in 2 days if necessary Patient not taking: Reported on 01/09/2015 05/16/14   Midge Minium, MD  hydrochlorothiazide (HYDRODIURIL) 25 MG tablet Take 1 tablet (25 mg total) by mouth daily. Patient not taking: Reported on 01/09/2015 02/13/14   Midge Minium, MD  HYDROcodone-acetaminophen (NORCO/VICODIN) 5-325 MG per tablet Take 2 tablets by mouth every 4 (four) hours as needed. Patient not taking: Reported on 01/09/2015 12/18/13   Fransico Meadow, PA-C  ibuprofen (ADVIL,MOTRIN) 800 MG tablet Take 1 tablet (800 mg total) by mouth 3 (three)  times daily. 01/09/15   Percell Miller Saguier, PA-C  insulin detemir (LEVEMIR) 100 UNIT/ML injection Inject 0.25 mLs (25 Units total) into the skin 2 (two) times daily. Pens, please. Patient not taking: Reported on 01/09/2015 02/07/14   Philemon Kingdom, MD  insulin lispro (HUMALOG KWIKPEN) 100 UNIT/ML KiwkPen Inject 7 to 11 units into the skin 3 (three) times daily before meals as instructed. 02/14/14   Philemon Kingdom, MD  methocarbamol (ROBAXIN) 500 MG tablet Take 1 tablet (500 mg total) by mouth 2 (two) times daily. Patient not taking: Reported on 01/09/2015 12/18/13   Fransico Meadow, PA-C  metroNIDAZOLE (FLAGYL) 500 MG tablet Take 1  tablet (500 mg total) by mouth 2 (two) times daily. Patient not taking: Reported on 01/09/2015 04/30/14   Midge Minium, MD  moxifloxacin (VIGAMOX) 0.5 % ophthalmic solution Place 1 drop into the right eye 3 (three) times daily. 01/09/15   Percell Miller Saguier, PA-C  ondansetron (ZOFRAN ODT) 8 MG disintegrating tablet Take 1 tablet (8 mg total) by mouth every 8 (eight) hours as needed for nausea. 03/18/13   Midge Minium, MD  oxyCODONE-acetaminophen (ROXICET) 5-325 MG per tablet Take 1 tablet by mouth every 6 (six) hours as needed for pain. Patient not taking: Reported on 01/09/2015 01/04/13   Azucena Fallen, MD  promethazine-dextromethorphan (PROMETHAZINE-DM) 6.25-15 MG/5ML syrup Take 5 mLs by mouth 4 (four) times daily as needed. Patient not taking: Reported on 01/09/2015 05/08/14   Midge Minium, MD  traMADol (ULTRAM) 50 MG tablet Take 1 tablet (50 mg total) by mouth every 6 (six) hours as needed. 09/10/14 09/10/15  Midge Minium, MD  triamcinolone ointment (KENALOG) 0.1 % Apply 1 application topically 2 (two) times daily. Patient not taking: Reported on 01/09/2015 04/29/14   Midge Minium, MD  Vitamin D, Ergocalciferol, (DRISDOL) 50000 UNITS CAPS capsule Take 1 capsule (50,000 Units total) by mouth every 7 (seven) days. Patient not taking: Reported on 01/09/2015 04/30/14   Midge Minium, MD   BP 176/99 mmHg  Pulse 92  Temp(Src) 98.5 F (36.9 C) (Oral)  Resp 18  Ht 5' 4.5" (1.638 m)  Wt 68.947 kg  BMI 25.70 kg/m2  SpO2 100% Physical Exam  Constitutional: She appears well-developed and well-nourished. No distress.  HENT:  Head: Normocephalic and atraumatic.  Neck: Neck supple.  Cardiovascular: Normal rate and regular rhythm.   Pulmonary/Chest: Effort normal and breath sounds normal. No respiratory distress. She has no wheezes. She has no rales.  Abdominal: Soft. She exhibits no distension. There is tenderness in the right lower quadrant, suprapubic area and left lower quadrant.  There is no rebound and no guarding.  Genitourinary: Uterus is tender. Cervix exhibits no motion tenderness. Right adnexum displays tenderness. Right adnexum displays no mass and no fullness. Left adnexum displays tenderness. Left adnexum displays no mass and no fullness. No tenderness in the vagina. No foreign body around the vagina.  Thick white discharge in clumps  Neurological: She is alert.  Skin: She is not diaphoretic.  Nursing note and vitals reviewed.   ED Course  Procedures (including critical care time) Labs Review Labs Reviewed  WET PREP, GENITAL - Abnormal; Notable for the following:    Yeast Wet Prep HPF POC PRESENT (*)    Clue Cells Wet Prep HPF POC PRESENT (*)    WBC, Wet Prep HPF POC MODERATE (*)    All other components within normal limits  URINALYSIS, ROUTINE W REFLEX MICROSCOPIC (NOT AT [facility]) - Abnormal; Notable for the following:  Specific Gravity, Urine 1.036 (*)    Glucose, UA >1000 (*)    Hgb urine dipstick TRACE (*)    Protein, ur 30 (*)    All other components within normal limits  URINE MICROSCOPIC-ADD ON - Abnormal; Notable for the following:    Squamous Epithelial / LPF 0-5 (*)    Bacteria, UA RARE (*)    All other components within normal limits  CBC WITH DIFFERENTIAL/PLATELET - Abnormal; Notable for the following:    RDW 11.3 (*)    All other components within normal limits  COMPREHENSIVE METABOLIC PANEL - Abnormal; Notable for the following:    Sodium 131 (*)    Chloride 99 (*)    Glucose, Bld 313 (*)    AST 14 (*)    ALT 8 (*)    All other components within normal limits  CBG MONITORING, ED - Abnormal; Notable for the following:    Glucose-Capillary 300 (*)    All other components within normal limits  URINE CULTURE  PREGNANCY, URINE  LIPASE, BLOOD  HIV ANTIBODY (ROUTINE TESTING)  GC/CHLAMYDIA PROBE AMP ([place]) NOT AT [facility]    Imaging Review Ct Abdomen Pelvis W Contrast  04/27/2015  CLINICAL DATA:  Left lower quadrant abdominal  pain for 1 week. Dysuria. Nausea. EXAM: CT ABDOMEN AND PELVIS WITH CONTRAST TECHNIQUE: Multidetector CT imaging of the abdomen and pelvis was performed using the standard protocol following bolus administration of intravenous contrast. CONTRAST:  11mL OMNIPAQUE IOHEXOL 300 MG/ML SOLN, 18mL OMNIPAQUE IOHEXOL 300 MG/ML SOLN COMPARISON:  11/20/2013 report FINDINGS: Lower chest: Mild subsegmental atelectasis in the posterior basal segments of both lower lobes. Hepatobiliary: Cholecystectomy. Pancreas: Severely atrophic pancreatic body and tail, possibly with a dilated duct in this vicinity as on image 34 series 2. Pancreatic head and adjacent portion of the pancreatic body appear intact and potentially even slightly hypertrophic. Appearance not changed from the MRCP from 05/06/2010. Spleen: Unremarkable Adrenals/Urinary Tract: Unremarkable Stomach/Bowel: Unremarkable Vascular/Lymphatic: Mild aortoiliac atherosclerotic vascular calcification. No pathologic adenopathy. Reproductive: Posterior enhancement in the uterine body measuring about 12 mm, probably from a fibroid on image 62 series 5. Endometrium measures up to 1.2 cm in thickness, and there is a linear 6 mm calcific density along the endometrium on image 66 series 6. Probable follicle in the left ovary on image 75 series 2. Right ovary is somewhat obscured against the uterine margin. Other: Possible ventral hernia mesh along the abdomen. Musculoskeletal:  Unremarkable IMPRESSION: 1. A cause for the patient's left lower quadrant abdominal pain and dysuria is not identified. The left kidney there is normal and no specific bowel abnormality is visible in this vicinity. There is a small complex lesion of the left ovary which is most likely a follicle, but the patient's pain is pelvic in origin than pelvic sonography may help in assessing this and some of the other findings, such as the suspected posterior uterine fibroid and the small calcification along the  endometrium. 2. Postoperative findings in the pancreas, with a severely atrophic pancreatic tail. Appearance similar to 05/06/2010. 3. Mild atherosclerosis. Electronically Signed   By: Van Clines M.D.   On: 04/27/2015 21:09     EKG Interpretation None      MDM   Final diagnoses:  Hyperglycemia  Lower abdominal pain  BV (bacterial vaginosis)  Vaginal yeast infection    Afebrile, nontoxic patient with 1 week of lower abdominal pain, urinary hesitancy, vaginal odor and discomfort, constipation, nausea.  Labs remarkable for hyperglycemia.  UA does  not appear infected.  Pt is not pregnant.  Wet prep with e/o BV and yeast infection.  IVF given to improve blood sugar.  Pt is not in DKA.  CT abd/pelvis shows what is likely a fibroid and also a left ovarian follicle.  Pt does not have focal tenderness on abdominal or pelvic exam.  D/C home with metformin, diflucan, refill of HCTZ by request, buprofen, flagyl, zofran.  Close PCP and gyn follow up.  Discussed adherence to diabetic diet with the patient.   Discussed result, findings, treatment, and follow up  with patient.  Pt given return precautions.  Pt verbalizes understanding and agrees with plan.       I doubt any other EMC precluding discharge at this time including, but not necessarily limited to the following: ovarian torsion, pyelonephritis, TOA/PID, diverticulitis.  Clayton Bibles, PA-C 04/28/15 0102  Debby Freiberg, MD 04/29/15 [phone]

## 2015-04-27 NOTE — Discharge Instructions (Signed)
Read the information below.  Use the prescribed medication as directed.  Please discuss all new medications with your pharmacist.  You may return to the Emergency Department at any time for worsening condition or any new symptoms that concern you.  If you develop high fevers, worsening abdominal pain, uncontrolled vomiting, or are unable to tolerate fluids by mouth, return to the ER for a recheck.      Abdominal Pain, Adult Many things can cause abdominal pain. Usually, abdominal pain is not caused by a disease and will improve without treatment. It can often be observed and treated at home. Your health care provider will do a physical exam and possibly order blood tests and X-rays to help determine the seriousness of your pain. However, in many cases, more time must pass before a clear cause of the pain can be found. Before that point, your health care provider may not know if you need more testing or further treatment. HOME CARE INSTRUCTIONS Monitor your abdominal pain for any changes. The following actions may help to alleviate any discomfort you are experiencing:  Only take over-the-counter or prescription medicines as directed by your health care provider.  Do not take laxatives unless directed to do so by your health care provider.  Try a clear liquid diet (broth, tea, or water) as directed by your health care provider. Slowly move to a bland diet as tolerated. SEEK MEDICAL CARE IF:  You have unexplained abdominal pain.  You have abdominal pain associated with nausea or diarrhea.  You have pain when you urinate or have a bowel movement.  You experience abdominal pain that wakes you in the night.  You have abdominal pain that is worsened or improved by eating food.  You have abdominal pain that is worsened with eating fatty foods.  You have a fever. SEEK IMMEDIATE MEDICAL CARE IF:  Your pain does not go away within 2 hours.  You keep throwing up (vomiting).  Your pain is felt  only in portions of the abdomen, such as the right side or the left lower portion of the abdomen.  You pass bloody or black tarry stools. MAKE SURE YOU:  Understand these instructions.  Will watch your condition.  Will get help right away if you are not doing well or get worse.   This information is not intended to replace advice given to you by your health care provider. Make sure you discuss any questions you have with your health care provider.   Document Released: 03/02/2005 Document Revised: 02/11/2015 Document Reviewed: 01/30/2013 Elsevier Interactive Patient Education 2016 Elsevier Inc.  Bacterial Vaginosis Bacterial vaginosis is a vaginal infection that occurs when the normal balance of bacteria in the vagina is disrupted. It results from an overgrowth of certain bacteria. This is the most common vaginal infection in women of childbearing age. Treatment is important to prevent complications, especially in pregnant women, as it can cause a premature delivery. CAUSES  Bacterial vaginosis is caused by an increase in harmful bacteria that are normally present in smaller amounts in the vagina. Several different kinds of bacteria can cause bacterial vaginosis. However, the reason that the condition develops is not fully understood. RISK FACTORS Certain activities or behaviors can put you at an increased risk of developing bacterial vaginosis, including:  Having a new sex partner or multiple sex partners.  Douching.  Using an intrauterine device (IUD) for contraception. Women do not get bacterial vaginosis from toilet seats, bedding, swimming pools, or contact with objects around them.  SIGNS AND SYMPTOMS  Some women with bacterial vaginosis have no signs or symptoms. Common symptoms include:  Grey vaginal discharge.  A fishlike odor with discharge, especially after sexual intercourse.  Itching or burning of the vagina and vulva.  Burning or pain with urination. DIAGNOSIS    Your health care provider will take a medical history and examine the vagina for signs of bacterial vaginosis. A sample of vaginal fluid may be taken. Your health care provider will look at this sample under a microscope to check for bacteria and abnormal cells. A vaginal pH test may also be done.  TREATMENT  Bacterial vaginosis may be treated with antibiotic medicines. These may be given in the form of a pill or a vaginal cream. A second round of antibiotics may be prescribed if the condition comes back after treatment. Because bacterial vaginosis increases your risk for sexually transmitted diseases, getting treated can help reduce your risk for chlamydia, gonorrhea, HIV, and herpes. HOME CARE INSTRUCTIONS   Only take over-the-counter or prescription medicines as directed by your health care provider.  If antibiotic medicine was prescribed, take it as directed. Make sure you finish it even if you start to feel better.  Tell all sexual partners that you have a vaginal infection. They should see their health care provider and be treated if they have problems, such as a mild rash or itching.  During treatment, it is important that you follow these instructions:  Avoid sexual activity or use condoms correctly.  Do not douche.  Avoid alcohol as directed by your health care provider.  Avoid breastfeeding as directed by your health care provider. SEEK MEDICAL CARE IF:   Your symptoms are not improving after 3 days of treatment.  You have increased discharge or pain.  You have a fever. MAKE SURE YOU:   Understand these instructions.  Will watch your condition.  Will get help right away if you are not doing well or get worse. FOR MORE INFORMATION  Centers for Disease Control and Prevention, Division of STD Prevention: AppraiserFraud.fi American Sexual Health Association (ASHA): www.ashastd.org    This information is not intended to replace advice given to you by your health care  provider. Make sure you discuss any questions you have with your health care provider.   Document Released: 05/23/2005 Document Revised: 06/13/2014 Document Reviewed: 01/02/2013 Elsevier Interactive Patient Education 2016 [place].  Hyperglycemia Hyperglycemia occurs when the glucose (sugar) in your blood is too high. Hyperglycemia can happen for many reasons, but it most often happens to people who do not know they have diabetes or are not managing their diabetes properly.  CAUSES  Whether you have diabetes or not, there are other causes of hyperglycemia. Hyperglycemia can occur when you have diabetes, but it can also occur in other situations that you might not be as aware of, such as: Diabetes  If you have diabetes and are having problems controlling your blood glucose, hyperglycemia could occur because of some of the following reasons:  Not following your meal plan.  Not taking your diabetes medications or not taking it properly.  Exercising less or doing less activity than you normally do.  Being sick. Pre-diabetes  This cannot be ignored. Before people develop Type 2 diabetes, they almost always have "pre-diabetes." This is when your blood glucose levels are higher than normal, but not yet high enough to be diagnosed as diabetes. Research has shown that some long-term damage to the body, especially the heart and circulatory system, may  already be occurring during pre-diabetes. If you take action to manage your blood glucose when you have pre-diabetes, you may delay or prevent Type 2 diabetes from developing. Stress  If you have diabetes, you may be "diet" controlled or on oral medications or insulin to control your diabetes. However, you may find that your blood glucose is higher than usual in the hospital whether you have diabetes or not. This is often referred to as "stress hyperglycemia." Stress can elevate your blood glucose. This happens because of hormones put out by the  body during times of stress. If stress has been the cause of your high blood glucose, it can be followed regularly by your caregiver. That way he/she can make sure your hyperglycemia does not continue to get worse or progress to diabetes. Steroids  Steroids are medications that act on the infection fighting system (immune system) to block inflammation or infection. One side effect can be a rise in blood glucose. Most people can produce enough extra insulin to allow for this rise, but for those who cannot, steroids make blood glucose levels go even higher. It is not unusual for steroid treatments to "uncover" diabetes that is developing. It is not always possible to determine if the hyperglycemia will go away after the steroids are stopped. A special blood test called an A1c is sometimes done to determine if your blood glucose was elevated before the steroids were started. SYMPTOMS  Thirsty.  Frequent urination.  Dry mouth.  Blurred vision.  Tired or fatigue.  Weakness.  Sleepy.  Tingling in feet or leg. DIAGNOSIS  Diagnosis is made by monitoring blood glucose in one or all of the following ways:  A1c test. This is a chemical found in your blood.  Fingerstick blood glucose monitoring.  Laboratory results. TREATMENT  First, knowing the cause of the hyperglycemia is important before the hyperglycemia can be treated. Treatment may include, but is not be limited to:  Education.  Change or adjustment in medications.  Change or adjustment in meal plan.  Treatment for an illness, infection, etc.  More frequent blood glucose monitoring.  Change in exercise plan.  Decreasing or stopping steroids.  Lifestyle changes. HOME CARE INSTRUCTIONS   Test your blood glucose as directed.  Exercise regularly. Your caregiver will give you instructions about exercise. Pre-diabetes or diabetes which comes on with stress is helped by exercising.  Eat wholesome, balanced meals. Eat often  and at regular, fixed times. Your caregiver or nutritionist will give you a meal plan to guide your sugar intake.  Being at an ideal weight is important. If needed, losing as little as 10 to 15 pounds may help improve blood glucose levels. SEEK MEDICAL CARE IF:   You have questions about medicine, activity, or diet.  You continue to have symptoms (problems such as increased thirst, urination, or weight gain). SEEK IMMEDIATE MEDICAL CARE IF:   You are vomiting or have diarrhea.  Your breath smells fruity.  You are breathing faster or slower.  You are very sleepy or incoherent.  You have numbness, tingling, or pain in your feet or hands.  You have chest pain.  Your symptoms get worse even though you have been following your caregiver's orders.  If you have any other questions or concerns.   This information is not intended to replace advice given to you by your health care provider. Make sure you discuss any questions you have with your health care provider.   Monilial Vaginitis Vaginitis in a soreness, swelling  and redness (inflammation) of the vagina and vulva. Monilial vaginitis is not a sexually transmitted infection. CAUSES  Yeast vaginitis is caused by yeast (candida) that is normally found in your vagina. With a yeast infection, the candida has overgrown in number to a point that upsets the chemical balance. SYMPTOMS   White, thick vaginal discharge.  Swelling, itching, redness and irritation of the vagina and possibly the lips of the vagina (vulva).  Burning or painful urination.  Painful intercourse. DIAGNOSIS  Things that may contribute to monilial vaginitis are:  Postmenopausal and virginal states.  Pregnancy.  Infections.  Being tired, sick or stressed, especially if you had monilial vaginitis in the past.  Diabetes. Good control will help lower the chance.  Birth control pills.  Tight fitting garments.  Using bubble bath, feminine sprays, douches  or deodorant tampons.  Taking certain medications that kill germs (antibiotics).  Sporadic recurrence can occur if you become ill. TREATMENT  Your caregiver will give you medication.  There are several kinds of anti monilial vaginal creams and suppositories specific for monilial vaginitis. For recurrent yeast infections, use a suppository or cream in the vagina 2 times a week, or as directed.  Anti-monilial or steroid cream for the itching or irritation of the vulva may also be used. Get your caregiver's permission.  Painting the vagina with methylene blue solution may help if the monilial cream does not work.  Eating yogurt may help prevent monilial vaginitis. HOME CARE INSTRUCTIONS   Finish all medication as prescribed.  Do not have sex until treatment is completed or after your caregiver tells you it is okay.  Take warm sitz baths.  Do not douche.  Do not use tampons, especially scented ones.  Wear cotton underwear.  Avoid tight pants and panty hose.  Tell your sexual partner that you have a yeast infection. They should go to their caregiver if they have symptoms such as mild rash or itching.  Your sexual partner should be treated as well if your infection is difficult to eliminate.  Practice safer sex. Use condoms.  Some vaginal medications cause latex condoms to fail. Vaginal medications that harm condoms are:  Cleocin cream.  Butoconazole (Femstat).  Terconazole (Terazol) vaginal suppository.  Miconazole (Monistat) (may be purchased over the counter). SEEK MEDICAL CARE IF:   You have a temperature by mouth above 102 F (38.9 C).  The infection is getting worse after 2 days of treatment.  The infection is not getting better after 3 days of treatment.  You develop blisters in or around your vagina.  You develop vaginal bleeding, and it is not your menstrual period.  You have pain when you urinate.  You develop intestinal problems.  You have pain  with sexual intercourse.   This information is not intended to replace advice given to you by your health care provider. Make sure you discuss any questions you have with your health care provider.   Document Released: 03/02/2005 Document Revised: 08/15/2011 Document Reviewed: 11/24/2014 Elsevier Interactive Patient Education 2016 [place] Released: 11/16/2000 Document Revised: 08/15/2011 Document Reviewed: 01/27/2015 Elsevier Interactive Patient Education 2016 [employer].    Emergency Department Resource Guide 1) Find a Doctor and Pay Out of Pocket Although you won't have to find out who is covered by your insurance plan, it is a good idea to ask around and get recommendations. You will then need to call the office and see if the doctor you have chosen will accept you as a new  patient and what types of options they offer for patients who are self-pay. Some doctors offer discounts or will set up payment plans for their patients who do not have insurance, but you will need to ask so you aren't surprised when you get to your appointment.  2) Contact Your Local Health Department Not all health departments have doctors that can see patients for sick visits, but many do, so it is worth a call to see if yours does. If you don't know where your local health department is, you can check in your phone book. The CDC also has a tool to help you locate your state's health department, and many state websites also have listings of all of their local health departments.  3) Find a [facility] If your illness is not likely to be very severe or complicated, you may want to try a walk in clinic. These are popping up all over the country in pharmacies, drugstores, and shopping centers. They're usually staffed by nurse practitioners or physician assistants that have been trained to treat common illnesses and complaints. They're usually fairly quick and inexpensive. However, if you have serious  medical issues or chronic medical problems, these are probably not your best option.  No Primary Care Doctor: - Call Health Connect at  [phone] - they can help you locate a primary care doctor that  accepts your insurance, provides certain services, etc. - Physician Referral Service- [phone]  Chronic Pain Problems: Organization         Address  Phone   Notes  [facility]  [phone] Patients need to be referred by their primary care doctor.   Medication Assistance: Organization         Address  Phone   Notes  [facility] Medication [facility] [address] [place], [place] [address] [phone] --Must be a resident of [facility] -- Must have NO insurance coverage whatsoever (no Medicaid/ Medicare, etc.) -- The pt. MUST have a primary care doctor that directs their care regularly and follows them in the community   MedAssist  [phone]   [employer]  [phone]    Agencies that provide inexpensive medical care: Organization         Address  Phone   Notes  [place]  [phone]   [facility]    [phone]   [facility] [address], [place] [address] [phone]   [place] [address] [place] [phone]   Planned Parenthood    [phone]   [facility]    [phone]   [place] and [address], [place] Phone:  [phone], Fax:  [phone] Hours of Operation:  9 am - 6 pm, M-F.  Also accepts Medicaid/Medicare and self-pay.  [facility] for [address], [address], [place] Phone: [phone], Fax: [phone]. Hours of Operation:  8:30 am - 5:30 pm, M-F.  Also accepts Medicaid and self-pay.  [facility] [place] [address] [place] Phone: [phone]   [place], [place], [place] [phone], Ext. [phone] Mondays & Thursdays: 7-9 AM.  First 15 patients are seen on a first come, first serve basis.    [place] Providers:  Organization         Address  Phone   Notes  [facility] [address],  [phone] Also accepts self-pay patients.  [facility] [id number] [place], [place]  [phone]   [place], [address], [place] [phone]   [facility] [address] [place] [phone]   Lucianne Lei [address], [place]   [phone] Only accepts [place] Access [place] patients after they have their name applied to their card.   Self-Pay (no insurance) in [facility]:  Organization         Address  Phone   Notes  Sickle Cell Patients, [facility] [place] [phone]   [facility] [place] [phone]   [facility] [place]  [place], [place], [place] [phone]   [facility]/Dr. Osei-Bonsu  [address], [place] or [address], [place] [phone] Phone number for both [place] and [place] locations is the same.  Urgent Medical and [facility] [address] [place] [phone]   [facility] [address] [place] or [address] [phone]   [facility] [address] [place] [phone], phone; [phone], fax Sees patients 1st and 3rd Saturday of every month.  Must not qualify for public or private insurance (i.e. Medicaid, Medicare, [place] Health Choice, Veterans' Benefits)  Household income should be no more than 200% of the poverty level The clinic cannot treat you if you are pregnant or think you are pregnant  Sexually  transmitted diseases are not treated at the clinic.    Dental Care: Organization         Address  Phone  Notes  [facility] Department of [facility] [place] [phone] Accepts children up to age 34 who are enrolled in [place] or [place]; pregnant women with a Medicaid card; and children who have applied for Medicaid or [place] Health Choice, but were declined, whose parents can pay a reduced fee at time of service.  [facility] Department of [facility]  [address], [place] [phone] Accepts children up to age 25 who are enrolled in [place] or [place]; pregnant women with a Medicaid card; and children who have applied for Medicaid or [place] Health Choice, but were declined, whose parents can pay a reduced fee at time of service.  [place] Adult Dental Access PROGRAM  [place] [phone] Patients are seen by appointment only. Walk-ins are not accepted. [place] will see patients 47 years of age and older. Monday - Tuesday (8am-5pm) Most Wednesdays (8:30-5pm) $30 per visit, cash only  [facility] Access [facility]  [address], [facility] [phone] Patients are seen by appointment only. Walk-ins are not accepted. [place] will see patients 4 years of age and older. One Wednesday Evening (Monthly: Volunteer Based).  $30 per visit, cash only  [place]  [phone] for adults; Children under age 3, call Graduate Pediatric Dentistry at [phone]. Children aged 53-14, please call [phone] to request a pediatric application.  Dental services are provided in all areas of dental care including fillings, crowns and  bridges, complete and partial dentures, implants, gum treatment, root canals, and extractions. Preventive care is also provided. Treatment is provided to both adults and children. Patients are  selected via a lottery and there is often a waiting list.   [facility] [address] [place]  [phone] [email or web address]   [facility] [address] [place], [place] [phone], Ext. [phone] Second and Fourth Thursday of each month, opens at 6:30 AM; Clinic ends at 9 AM.  Patients are seen on a first-come first-served basis, and a limited number are seen during each clinic.   [facility]  [address] [place] Danker [place], [place] [phone]   Eligibility Requirements You must have lived in [place], [place], or [place] counties for at least the last three months.   You cannot be eligible for state or federal sponsored [employer], including [employer], [place], or [employer].   You generally cannot be eligible for healthcare insurance through your employer.    How to apply: Eligibility screenings are held every Tuesday and Wednesday afternoon from 1:00 pm until 4:00 pm. You do not need an appointment for the interview!  [facility] [address] [place], [place]   [place]  [place] Department  [place]  [phone]    Behavioral Health Resources in the Community: Intensive Outpatient Programs Organization         Address  Phone  Notes  [place]. [address], [place], [place] [phone]   [facility] Outpatient [address] [place], [place]   ADS: Alcohol & Drug [facility] [address] [place], [place]   [place] [address]   [place], [place] or [phone]   Substance Abuse Resources Organization         Address  Phone  Notes  Alcohol and Drug Services  [phone]   [place]  [phone]   [facility] [place]   Chinita Pester  [phone]     Residential & Outpatient Substance Abuse Program  [phone]   Psychological Services Organization         Address  Phone  Notes  [facility]  [place]  [phone]   [place] [address] [place] or [phone]    Mobile Crisis Teams Organization         Address  Phone  Notes  [facility], Mobile Crisis Care Unit  [phone]   [facility]  [address] [place], [place]   [place] [address] [place] [phone]    Self-Help/Support Groups Organization         Address  Phone             Notes  [place]. of [place] - variety of support groups  [place] Call for more information  Narcotics Anonymous (NA), [facility] [address], [place]  2 meetings at this location   Special educational needs teacher         Address  Phone  Notes  ASAP Residential Treatment [place],    [place]  [phone]   [facility]  [address], [place] [id number], [place], [place]   [place], [place]  [phone] Admissions: 8am-3pm M-F  Incentives Substance [place] [address],    [place], [place] [phone]   [facility] [address] [place], [place], [place]   [facility] [address],  [place], [place]   [place] Programs - Intensive [facility] [address], [place], [place]   [facility]  [place], [place] [phone] or [phone]   [facility] [address] [place], [place] Accepts Medicaid  [facility] [address],  [place] [phone] Substance Abuse/Addiction Treatment   [facility]         Address  Phone  Notes  [facility]  [phone]   Domenic Schwab, PhD [address] [place], [place]   [phone] or [phone]   [facility] [place], [place] [phone]   [facility] [address] [place], [place] [phone] Insurance/Medicaid/sponsorship through [facility] and Families [address]                                    [place], [place] [phone] [place] [address] [place], [place] [phone]    Dr. Adele Schilder  [phone]   [facility] 1) [address], [place] 2) [place] 3)  [address], [place] [phone]  [phone]   [place] [phone] or [phone] (After Hours)

## 2015-04-27 NOTE — ED Notes (Signed)
PA at bedside.

## 2015-04-28 LAB — HIV ANTIBODY (ROUTINE TESTING W REFLEX): HIV SCREEN 4TH GENERATION: NONREACTIVE

## 2015-04-29 LAB — GC/CHLAMYDIA PROBE AMP (~~LOC~~) NOT AT ARMC
Chlamydia: NEGATIVE
NEISSERIA GONORRHEA: NEGATIVE

## 2015-04-30 LAB — URINE CULTURE: Culture: 30000

## 2015-05-01 ENCOUNTER — Telehealth (HOSPITAL_BASED_OUTPATIENT_CLINIC_OR_DEPARTMENT_OTHER): Payer: Self-pay | Admitting: Emergency Medicine

## 2015-05-01 NOTE — Telephone Encounter (Signed)
Post ED Visit - Positive Culture Follow-up: Successful Patient Follow-Up  Culture assessed and recommendations reviewed by: []  Elenor Quinones, Pharm.D. []  Heide Guile, Pharm.D., BCPS []  Parks Neptune, Pharm.D. []  Alycia Rossetti, Pharm.D., BCPS []  [place], [place].D., BCPS, AAHIVP []  Legrand Como, Pharm.D., BCPS, AAHIVP []  Milus Glazier, Pharm.D. [x]  Stephens November, Pharm.D.  Positive urine culture E. coli  []  Patient discharged without antimicrobial prescription and treatment is now indicated [x]  Organism is resistant to prescribed ED discharge antimicrobial []  Patient with positive blood cultures  Changes discussed with ED provider:[name] New antibiotic prescription if symptoms, Start Macrobid 100mg  po bid x 3 days attempting to contact patient    Hazle Nordmann 05/01/2015, 11:41 AM

## 2015-05-01 NOTE — Progress Notes (Signed)
ED Antimicrobial Stewardship Positive Culture Follow Up   Shannon Caldwell is an 44 y.o. female who presented to [facility] on 04/27/2015 with a chief complaint of  Chief Complaint  Patient presents with  . Urinary Retention    Recent Results (from the past 720 hour(s))  Urine culture     Status: None   Collection Time: 04/27/15  5:00 PM  Result Value Ref Range Status   Specimen Description URINE, CLEAN CATCH  Final   Special Requests NONE  Final   Culture   Final    30,000 COLONIES/mL ESCHERICHIA COLI Performed at [facility]    Report Status 04/30/2015 FINAL  Final   Organism ID, Bacteria ESCHERICHIA COLI  Final      Susceptibility   Escherichia coli - MIC*    AMPICILLIN <=2 SENSITIVE Sensitive     CEFAZOLIN <=4 SENSITIVE Sensitive     CEFTRIAXONE <=1 SENSITIVE Sensitive     CIPROFLOXACIN <=0.25 SENSITIVE Sensitive     GENTAMICIN <=1 SENSITIVE Sensitive     IMIPENEM <=0.25 SENSITIVE Sensitive     NITROFURANTOIN <=16 SENSITIVE Sensitive     TRIMETH/SULFA <=20 SENSITIVE Sensitive     AMPICILLIN/SULBACTAM <=2 SENSITIVE Sensitive     PIP/TAZO <=4 SENSITIVE Sensitive     * 30,000 COLONIES/mL ESCHERICHIA COLI  Wet prep, genital     Status: Abnormal   Collection Time: 04/27/15  7:05 PM  Result Value Ref Range Status   Yeast Wet Prep HPF POC PRESENT (A) NONE SEEN Final   Trich, Wet Prep NONE SEEN NONE SEEN Final   Clue Cells Wet Prep HPF POC PRESENT (A) NONE SEEN Final   WBC, Wet Prep HPF POC MODERATE (A) NONE SEEN Final   Sperm NONE SEEN  Final   Present with lower abdominal pain, urinary hesitancy, vaginal odor/discomfort, constipation, and nausea x1 wk. Exam reveal pain across entire lower abdomen and thick, whit, clumping vaginal discharge. Pt hypertensive but, otherwise vitals WNL. WBC WNL. UA shows rare bacteria negative leukocytes and negative nitrites. Urine culture grew 30K CFU pan-sensitive E.coli . Wet prep revealed presence of yeast and clue cells. Chlamydia,  gonorrhea, HIV neg. Prescribed fluconazole and metronidazole outpatient. Discussed with Domenic Moras, PA-C and agrees to follow up on patient's urinary symptoms. If improving, continue current antibiotics. If still symptomatic, will prescribe macrobid 100mg  BID x3 days.  ED Provider: Domenic Moras, PA-C   Judieth Keens, PharmD. 05/01/2015, 8:40 AM PGY1 Resident Phone# [phone]

## 2015-05-02 ENCOUNTER — Telehealth (HOSPITAL_BASED_OUTPATIENT_CLINIC_OR_DEPARTMENT_OTHER): Payer: Self-pay | Admitting: Emergency Medicine

## 2015-05-03 ENCOUNTER — Telehealth: Payer: Self-pay | Admitting: Emergency Medicine

## 2015-05-03 ENCOUNTER — Telehealth (HOSPITAL_BASED_OUTPATIENT_CLINIC_OR_DEPARTMENT_OTHER): Payer: Self-pay | Admitting: Emergency Medicine

## 2015-05-03 NOTE — Telephone Encounter (Signed)
Post ED Visit - Positive Culture Follow-up: Successful Patient Follow-Up  Culture assessed and recommendations reviewed by: []  Elenor Quinones, Pharm.D. []  Heide Guile, Pharm.D., BCPS []  Parks Neptune, Pharm.D. []  Alycia Rossetti, Pharm.D., BCPS []  [place], Pharm.D., BCPS, AAHIVP []  Legrand Como, Pharm.D., BCPS, AAHIVP []  Milus Glazier, Pharm.D. [x]  Stephens November, [place].D.  Positive urine culture E. Coli  [x]  Patient discharged without antimicrobial prescription and treatment is now indicated []  Organism is resistant to prescribed ED discharge antimicrobial []  Patient with positive blood cultures  Changes discussed with ED provider: Domenic Moras PA New antibiotic prescription Macrobid 100mg  po bid x 3 days Called to [facility] [address] [phone]  Contacted patient, 05/03/15 0955   Shannon Caldwell 05/03/2015, 9:53 AM

## 2015-07-21 ENCOUNTER — Other Ambulatory Visit: Payer: Self-pay | Admitting: General Practice

## 2015-07-21 ENCOUNTER — Encounter: Payer: Self-pay | Admitting: Family Medicine

## 2015-07-21 ENCOUNTER — Ambulatory Visit (INDEPENDENT_AMBULATORY_CARE_PROVIDER_SITE_OTHER): Payer: Managed Care, Other (non HMO) | Admitting: Family Medicine

## 2015-07-21 VITALS — BP 130/90 | HR 95 | Temp 98.2°F | Ht 65.5 in | Wt 163.8 lb

## 2015-07-21 DIAGNOSIS — R339 Retention of urine, unspecified: Secondary | ICD-10-CM

## 2015-07-21 DIAGNOSIS — R319 Hematuria, unspecified: Secondary | ICD-10-CM | POA: Diagnosis not present

## 2015-07-21 DIAGNOSIS — E785 Hyperlipidemia, unspecified: Secondary | ICD-10-CM

## 2015-07-21 DIAGNOSIS — Z90410 Acquired total absence of pancreas: Secondary | ICD-10-CM

## 2015-07-21 DIAGNOSIS — E891 Postprocedural hypoinsulinemia: Secondary | ICD-10-CM

## 2015-07-21 DIAGNOSIS — E139 Other specified diabetes mellitus without complications: Secondary | ICD-10-CM

## 2015-07-21 LAB — POCT URINALYSIS DIPSTICK
BILIRUBIN UA: NEGATIVE
Ketones, UA: NEGATIVE
Leukocytes, UA: NEGATIVE
Nitrite, UA: NEGATIVE
Spec Grav, UA: 1.02
Urobilinogen, UA: 4
pH, UA: 5.5

## 2015-07-21 LAB — HEPATIC FUNCTION PANEL
ALBUMIN: 4.2 g/dL (ref 3.5–5.2)
ALT: 8 U/L (ref 0–35)
AST: 11 U/L (ref 0–37)
Alkaline Phosphatase: 76 U/L (ref 39–117)
Bilirubin, Direct: 0.1 mg/dL (ref 0.0–0.3)
TOTAL PROTEIN: 7.5 g/dL (ref 6.0–8.3)
Total Bilirubin: 0.7 mg/dL (ref 0.2–1.2)

## 2015-07-21 LAB — TSH: TSH: 1.28 u[IU]/mL (ref 0.35–4.50)

## 2015-07-21 LAB — BASIC METABOLIC PANEL
BUN: 11 mg/dL (ref 6–23)
CALCIUM: 9.6 mg/dL (ref 8.4–10.5)
CO2: 27 meq/L (ref 19–32)
CREATININE: 0.82 mg/dL (ref 0.40–1.20)
Chloride: 98 mEq/L (ref 96–112)
GFR: 98 mL/min (ref >60.00)
GLUCOSE: 413 mg/dL — AB (ref 70–99)
Potassium: 3.9 mEq/L (ref 3.5–5.1)
SODIUM: 132 meq/L — AB (ref 135–145)

## 2015-07-21 LAB — LIPID PANEL
CHOLESTEROL: 235 mg/dL — AB (ref 0–200)
HDL: 49.6 mg/dL (ref >39.00)
LDL CALC: 173 mg/dL — AB (ref 0–99)
NonHDL: 185.75
TRIGLYCERIDES: 65 mg/dL (ref 0.0–149.0)
Total CHOL/HDL Ratio: 5
VLDL: 13 mg/dL (ref 0.0–40.0)

## 2015-07-21 LAB — HEMOGLOBIN A1C: HEMOGLOBIN A1C: 12.9 % — AB (ref 4.6–6.5)

## 2015-07-21 MED ORDER — ROSUVASTATIN CALCIUM 20 MG PO TABS
20.0000 mg | ORAL_TABLET | Freq: Every day | ORAL | Status: DC
Start: 2015-07-21 — End: 2015-09-25

## 2015-07-21 NOTE — Progress Notes (Signed)
Pre visit review using our clinic review tool, if applicable. No additional management support is needed unless otherwise documented below in the visit note. 

## 2015-07-21 NOTE — Assessment & Plan Note (Signed)
Chronic problem.  Pt has not been taking insulin or following w/ Endo in over 1 year.  Stressed need for management of her diabetes as this is likely contributing to her urinary sxs.  Check labs.  Refer to Endo.  Will follow.

## 2015-07-21 NOTE — Patient Instructions (Signed)
Schedule your complete physical We'll notify you of your lab results and make any changes if needed We'll call you with your endocrinology appt for diabetes management We are sending your urine for culture and will treat as needed Drink plenty of fluids Call with any questions or concerns Hang in there!!!

## 2015-07-21 NOTE — Assessment & Plan Note (Signed)
New.  Pt's UA is not overwhelming for infxn and I suspect her recurrent incomplete bladder emptying is due to her uncontrolled DM and possible neurogenic bladder.  Will send urine for cx prior to starting abx.  Reviewed supportive care and red flags that should prompt return.  Pt expressed understanding and is in agreement w/ plan.

## 2015-07-21 NOTE — Progress Notes (Signed)
   Subjective:    Patient ID: Shannon Caldwell, female    DOB: 1970/09/08, 45 y.o.   MRN: [id number]  HPI UTI- 'it's been going on for awhile'.  Has sensation of incomplete bladder emptying w/ foul 'ammonia' smell.  No abd pain but 'it's uncomfortable'.  Mild dysuria.  + frequency.  DM- chronic problem, not currently on insulin b/c she lost her insurance.  On Metformin but this is causing pt to be sick.   Review of Systems For ROS see HPI     Objective:   Physical Exam  Constitutional: She is oriented to person, place, and time. She appears well-developed and well-nourished. No distress.  HENT:  Head: Normocephalic and atraumatic.  Eyes: Conjunctivae and EOM are normal. Pupils are equal, round, and reactive to light.  Neck: Normal range of motion. Neck supple. No thyromegaly present.  Cardiovascular: Normal rate, regular rhythm, normal heart sounds and intact distal pulses.   No murmur heard. Pulmonary/Chest: Effort normal and breath sounds normal. No respiratory distress.  Abdominal: Soft. She exhibits no distension. There is tenderness (mild suprapubic TTP).  Musculoskeletal: She exhibits no edema.  Lymphadenopathy:    She has no cervical adenopathy.  Neurological: She is alert and oriented to person, place, and time.  Skin: Skin is warm and dry.  Psychiatric: She has a normal mood and affect. Her behavior is normal.  Vitals reviewed.         Assessment & Plan:

## 2015-07-23 ENCOUNTER — Other Ambulatory Visit: Payer: Self-pay | Admitting: General Practice

## 2015-07-23 LAB — URINE CULTURE: Colony Count: >=100000

## 2015-07-23 MED ORDER — CEPHALEXIN 500 MG PO CAPS: 500.0000 mg | ORAL_CAPSULE | Freq: Two times a day (BID) | ORAL | Status: AC

## 2015-07-24 ENCOUNTER — Telehealth: Payer: Self-pay | Admitting: Family Medicine

## 2015-07-24 NOTE — Telephone Encounter (Signed)
Letter printed for signature

## 2015-07-24 NOTE — Telephone Encounter (Signed)
Caller name: Self  Can be reached: [phone]   Reason for call: Patient called stating that she saw Dr. Birdie Riddle on 2/14 and was dx with a UTI. Requesting a note for being out of work since Tuesday and returning on Monday 2/20.

## 2015-07-24 NOTE — Telephone Encounter (Signed)
I can cover Tuesday through Friday (which is very generous for UTI)

## 2015-08-12 ENCOUNTER — Encounter: Payer: Self-pay | Admitting: Internal Medicine

## 2015-09-17 ENCOUNTER — Encounter: Payer: Managed Care, Other (non HMO) | Admitting: Family Medicine

## 2015-09-21 ENCOUNTER — Encounter: Payer: Managed Care, Other (non HMO) | Admitting: Family Medicine

## 2015-09-25 ENCOUNTER — Other Ambulatory Visit (HOSPITAL_COMMUNITY)
Admission: RE | Admit: 2015-09-25 | Discharge: 2015-09-25 | Disposition: A | Discharge status: Home / Self Care | Payer: Managed Care, Other (non HMO) | Source: Ambulatory Visit | Attending: Family Medicine | Admitting: Family Medicine

## 2015-09-25 ENCOUNTER — Ambulatory Visit (INDEPENDENT_AMBULATORY_CARE_PROVIDER_SITE_OTHER): Payer: Managed Care, Other (non HMO) | Admitting: Family Medicine

## 2015-09-25 ENCOUNTER — Encounter: Payer: Self-pay | Admitting: Family Medicine

## 2015-09-25 VITALS — BP 160/92 | HR 95 | Temp 98.0°F | Resp 16 | Ht 66.0 in | Wt 166.4 lb

## 2015-09-25 DIAGNOSIS — I10 Essential (primary) hypertension: Secondary | ICD-10-CM | POA: Diagnosis not present

## 2015-09-25 DIAGNOSIS — E891 Postprocedural hypoinsulinemia: Secondary | ICD-10-CM

## 2015-09-25 DIAGNOSIS — N63 Unspecified lump in unspecified breast: Secondary | ICD-10-CM | POA: Insufficient documentation

## 2015-09-25 DIAGNOSIS — E785 Hyperlipidemia, unspecified: Secondary | ICD-10-CM | POA: Diagnosis not present

## 2015-09-25 DIAGNOSIS — N760 Acute vaginitis: Secondary | ICD-10-CM | POA: Insufficient documentation

## 2015-09-25 DIAGNOSIS — R300 Dysuria: Secondary | ICD-10-CM

## 2015-09-25 DIAGNOSIS — Z0000 Encounter for general adult medical examination without abnormal findings: Secondary | ICD-10-CM | POA: Diagnosis not present

## 2015-09-25 DIAGNOSIS — Z90410 Acquired total absence of pancreas: Secondary | ICD-10-CM

## 2015-09-25 DIAGNOSIS — E139 Other specified diabetes mellitus without complications: Secondary | ICD-10-CM

## 2015-09-25 LAB — POCT URINALYSIS DIPSTICK
Bilirubin, UA: NEGATIVE
LEUKOCYTES UA: NEGATIVE
Nitrite, UA: NEGATIVE
SPEC GRAV UA: 1.01
Urobilinogen, UA: 0.2
pH, UA: 6

## 2015-09-25 MED ORDER — INSULIN DETEMIR 100 UNIT/ML ~~LOC~~ SOLN: 25.0000 [IU] | Freq: Two times a day (BID) | SUBCUTANEOUS | Status: DC

## 2015-09-25 MED ORDER — LOSARTAN POTASSIUM 50 MG PO TABS: 50.0000 mg | ORAL_TABLET | Freq: Every day | ORAL | Status: DC

## 2015-09-25 MED ORDER — INSULIN LISPRO 100 UNIT/ML (KWIKPEN): PEN_INJECTOR | SUBCUTANEOUS | Status: DC

## 2015-09-25 MED ORDER — ROSUVASTATIN CALCIUM 20 MG PO TABS: 20.0000 mg | ORAL_TABLET | Freq: Every day | ORAL | Status: DC

## 2015-09-25 NOTE — Patient Instructions (Addendum)
Follow up in 3-4 weeks to recheck BP START the Losartan daily for BP START the Crestor nightly for cholesterol RESTART the insulin as directed We'll call you with your urine results Please call Dr Cruzita Lederer at [phone] We'll call you with your mammogram appt Call with any questions or concerns Hang in there!!

## 2015-09-25 NOTE — Progress Notes (Signed)
Pre visit review using our clinic review tool, if applicable. No additional management support is needed unless otherwise documented below in the visit note. 

## 2015-09-25 NOTE — Progress Notes (Signed)
   Subjective:    Patient ID: Shannon Caldwell, female    DOB: 02/23/1971, 45 y.o.   MRN: [id number]  HPI CPE- due for pap (pt would like to return for this), due for mammo.  Has not seen Endo.  Has not started Crestor.  Needs to restart Insulin.  BP is elevated today.  Pt reports she has worked 28 days straight- not sleeping.  States she has very high stress level.   Review of Systems Patient reports no hearing changes, adenopathy,fever, weight change,  persistant/recurrent hoarseness , swallowing issues, chest pain, palpitations, edema, persistant/recurrent cough, hemoptysis, dyspnea (rest/exertional/paroxysmal nocturnal), gastrointestinal bleeding (melena, rectal bleeding), abdominal pain, significant heartburn, bowel changes, Gyn symptoms (abnormal  bleeding, pain),  syncope, focal weakness, memory loss, numbness & tingling, skin/nail changes, abnormal bruising or bleeding, anxiety, or depression.   + breast lumps bilaterally w/ nipple itching + blurry vision + polyuria + patchy alopecia    Objective:   Physical Exam  General Appearance:    Alert, cooperative, no distress, appears stated age  Head:    Normocephalic, without obvious abnormality, atraumatic  Eyes:    PERRL, conjunctiva/corneas clear, EOM's intact, fundi    benign, both eyes  Ears:    Normal TM's and external ear canals, both ears  Nose:   Nares normal, septum midline, mucosa normal, no drainage    or sinus tenderness  Throat:   Lips, mucosa, and tongue normal; teeth and gums normal  Neck:   Supple, symmetrical, trachea midline, no adenopathy;    Thyroid: no enlargement/tenderness/nodules  Back:     Symmetric, no curvature, ROM normal, no CVA tenderness  Lungs:     Clear to auscultation bilaterally, respirations unlabored  Chest Wall:    No tenderness or deformity   Heart:    Regular rate and rhythm, S1 and S2 normal, no murmur, rub   or gallop  Breast Exam:    Scattered small, mildly TTP breast lumps bilaterally    Abdomen:     Soft, non-tender, bowel sounds active all four quadrants,    no masses, no organomegaly  Genitalia:    Deferred at pt's request  Rectal:    Extremities:   Extremities normal, atraumatic, no cyanosis or edema  Pulses:   2+ and symmetric all extremities  Skin:   Skin color, texture, turgor normal, no rashes or lesions  Lymph nodes:   Cervical, supraclavicular, and axillary nodes normal  Neurologic:   CNII-XII intact, normal strength, sensation and reflexes    throughout          Assessment & Plan:

## 2015-09-27 NOTE — Assessment & Plan Note (Signed)
Reviewed recent labs w/ pt and stressed need for her to start the Crestor that was sent to her pharmacy at last visit.  Will follow.

## 2015-09-27 NOTE — Assessment & Plan Note (Signed)
New.  Refer for diagnostic mammo

## 2015-09-27 NOTE — Assessment & Plan Note (Signed)
Deteriorated.  BP is not controlled today.  Will start Losartan for both BP and renal protection in setting of diabetes.  Pt will follow up in 3-4 weeks to recheck BMP and BP.  Pt expressed understanding and is in agreement w/ plan.

## 2015-09-27 NOTE — Assessment & Plan Note (Signed)
Pt's PE WNL w/ exception of scattered breast masses that seems consistent w/ fibrocystic change but must be evaluated.  Diagnostic mammo ordered.  Will do pap at next visit per pt request.  Reviewed recent labs.  Stressed need for compliance with her diabetes and cholesterol treatment.  Will follow.

## 2015-09-27 NOTE — Assessment & Plan Note (Signed)
New.  Check UA.  Await culture results and tx prn.

## 2015-09-27 NOTE — Assessment & Plan Note (Signed)
Chronic problem.  Not compliant w/ meds or follow up.  Stressed need for both.  Will restart insulin today while she calls and schedules an appt w/ Endo.  Start ARB for both renal protection and BP control.  I hope pt will take her diabetes more seriously after our discussion today.  Will follow.

## 2015-10-01 ENCOUNTER — Telehealth: Payer: Self-pay | Admitting: Family Medicine

## 2015-10-01 ENCOUNTER — Other Ambulatory Visit: Payer: Self-pay | Admitting: General Practice

## 2015-10-01 ENCOUNTER — Other Ambulatory Visit: Payer: Self-pay | Admitting: Family Medicine

## 2015-10-01 DIAGNOSIS — N63 Unspecified lump in unspecified breast: Secondary | ICD-10-CM

## 2015-10-01 LAB — URINE CYTOLOGY ANCILLARY ONLY
Bacterial vaginitis: POSITIVE — AB
Candida vaginitis: NEGATIVE

## 2015-10-01 MED ORDER — METRONIDAZOLE 500 MG PO TABS: 500.0000 mg | ORAL_TABLET | Freq: Two times a day (BID) | ORAL | Status: DC

## 2015-10-01 NOTE — Telephone Encounter (Signed)
Pt called and informed of results.

## 2015-10-01 NOTE — Telephone Encounter (Signed)
Pt calling for results on urine. Pt can be reached at cell or ([phone])

## 2015-10-06 ENCOUNTER — Telehealth: Payer: Self-pay | Admitting: Family Medicine

## 2015-10-06 NOTE — Telephone Encounter (Signed)
Pt states that she has another uti and asking if something can be called in for her, pt did make an appt for tomorrow, [facility] [address]

## 2015-10-06 NOTE — Telephone Encounter (Signed)
Can take OTC AZO until appt tomorrow but given her diabetes, we need to make sure we are treating appropriately

## 2015-10-06 NOTE — Telephone Encounter (Signed)
Called pt and informed of PCP recommendations. Pt stated an Understanding and agreement to advice.

## 2015-10-07 ENCOUNTER — Ambulatory Visit (INDEPENDENT_AMBULATORY_CARE_PROVIDER_SITE_OTHER): Payer: Managed Care, Other (non HMO) | Admitting: Family Medicine

## 2015-10-07 ENCOUNTER — Encounter: Payer: Self-pay | Admitting: Family Medicine

## 2015-10-07 VITALS — BP 140/82 | HR 72 | Temp 98.1°F | Resp 16 | Ht 66.0 in | Wt 168.5 lb

## 2015-10-07 DIAGNOSIS — R8290 Unspecified abnormal findings in urine: Secondary | ICD-10-CM | POA: Diagnosis not present

## 2015-10-07 DIAGNOSIS — R300 Dysuria: Secondary | ICD-10-CM | POA: Diagnosis not present

## 2015-10-07 DIAGNOSIS — R319 Hematuria, unspecified: Secondary | ICD-10-CM

## 2015-10-07 DIAGNOSIS — N390 Urinary tract infection, site not specified: Secondary | ICD-10-CM | POA: Diagnosis not present

## 2015-10-07 DIAGNOSIS — R82998 Other abnormal findings in urine: Secondary | ICD-10-CM

## 2015-10-07 LAB — POCT URINALYSIS DIPSTICK
BILIRUBIN UA: NEGATIVE
KETONES UA: NEGATIVE
LEUKOCYTES UA: NEGATIVE
PH UA: 6.5
Protein, UA: NEGATIVE
Spec Grav, UA: <=1.005
Urobilinogen, UA: 0.2

## 2015-10-07 MED ORDER — HYDROCHLOROTHIAZIDE 12.5 MG PO TABS: 12.5000 mg | ORAL_TABLET | Freq: Every day | ORAL | Status: DC

## 2015-10-07 MED ORDER — CIPROFLOXACIN HCL 500 MG PO TABS: 500.0000 mg | ORAL_TABLET | Freq: Two times a day (BID) | ORAL | Status: DC

## 2015-10-07 NOTE — Patient Instructions (Signed)
Follow up as needed Start the Cipro twice daily Drink plenty of fluids Call with any questions or concerns Hang in there!!!

## 2015-10-07 NOTE — Progress Notes (Signed)
   Subjective:    Patient ID: Shannon Caldwell, female    DOB: December 08, 1970, 45 y.o.   MRN: [id number]  HPI UTI- pt reports pain for last 3 days but has been in bed for last 2 w/ R back pain.  Denies burning w/ urination but having dark, foul smelling urine.  Decreased urination.  Decreased appetite.  No fevers.  + suprapubic tenderness.   Review of Systems For ROS see HPI     Objective:   Physical Exam  Constitutional: She is oriented to person, place, and time. She appears well-developed and well-nourished. No distress.  HENT:  Head: Normocephalic and atraumatic.  Abdominal: Soft. She exhibits no distension. There is tenderness (+ suprapubic but no CVA tenderness ).  Neurological: She is alert and oriented to person, place, and time.  Skin: Skin is warm and dry.  Psychiatric: She has a normal mood and affect. Her behavior is normal. Thought content normal.  Vitals reviewed.         Assessment & Plan:

## 2015-10-07 NOTE — Progress Notes (Signed)
Pre visit review using our clinic review tool, if applicable. No additional management support is needed unless otherwise documented below in the visit note. 

## 2015-10-07 NOTE — Assessment & Plan Note (Signed)
Recurring problem for pt.  Now w/ suprapubic tenderness and LBP.  No CVA tenderness on exam.  UA consistent w/ infxn.  Start abx.  Reviewed supportive care and red flags that should prompt return.  Pt expressed understanding and is in agreement w/ plan.

## 2015-10-09 ENCOUNTER — Ambulatory Visit
Admission: RE | Admit: 2015-10-09 | Discharge: 2015-10-09 | Disposition: A | Discharge status: Home / Self Care | Payer: Managed Care, Other (non HMO) | Source: Ambulatory Visit | Attending: Family Medicine | Admitting: Family Medicine

## 2015-10-09 DIAGNOSIS — N63 Unspecified lump in unspecified breast: Secondary | ICD-10-CM

## 2015-10-09 LAB — URINE CULTURE: Colony Count: >=100000

## 2015-10-11 ENCOUNTER — Other Ambulatory Visit: Payer: Self-pay | Admitting: Family Medicine

## 2015-10-11 MED ORDER — CEPHALEXIN 500 MG PO CAPS
500.0000 mg | ORAL_CAPSULE | Freq: Two times a day (BID) | ORAL | Status: AC
Start: 2015-10-11 — End: 2015-10-21

## 2015-10-12 ENCOUNTER — Telehealth: Payer: Self-pay

## 2015-10-12 ENCOUNTER — Other Ambulatory Visit: Payer: Self-pay | Admitting: General Practice

## 2015-10-12 MED ORDER — FLUCONAZOLE 150 MG PO TABS: 150.0000 mg | ORAL_TABLET | Freq: Once | ORAL | Status: DC

## 2015-10-12 NOTE — Telephone Encounter (Signed)
[place] for [employer]

## 2015-10-12 NOTE — Telephone Encounter (Signed)
Patient called regarding the UTI results, patient is requesting Diflucan to be sent to pharmacy, she did complete the Cipro and will pick up the Keflex.

## 2015-10-12 NOTE — Telephone Encounter (Signed)
Medication filled to pharmacy as requested.   

## 2015-10-16 ENCOUNTER — Other Ambulatory Visit (HOSPITAL_COMMUNITY)
Admission: RE | Admit: 2015-10-16 | Discharge: 2015-10-16 | Disposition: A | Discharge status: Home / Self Care | Payer: Managed Care, Other (non HMO) | Source: Ambulatory Visit | Attending: Family Medicine | Admitting: Family Medicine

## 2015-10-16 ENCOUNTER — Ambulatory Visit (INDEPENDENT_AMBULATORY_CARE_PROVIDER_SITE_OTHER): Payer: Managed Care, Other (non HMO) | Admitting: Family Medicine

## 2015-10-16 ENCOUNTER — Encounter: Payer: Self-pay | Admitting: Family Medicine

## 2015-10-16 VITALS — BP 150/90 | HR 100 | Temp 98.0°F | Resp 16 | Ht 66.0 in | Wt 167.5 lb

## 2015-10-16 DIAGNOSIS — Z01419 Encounter for gynecological examination (general) (routine) without abnormal findings: Secondary | ICD-10-CM | POA: Insufficient documentation

## 2015-10-16 DIAGNOSIS — Z1151 Encounter for screening for human papillomavirus (HPV): Secondary | ICD-10-CM | POA: Diagnosis not present

## 2015-10-16 DIAGNOSIS — Z124 Encounter for screening for malignant neoplasm of cervix: Secondary | ICD-10-CM

## 2015-10-16 DIAGNOSIS — I10 Essential (primary) hypertension: Secondary | ICD-10-CM | POA: Diagnosis not present

## 2015-10-16 NOTE — Progress Notes (Signed)
Pre visit review using our clinic review tool, if applicable. No additional management support is needed unless otherwise documented below in the visit note. 

## 2015-10-16 NOTE — Progress Notes (Signed)
   Subjective:    Patient ID: Shannon Caldwell, female    DOB: 1971/04/02, 45 y.o.   MRN: [id number]  HPI HTN- chronic problem, was started on Losartan at last visit but there is some confusion as to whether pharmacy dispensed Losartan or Lipitor.  BP is higher today.  No CP, SOB, HAs, visual changes, edema.  Pap- pt is due.  No vaginal concerns- no d/c, pain, abnormal bleeding, concerns for STDs.   Review of Systems For ROS see HPI     Objective:   Physical Exam  Constitutional: She is oriented to person, place, and time. She appears well-developed and well-nourished. No distress.  HENT:  Head: Normocephalic and atraumatic.  Cardiovascular: Normal rate, regular rhythm and normal heart sounds.   Pulmonary/Chest: Effort normal and breath sounds normal. No respiratory distress. She has no wheezes. She has no rales.  Genitourinary: Rectal exam shows no external hemorrhoid and no mass. There is no rash, tenderness, lesion or injury on the right labia. There is no rash, tenderness, lesion or injury on the left labia. Uterus is not deviated, not enlarged, not fixed and not tender. Cervix exhibits no motion tenderness, no discharge and no friability. Right adnexum displays no mass, no tenderness and no fullness. Left adnexum displays no mass, no tenderness and no fullness. No erythema, tenderness or bleeding in the vagina. No foreign body around the vagina. No signs of injury around the vagina. No vaginal discharge found.  Neurological: She is alert and oriented to person, place, and time.  Skin: Skin is warm and dry.  Psychiatric: She has a normal mood and affect. Her behavior is normal. Thought content normal.  Vitals reviewed.         Assessment & Plan:

## 2015-10-16 NOTE — Patient Instructions (Signed)
We'll notify you of your pap results once they are available Please check your meds at home and see if you are taking the Losartan for blood pressure- that way we can make changes if needed Call with any questions or concerns Happy Mother's Day!!!

## 2015-10-18 NOTE — Assessment & Plan Note (Signed)
Deteriorated.  Pt was supposed to start Losartan at last visit but she feels she was given lipitor at the pharmacy by mistake.  She is to go home and check her medication and let me know what she is taking so I can make appropriate adjustments.  Pt expressed understanding and is in agreement w/ plan.

## 2015-10-18 NOTE — Assessment & Plan Note (Signed)
Pap collected. 

## 2015-10-21 ENCOUNTER — Encounter: Payer: Self-pay | Admitting: General Practice

## 2015-10-21 LAB — CYTOLOGY - PAP

## 2015-11-05 LAB — HM DIABETES EYE EXAM

## 2015-11-12 ENCOUNTER — Telehealth: Payer: Self-pay | Admitting: Family Medicine

## 2015-11-12 NOTE — Telephone Encounter (Signed)
Pt asking for another antibiotic to treat her BV, pt states that it has not gone away and also asking for another refill on diflucan.

## 2015-11-17 ENCOUNTER — Ambulatory Visit: Payer: Managed Care, Other (non HMO) | Admitting: Family Medicine

## 2015-11-17 DIAGNOSIS — Z0289 Encounter for other administrative examinations: Secondary | ICD-10-CM

## 2015-11-17 NOTE — Telephone Encounter (Signed)
Pt states she went to pharmacy to pick up rx but it was not there,  I informed pt medication had not been sent to pharmacy.  Pt inquiring if medication will be sent in to pharmacy.  Pharmacy: [id number] [address] [place] [address] [place]

## 2015-11-17 NOTE — Telephone Encounter (Signed)
Patient notified of PCP recommendations and is agreement and expresses an understanding.  

## 2015-11-17 NOTE — Telephone Encounter (Signed)
Pt was told that she needs appt prior to medication being sent to ensure we are treating the right thing.  Sending 2 different medications w/o confirmation of infection is not a good idea

## 2015-11-18 ENCOUNTER — Telehealth: Payer: Self-pay | Admitting: Family Medicine

## 2015-11-18 NOTE — Telephone Encounter (Signed)
Pt was no show 11/17/15 for acute appt, pt has rescheduled for 11/20/15, charge or no charge?

## 2015-11-18 NOTE — Telephone Encounter (Signed)
Yes- she needs a charge.

## 2015-11-20 ENCOUNTER — Encounter: Payer: Self-pay | Admitting: Family Medicine

## 2015-11-20 ENCOUNTER — Encounter: Payer: Self-pay | Admitting: General Practice

## 2015-11-20 ENCOUNTER — Ambulatory Visit: Payer: Managed Care, Other (non HMO) | Admitting: Family Medicine

## 2015-11-20 DIAGNOSIS — Z0289 Encounter for other administrative examinations: Secondary | ICD-10-CM

## 2015-11-20 NOTE — Telephone Encounter (Signed)
Marked to charge and mailing no show letter °

## 2015-11-23 ENCOUNTER — Telehealth: Payer: Self-pay | Admitting: Family Medicine

## 2015-11-23 NOTE — Telephone Encounter (Signed)
Charge, pt is being dismissed from practice. She has actually missed 3 including a CPE.

## 2015-11-23 NOTE — Telephone Encounter (Signed)
Patient was No Show on 6/13. 2nd Now Show within a year. Charge or No Charge?

## 2015-11-24 ENCOUNTER — Encounter: Payer: Self-pay | Admitting: Family Medicine

## 2015-12-02 ENCOUNTER — Encounter: Payer: Self-pay | Admitting: Internal Medicine

## 2015-12-02 ENCOUNTER — Ambulatory Visit (INDEPENDENT_AMBULATORY_CARE_PROVIDER_SITE_OTHER): Payer: Managed Care, Other (non HMO) | Admitting: Internal Medicine

## 2015-12-02 VITALS — BP 130/80 | HR 89 | Ht 66.0 in | Wt 165.0 lb

## 2015-12-02 DIAGNOSIS — E891 Postprocedural hypoinsulinemia: Secondary | ICD-10-CM

## 2015-12-02 DIAGNOSIS — Z90410 Acquired total absence of pancreas: Secondary | ICD-10-CM | POA: Diagnosis not present

## 2015-12-02 DIAGNOSIS — E139 Other specified diabetes mellitus without complications: Secondary | ICD-10-CM

## 2015-12-02 LAB — POCT GLYCOSYLATED HEMOGLOBIN (HGB A1C): HEMOGLOBIN A1C: 12.1

## 2015-12-02 MED ORDER — INSULIN DETEMIR 100 UNIT/ML ~~LOC~~ SOLN: 20.0000 [IU] | Freq: Every day | SUBCUTANEOUS | Status: DC

## 2015-12-02 NOTE — Patient Instructions (Signed)
Please start Levemir at bedtime: 20 units for 3 days, then increase by 4 units every 4 days until <150 in am.  Please continue Humalog: - 10 units for a smaller meal - 13 units for a larger meal or if you have dessert  Please return in 1.5 months with your sugar log.

## 2015-12-02 NOTE — Addendum Note (Signed)
Addended by: Caprice Beaver T on: 12/02/2015 09:22 AM   Modules accepted: Orders

## 2015-12-02 NOTE — Progress Notes (Signed)
Patient ID: Shannon Caldwell, female   DOB: 1970/11/05, 45 y.o.   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 Last visit 1 year and 7 mo ago! She lost her insurance in last year.  She is having frequent UTIs.  Last hemoglobin A1c was: Lab Results  Component Value Date   HGBA1C 12.9* 07/21/2015   HGBA1C 13.5* 04/18/2014   HGBA1C 12.5* 01/16/2014   She is now on: She stopped Levemir 25 units 2x a day - no insurance.  Now only on Humalog instead of Novolog - 2x a day: 7 units with a small meal (start with this for a smoothie or a salad) 9 units with a larger meal 11 units with a large meal (if you are having dessert or eating out)  Pt checks her sugars 2x a day: - am: 190-200 >> 160s >> 190s - 2h after b'fast: n/c - before lunch: n/c - 2h after lunch: n/c - before dinner: 200-290 >> 195 >> 280-300 - 2h after dinner: n/c  - bedtime: n/c - nighttime: n/c No lows. Lowest sugar was 90 >> 165; she has hypoglycemia awareness at 100.  Highest sugar was 320 >> 300.  Pt's meals are: - Breakfast: English muffin, grapefruit, egg - no insulin with this - Lunch: bagel sandwich - Dinner: chicken breast + veggies - no starches - Snacks:dried cherries and almonds  Less sweets, no sweet drinks.  Has a new job 8 am -6 pm - after work: cardio, jogging, soft  - no CKD, last BUN/creatinine:  Lab Results  Component Value Date   BUN 11 07/21/2015   CREATININE 0.82 07/21/2015   - last set of lipids: Lab Results  Component Value Date   CHOL 235* 07/21/2015   HDL 49.60 07/21/2015   LDLCALC 173* 07/21/2015   TRIG 65.0 07/21/2015   CHOLHDL 5 07/21/2015  She is on Crestor 20. - last eye exam was in 09/2015. No DR. - no numbness and tingling in her feet.  She has a h/o low TSH >> resolved: Lab Results  Component  Value Date   TSH 1.28 07/21/2015   TSH 1.01 04/18/2014   TSH 0.24* 01/16/2014   TSH 1.603 08/27/2011   FREET4 1.01 04/18/2014   ROS: Constitutional: no weight gain/loss, + fatigue, + excessive urination + nocturia Eyes: + occasional blurry vision, no xerophthalmia ENT: no sore throat, no nodules palpated in throat, no dysphagia/odynophagia, no hoarseness, + tinnitus Cardiovascular: no CP/SOB/palpitations/+ leg swelling Respiratory: no cough/SOB Gastrointestinal:+ heartburn. No diarrhea and constipation Musculoskeletal: no muscle/joint aches Skin: no rashes, + rash, + hair loss Neurological: no tremors/numbness/tingling/dizziness, + HA  PE: BP 130/80 mmHg  Pulse 89  Ht 5\' 6"  (1.676 m)  Wt 165 lb (74.844 kg)  BMI 26.64 kg/m2  SpO2 96%  LMP 11/21/2015 Wt Readings from Last 3 Encounters:  12/02/15 165 lb (74.844 kg)  10/16/15 167 lb 8 oz (75.978 kg)  10/07/15 168 lb 8 oz (76.431 kg)   Constitutional: slightly overweight, in NAD Eyes: PERRLA, EOMI, no exophthalmos ENT: moist mucous membranes, no thyromegaly, no cervical lymphadenopathy Cardiovascular: RRR, No MRG Respiratory: CTA B Gastrointestinal: abdomen soft, NT, ND, BS+ Musculoskeletal: no deformities, strength intact in all 4 Skin: moist, warm, no rashes; + alopecia areata vertex Neurological: no tremor with outstretched hands, DTR normal in all 4  ASSESSMENT: 1. 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 Patient with long-standing, uncontrolled diabetes, on basal-bolus insulin regimen, returning after a long absence. She is not taking basal insulin... She is only taking mealtime insulin with 2 instead of 3 meals a day. Sugars are very high, with a staircase pattern of increase throughout the day. - Will add basal insulin and increase mealtime insulin a little. Advised her to also take it with b'fast. - for now,I suggested to:  Patient Instructions  Please start  Levemir at bedtime: 20 units for 3 days, then increase by 4 units every 4 days until <150 in am.  Please continue Humalog: - 10 units for a smaller meal - 13 units for a larger meal or if you have dessert  Please return in 1.5 months with your sugar log.   - continue to check sugars - check 3 times a day, rotating checks - advised for yearly eye exams >> UTD - checked hemoglobin A1c today >> 12.1% (terrible) - Return to clinic in 1.5 mo with sugar log

## 2015-12-30 ENCOUNTER — Telehealth: Payer: Self-pay | Admitting: *Deleted

## 2015-12-30 NOTE — Telephone Encounter (Signed)
Please route this message to Dr. Arman Filter CMA. Thanks.

## 2015-12-30 NOTE — Telephone Encounter (Signed)
I am working on PA for pts Levemir FlexTouch. I spoke to tech Willey Blade) at [place] he stated that pts insurance will cover [place] with a copay of $15. Please advise if it is okay to change to [employer]. Thanks.

## 2015-12-30 NOTE — Telephone Encounter (Signed)
Yes, let's send Basaglar same dose. Thank you!

## 2015-12-30 NOTE — Telephone Encounter (Signed)
Please see below.

## 2016-01-01 ENCOUNTER — Other Ambulatory Visit: Payer: Self-pay

## 2016-01-01 MED ORDER — BASAGLAR KWIKPEN 100 UNIT/ML ~~LOC~~ SOPN: PEN_INJECTOR | SUBCUTANEOUS | 0 refills | Status: DC

## 2016-01-03 ENCOUNTER — Encounter (HOSPITAL_COMMUNITY): Payer: Self-pay

## 2016-01-03 ENCOUNTER — Inpatient Hospital Stay (HOSPITAL_COMMUNITY)
Admission: AD | Admit: 2016-01-03 | Discharge: 2016-01-03 | Disposition: A | Discharge status: Home / Self Care | Payer: Managed Care, Other (non HMO) | Source: Ambulatory Visit | Attending: Family Medicine | Admitting: Family Medicine

## 2016-01-03 ENCOUNTER — Inpatient Hospital Stay (HOSPITAL_COMMUNITY): Payer: Managed Care, Other (non HMO)

## 2016-01-03 DIAGNOSIS — Z794 Long term (current) use of insulin: Secondary | ICD-10-CM | POA: Diagnosis not present

## 2016-01-03 DIAGNOSIS — Z79899 Other long term (current) drug therapy: Secondary | ICD-10-CM | POA: Insufficient documentation

## 2016-01-03 DIAGNOSIS — I10 Essential (primary) hypertension: Secondary | ICD-10-CM

## 2016-01-03 DIAGNOSIS — E785 Hyperlipidemia, unspecified: Secondary | ICD-10-CM | POA: Insufficient documentation

## 2016-01-03 DIAGNOSIS — N949 Unspecified condition associated with female genital organs and menstrual cycle: Secondary | ICD-10-CM | POA: Diagnosis not present

## 2016-01-03 DIAGNOSIS — Z885 Allergy status to narcotic agent status: Secondary | ICD-10-CM | POA: Diagnosis not present

## 2016-01-03 DIAGNOSIS — R109 Unspecified abdominal pain: Secondary | ICD-10-CM

## 2016-01-03 DIAGNOSIS — K219 Gastro-esophageal reflux disease without esophagitis: Secondary | ICD-10-CM | POA: Diagnosis not present

## 2016-01-03 DIAGNOSIS — E0800 Diabetes mellitus due to underlying condition with hyperosmolarity without nonketotic hyperglycemic-hyperosmolar coma (NKHHC): Secondary | ICD-10-CM

## 2016-01-03 DIAGNOSIS — R1031 Right lower quadrant pain: Secondary | ICD-10-CM | POA: Diagnosis present

## 2016-01-03 DIAGNOSIS — R102 Pelvic and perineal pain unspecified side: Secondary | ICD-10-CM

## 2016-01-03 DIAGNOSIS — O9989 Other specified diseases and conditions complicating pregnancy, childbirth and the puerperium: Secondary | ICD-10-CM

## 2016-01-03 DIAGNOSIS — E1165 Type 2 diabetes mellitus with hyperglycemia: Secondary | ICD-10-CM | POA: Insufficient documentation

## 2016-01-03 LAB — CBC WITH DIFFERENTIAL/PLATELET
BASOS ABS: 0 10*3/uL (ref 0.0–0.1)
BASOS PCT: 0 %
EOS ABS: 0 10*3/uL (ref 0.0–0.7)
EOS PCT: 0 %
HCT: 38 % (ref 36.0–46.0)
Hemoglobin: 13 g/dL (ref 12.0–15.0)
Lymphocytes Relative: 34 %
Lymphs Abs: 2.3 10*3/uL (ref 0.7–4.0)
MCH: 28.5 pg (ref 26.0–34.0)
MCHC: 34.2 g/dL (ref 30.0–36.0)
MCV: 83.3 fL (ref 78.0–100.0)
MONO ABS: 0.3 10*3/uL (ref 0.1–1.0)
MONOS PCT: 4 %
Neutro Abs: 4.1 10*3/uL (ref 1.7–7.7)
Neutrophils Relative %: 62 %
PLATELETS: 339 10*3/uL (ref 150–400)
RBC: 4.56 MIL/uL (ref 3.87–5.11)
RDW: 12.1 % (ref 11.5–15.5)
WBC: 6.7 10*3/uL (ref 4.0–10.5)

## 2016-01-03 LAB — URINALYSIS, ROUTINE W REFLEX MICROSCOPIC
BILIRUBIN URINE: NEGATIVE
Glucose, UA: >1000 mg/dL — AB
KETONES UR: NEGATIVE mg/dL
Leukocytes, UA: NEGATIVE
Nitrite: NEGATIVE
PROTEIN: NEGATIVE mg/dL
Specific Gravity, Urine: 1.02 (ref 1.005–1.030)
pH: 5.5 (ref 5.0–8.0)

## 2016-01-03 LAB — COMPREHENSIVE METABOLIC PANEL
ALBUMIN: 4.2 g/dL (ref 3.5–5.0)
ALT: 14 U/L (ref 14–54)
AST: 15 U/L (ref 15–41)
Alkaline Phosphatase: 61 U/L (ref 38–126)
Anion gap: 8 (ref 5–15)
BILIRUBIN TOTAL: 1 mg/dL (ref 0.3–1.2)
BUN: 11 mg/dL (ref 6–20)
CHLORIDE: 95 mmol/L — AB (ref 101–111)
CO2: 28 mmol/L (ref 22–32)
CREATININE: 0.72 mg/dL (ref 0.44–1.00)
Calcium: 9.5 mg/dL (ref 8.9–10.3)
GFR calc Af Amer: >60 mL/min (ref >60)
GLUCOSE: 325 mg/dL — AB (ref 65–99)
POTASSIUM: 4 mmol/L (ref 3.5–5.1)
Sodium: 131 mmol/L — ABNORMAL LOW (ref 135–145)
Total Protein: 7 g/dL (ref 6.5–8.1)

## 2016-01-03 LAB — URINE MICROSCOPIC-ADD ON: RBC / HPF: NONE SEEN RBC/hpf (ref 0–5)

## 2016-01-03 LAB — POCT PREGNANCY, URINE: PREG TEST UR: NEGATIVE

## 2016-01-03 MED ORDER — HYDROCHLOROTHIAZIDE 12.5 MG PO TABS: 25.0000 mg | ORAL_TABLET | Freq: Every day | ORAL | 3 refills | Status: DC

## 2016-01-03 MED ORDER — IBUPROFEN 600 MG PO TABS: 600.0000 mg | ORAL_TABLET | Freq: Four times a day (QID) | ORAL | 0 refills | Status: DC | PRN

## 2016-01-03 MED ORDER — IBUPROFEN 600 MG PO TABS
600.0000 mg | ORAL_TABLET | Freq: Once | ORAL | Status: AC
  Administered 2016-01-03: 600 mg via ORAL
  Filled 2016-01-03: qty 1

## 2016-01-03 NOTE — Discharge Instructions (Signed)
Blood Glucose Monitoring, Adult ° °Monitoring your blood glucose (also know as blood sugar) helps you to manage your diabetes. It also helps you and your health care provider monitor your diabetes and determine how well your treatment plan is working. °WHY SHOULD YOU MONITOR YOUR BLOOD GLUCOSE? °· It can help you understand how food, exercise, and medicine affect your blood glucose. °· It allows you to know what your blood glucose is at any given moment. You can quickly tell if you are having low blood glucose (hypoglycemia) or high blood glucose (hyperglycemia). °· It can help you and your health care provider know how to adjust your medicines. °· It can help you understand how to manage an illness or adjust medicine for exercise. °WHEN SHOULD YOU TEST? °Your health care provider will help you decide how often you should check your blood glucose. This may depend on the type of diabetes you have, your diabetes control, or the types of medicines you are taking. Be sure to write down all of your blood glucose readings so that this information can be reviewed with your health care provider. See below for examples of testing times that your health care provider may suggest. °Type 1 Diabetes °· Test at least 2 times per day if your diabetes is well controlled, if you are using an insulin pump, or if you perform multiple daily injections. °· If your diabetes is not well controlled or if you are sick, you may need to test more often. °· It is a good idea to also test: °¨ Before every insulin injection. °¨ Before and after exercise. °¨ Between meals and 2 hours after a meal. °¨ Occasionally between 2:00 a.m. and 3:00 a.m. °Type 2 Diabetes °· If you are taking insulin, test at least 2 times per day. However, it is best to test before every insulin injection. °· If you take medicines by mouth (orally), test 2 times a day. °· If you are on a controlled diet, test once a day. °· If your diabetes is not well controlled or if you  are sick, you may need to monitor more often. °HOW TO MONITOR YOUR BLOOD GLUCOSE °Supplies Needed °· Blood glucose meter. °· Test strips for your meter. Each meter has its own strips. You must use the strips that go with your own meter. °· A pricking needle (lancet). °· A device that holds the lancet (lancing device). °· A journal or log book to write down your results. °Procedure °· Wash your hands with soap and water. Alcohol is not preferred. °· Prick the side of your finger (not the tip) with the lancet. °· Gently milk the finger until a small drop of blood appears. °· Follow the instructions that come with your meter for inserting the test strip, applying blood to the strip, and using your blood glucose meter. °Other Areas to Get Blood for Testing °Some meters allow you to use other areas of your body (other than your finger) to test your blood. These areas are called alternative sites. The most common alternative sites are: °· The forearm. °· The thigh. °· The back area of the lower leg. °· The palm of the hand. °The blood flow in these areas is slower. Therefore, the blood glucose values you get may be delayed, and the numbers are different from what you would get from your fingers. Do not use alternative sites if you think you are having hypoglycemia. Your reading will not be accurate. Always use a finger if you   are having hypoglycemia. Also, if you cannot feel your lows (hypoglycemia unawareness), always use your fingers for your blood glucose checks. °ADDITIONAL TIPS FOR GLUCOSE MONITORING °· Do not reuse lancets. °· Always carry your supplies with you. °· All blood glucose meters have a 24-hour "hotline" number to call if you have questions or need help. °· Adjust (calibrate) your blood glucose meter with a control solution after finishing a few boxes of strips. °BLOOD GLUCOSE RECORD KEEPING °It is a good idea to keep a daily record or log of your blood glucose readings. Most glucose meters, if not all,  keep your glucose records stored in the meter. Some meters come with the ability to download your records to your home computer. Keeping a record of your blood glucose readings is especially helpful if you are wanting to look for patterns. Make notes to go along with the blood glucose readings because you might forget what happened at that exact time. Keeping good records helps you and your health care provider to work together to achieve good diabetes management.  °  °This information is not intended to replace advice given to you by your health care provider. Make sure you discuss any questions you have with your health care provider. °  °Document Released: 05/26/2003 Document Revised: 06/13/2014 Document Reviewed: 10/15/2012 °Elsevier Interactive Patient Education ©2016 [employer]. ° °

## 2016-01-03 NOTE — MAU Note (Signed)
Pt presents complaining of lower abdominal pain that started yesterday. States she started having frequency and pressure in her bladder. Felt a "pop" while urinating and had severe pain. Also having pink discharge. LMP 12/11/15.

## 2016-01-03 NOTE — MAU Provider Note (Signed)
Chief Complaint:  Abdominal Pain   First Provider Initiated Contact with Patient 01/03/16 1943      Shannon Caldwell is a 45 y.o. [id number] who presents to maternity admissions reporting lower abdominal pain , mostly right lower. Had to urinate yesterday and felt a pop in lower abdomen followed by pink discharge. It happened again today without the discharge. .She reports no vaginal bleeding, vaginal itching/burning, urinary symptoms, h/a, dizziness, n/v, or fever/chills.    Last pap a month ago by primary doctor. Has not seen Dr Benjie Karvonen in over a year. Period not due till Aug 17.  Declines STD testing  Abdominal Pain  This is a new problem. The current episode started yesterday. The onset quality is sudden. The problem occurs intermittently. The problem has been unchanged. The pain is located in the RLQ and LLQ. The pain is moderate. The quality of the pain is cramping and sharp. The abdominal pain does not radiate. Associated symptoms include dysuria. Pertinent negatives include no anorexia, constipation, diarrhea, fever, frequency, myalgias, nausea or vomiting. The pain is aggravated by palpation. The pain is relieved by nothing. She has tried nothing for the symptoms. Her past medical history is significant for abdominal surgery. Hx umbilical hernia repair    Past Medical History: Past Medical History:  Diagnosis Date  . Abnormal vaginal bleeding   . Blood transfusion   . Cervical pain (neck)    due to abscess   . Diabetes mellitus, type 2 ([place])    changed to type 1  . Generalized headaches   . GERD (gastroesophageal reflux disease)   . Hyperlipidemia   . Hypertension     Past obstetric history: OB History  Gravida Para Term Preterm AB Living  4 3 3   1 3   SAB TAB Ectopic Multiple Live Births  1            # Outcome Date GA Lbr Len/2nd Weight Sex Delivery Anes PTL Lv  4 SAB           3 Term           2 Term           1 Term               Past Surgical History: Past Surgical  History:  Procedure Laterality Date  . APPENDECTOMY    . CHOLECYSTECTOMY    . cytocyst removal    . HERNIA REPAIR  August 2000, August [id number]   umbilical hernia   . pancreatic debridgement      Family History: Family History  Problem Relation Age of Onset  . Hypertension Mother     grandfather  . Diabetes Mother     grandmother  . Stroke      grandparent  . Lung cancer Maternal Grandfather     Social History: Social History  Substance Use Topics  . Smoking status: Never Smoker  . Smokeless tobacco: Never Used  . Alcohol use No    Allergies:  Allergies  Allergen Reactions  . Morphine Hives and Itching    Meds:  Prescriptions Prior to Admission  Medication Sig Dispense Refill Last Dose  . hydrochlorothiazide (HYDRODIURIL) 12.5 MG tablet Take 1 tablet (12.5 mg total) by mouth daily. 90 tablet 3 01/03/2016 at Unknown time  . ibuprofen (ADVIL,MOTRIN) 600 MG tablet Take 1 tablet (600 mg total) by mouth every 8 (eight) hours as needed. 15 tablet 0 Past Week at Unknown time  . losartan (COZAAR) 50 MG tablet  Take 1 tablet (50 mg total) by mouth daily. 30 tablet 6 01/03/2016 at Unknown time  . rosuvastatin (CRESTOR) 20 MG tablet Take 1 tablet (20 mg total) by mouth daily. 30 tablet 6 01/03/2016 at Unknown time  . Insulin Glargine (BASAGLAR KWIKPEN) 100 UNIT/ML SOPN Inject 20 units into the skin at bedtime. 10 pen 0   . insulin lispro (HUMALOG KWIKPEN) 100 UNIT/ML KiwkPen Inject 7 to 11 units into the skin 3 (three) times daily before meals as instructed. 15 mL 4 Taking  . methocarbamol (ROBAXIN) 500 MG tablet Take 1 tablet (500 mg total) by mouth 2 (two) times daily. 20 tablet 0 Taking  . ondansetron (ZOFRAN ODT) 8 MG disintegrating tablet Take 1 tablet (8 mg total) by mouth every 8 (eight) hours as needed for nausea. 30 tablet 1 More than a month at Unknown time    I have reviewed patient's Past Medical Hx, Surgical Hx, Family Hx, Social Hx, medications and allergies.  ROS:   Review of Systems  Constitutional: Negative for chills and fever.  Gastrointestinal: Positive for abdominal pain. Negative for anorexia, constipation, diarrhea, nausea and vomiting.  Genitourinary: Positive for dysuria. Negative for frequency.  Musculoskeletal: Negative for myalgias.   Other systems negative     Physical Exam  Patient Vitals for the past 24 hrs:  BP Temp Temp src Pulse Resp  01/03/16 1926 152/90 - - 84 -  01/03/16 1914 189/96 98.6 F (37 C) Oral 90 18   Constitutional: Well-developed, well-nourished female in no acute distress.  Cardiovascular: normal rate and rhythm Respiratory: normal effort, no distress.  GI: Abd soft, non-tender.  Nondistended.  No rebound, No guarding.  Umbilical hernia intact MS: Extremities nontender, no edema, normal ROM Neurologic: Alert and oriented x 4.   Grossly nonfocal. GU: Neg CVAT. Skin:  Warm and Dry Psych:  Affect appropriate.  PELVIC EXAM: Cervix firm, anterior, neg CMT, uterus nontender, nonenlarged, adnexa slight tenderness, with no enlargement, or mass    Labs:    Imaging:  No results found.  MAU Course/MDM: I have ordered labs as follows: Imaging ordered:  Results reviewed.   .      Assessment: No diagnosis found.  Plan: Report given to oncoming provider.    Medication List    ASK your doctor about these medications   BASAGLAR KWIKPEN 100 UNIT/ML Sopn Inject 20 units into the skin at bedtime.   hydrochlorothiazide 12.5 MG tablet Commonly known as:  HYDRODIURIL Take 1 tablet (12.5 mg total) by mouth daily.   ibuprofen 600 MG tablet Commonly known as:  ADVIL,MOTRIN Take 1 tablet (600 mg total) by mouth every 8 (eight) hours as needed.   insulin lispro 100 UNIT/ML KiwkPen Commonly known as:  HUMALOG KWIKPEN Inject 7 to 11 units into the skin 3 (three) times daily before meals as instructed.   losartan 50 MG tablet Commonly known as:  COZAAR Take 1 tablet (50 mg total) by mouth daily.    methocarbamol 500 MG tablet Commonly known as:  ROBAXIN Take 1 tablet (500 mg total) by mouth 2 (two) times daily.   ondansetron 8 MG disintegrating tablet Commonly known as:  ZOFRAN ODT Take 1 tablet (8 mg total) by mouth every 8 (eight) hours as needed for nausea.   rosuvastatin 20 MG tablet Commonly known as:  CRESTOR Take 1 tablet (20 mg total) by mouth daily.      Encouraged to return here or to other Urgent Care/ED if she develops worsening of symptoms, increase in  pain, fever, or other concerning symptoms.   Hansel Feinstein CNM, MSN Certified Nurse-Midwife 01/03/2016 7:49 PM  2040 Pt reports pain is continuing at an 8/10.  Pain is described as sharp.  Order pelvic ultrasound.  Pt declines headache, vision changes.  No neurological complaints.    Results for orders placed or performed during the hospital encounter of 01/03/16 (from the past 24 hour(s))  Urinalysis, Routine w reflex microscopic (not at [facility])     Status: Abnormal   Collection Time: 01/03/16  7:10 PM  Result Value Ref Range   Color, Urine YELLOW YELLOW   APPearance CLEAR CLEAR   Specific Gravity, Urine 1.020 1.005 - 1.030   pH 5.5 5.0 - 8.0   Glucose, UA >1000 (A) NEGATIVE mg/dL   Hgb urine dipstick TRACE (A) NEGATIVE   Bilirubin Urine NEGATIVE NEGATIVE   Ketones, ur NEGATIVE NEGATIVE mg/dL   Protein, ur NEGATIVE NEGATIVE mg/dL   Nitrite NEGATIVE NEGATIVE   Leukocytes, UA NEGATIVE NEGATIVE  Urine microscopic-add on     Status: Abnormal   Collection Time: 01/03/16  7:10 PM  Result Value Ref Range   Squamous Epithelial / LPF 0-5 (A) NONE SEEN   WBC, UA 0-5 0 - 5 WBC/hpf   RBC / HPF NONE SEEN 0 - 5 RBC/hpf   Bacteria, UA FEW (A) NONE SEEN   Urine-Other MUCOUS PRESENT   Pregnancy, urine POC     Status: None   Collection Time: 01/03/16  7:30 PM  Result Value Ref Range   Preg Test, Ur NEGATIVE NEGATIVE  CBC with Differential/Platelet     Status: None   Collection Time: 01/03/16  8:10 PM  Result Value  Ref Range   WBC 6.7 4.0 - 10.5 K/uL   RBC 4.56 3.87 - 5.11 MIL/uL   Hemoglobin 13.0 12.0 - 15.0 g/dL   HCT 38.0 36.0 - 46.0 %   MCV 83.3 78.0 - 100.0 fL   MCH 28.5 26.0 - 34.0 pg   MCHC 34.2 30.0 - 36.0 g/dL   RDW 12.1 11.5 - 15.5 %   Platelets 339 150 - 400 K/uL   Neutrophils Relative % 62 %   Neutro Abs 4.1 1.7 - 7.7 K/uL   Lymphocytes Relative 34 %   Lymphs Abs 2.3 0.7 - 4.0 K/uL   Monocytes Relative 4 %   Monocytes Absolute 0.3 0.1 - 1.0 K/uL   Eosinophils Relative 0 %   Eosinophils Absolute 0.0 0.0 - 0.7 K/uL   Basophils Relative 0 %   Basophils Absolute 0.0 0.0 - 0.1 K/uL  Comprehensive metabolic panel     Status: Abnormal   Collection Time: 01/03/16  8:10 PM  Result Value Ref Range   Sodium 131 (L) 135 - 145 mmol/L   Potassium 4.0 3.5 - 5.1 mmol/L   Chloride 95 (L) 101 - 111 mmol/L   CO2 28 22 - 32 mmol/L   Glucose, Bld 325 (H) 65 - 99 mg/dL   BUN 11 6 - 20 mg/dL   Creatinine, Ser 0.72 0.44 - 1.00 mg/dL   Calcium 9.5 8.9 - 10.3 mg/dL   Total Protein 7.0 6.5 - 8.1 g/dL   Albumin 4.2 3.5 - 5.0 g/dL   AST 15 15 - 41 U/L   ALT 14 14 - 54 U/L   Alkaline Phosphatase 61 38 - 126 U/L   Total Bilirubin 1.0 0.3 - 1.2 mg/dL   GFR calc non Af Amer >60 >60 mL/min   GFR calc Af Amer >60 >60 mL/min  Anion gap 8 5 - 15   Ultrasound: IMPRESSION: 1. No acute finding to explain abdominal pain. 2. 23 mm intramural fibroid. 3. Chronic endometrial calcification. Electronically Signed  2215 Dr. Kennon Rounds reviewed labs and medical history > no further intervention needed, recommended restarting insulin.  Upon further review of chart it appears order was called in by patient's primary care office on 12/30/15.  Pt not aware of RX.  Pt plans to pick up insulin tomorrow and begin.    Assessment: Pelvic Pain - normal exam Diabetes - Poor control Chronic Hypertension  Plan: Discharge home Restart insulin Increase HCTZ 25 mg Keep appointment with PCP  Gwen Pounds, CNM

## 2016-02-02 ENCOUNTER — Ambulatory Visit: Payer: Managed Care, Other (non HMO) | Admitting: Internal Medicine

## 2016-02-03 ENCOUNTER — Ambulatory Visit: Payer: Managed Care, Other (non HMO) | Admitting: Internal Medicine

## 2016-02-03 DIAGNOSIS — Z0289 Encounter for other administrative examinations: Secondary | ICD-10-CM

## 2016-02-19 ENCOUNTER — Encounter: Payer: Self-pay | Admitting: Family Medicine

## 2016-02-19 ENCOUNTER — Ambulatory Visit (INDEPENDENT_AMBULATORY_CARE_PROVIDER_SITE_OTHER): Payer: Managed Care, Other (non HMO) | Admitting: Family Medicine

## 2016-02-19 VITALS — BP 140/90 | HR 98 | Temp 98.5°F | Resp 17 | Ht 66.0 in | Wt 159.4 lb

## 2016-02-19 DIAGNOSIS — Z23 Encounter for immunization: Secondary | ICD-10-CM | POA: Diagnosis not present

## 2016-02-19 DIAGNOSIS — R350 Frequency of micturition: Secondary | ICD-10-CM

## 2016-02-19 DIAGNOSIS — F411 Generalized anxiety disorder: Secondary | ICD-10-CM | POA: Diagnosis not present

## 2016-02-19 DIAGNOSIS — R319 Hematuria, unspecified: Secondary | ICD-10-CM | POA: Diagnosis not present

## 2016-02-19 LAB — POCT URINALYSIS DIPSTICK
BILIRUBIN UA: NEGATIVE
Leukocytes, UA: NEGATIVE
NITRITE UA: NEGATIVE
PH UA: 5
SPEC GRAV UA: 1.015
Urobilinogen, UA: 0.2

## 2016-02-19 MED ORDER — CITALOPRAM HYDROBROMIDE 20 MG PO TABS: 20.0000 mg | ORAL_TABLET | Freq: Every day | ORAL | 3 refills | Status: DC

## 2016-02-19 MED ORDER — CEPHALEXIN 500 MG PO CAPS: 500.0000 mg | ORAL_CAPSULE | Freq: Two times a day (BID) | ORAL | 0 refills | Status: AC

## 2016-02-19 NOTE — Progress Notes (Signed)
   Subjective:    Patient ID: Shannon Caldwell, female    DOB: 1971/03/10, 45 y.o.   MRN: [id number]  HPI ? UTI- sxs have been ongoing since May.  Went to hospital in July due to pelvic pressure but no obvious cause.  Intermittent burning w/ urination.  Continues to have urinary frequency.  No blood in urine.  + pelvic pressure.  + odor to urine, 'ammonia'.  Anxiety- pt reports this is worse than usual.  Impacting sleep.  Difficulty w/ focus during the day.  Pt has a new position at work that requires travel and she's not sure if it's the travel or the increased responsibility.  Review of Systems For ROS see HPI     Objective:   Physical Exam  Constitutional: She is oriented to person, place, and time. She appears well-developed and well-nourished. No distress.  HENT:  Head: Normocephalic and atraumatic.  Abdominal: Soft. Bowel sounds are normal. She exhibits no distension. There is tenderness (TTP over L pelvis, no CVA tenderness). There is no rebound and no guarding.  Neurological: She is alert and oriented to person, place, and time.  Skin: Skin is dry.  Psychiatric: She has a normal mood and affect. Her behavior is normal. Thought content normal.  Vitals reviewed.         Assessment & Plan:  Frequency- again discussed w/ pt that the frequency may be due to her very high sugar and the 4+ glucose in her urine.  But this would not explain her pelvic pain.  Pt had [place] at [facility] in July that was unrevealing.  Start Keflex for possible UTI but if urine cx (-), will need pt to call GYN for appt.  Pt expressed understanding and is in agreement w/ plan.   Anxiety- new.  Pt has new job and is having much higher anxiety.  Having difficulty sleeping at night and focusing during the day.  Will start Celexa daily and monitor for improvement.  Pt expressed understanding and is in agreement w/ plan.

## 2016-02-19 NOTE — Progress Notes (Signed)
Pre visit review using our clinic review tool, if applicable. No additional management support is needed unless otherwise documented below in the visit note. 

## 2016-02-19 NOTE — Patient Instructions (Signed)
Follow up in 1 month to recheck anxiety Start the Celexa (Citalopram) daily for anxiety Take the Keflex for possible UTI.  If the culture is negative, we'll have you call GYN to evaluate the pelvic pain Drink plenty of fluids Call with any questions or concerns Hang in there!!!

## 2016-02-22 LAB — URINE CULTURE

## 2016-02-26 ENCOUNTER — Other Ambulatory Visit: Payer: Self-pay | Admitting: Family Medicine

## 2016-02-26 ENCOUNTER — Telehealth: Payer: Self-pay | Admitting: Emergency Medicine

## 2016-02-26 DIAGNOSIS — R350 Frequency of micturition: Secondary | ICD-10-CM

## 2016-02-26 NOTE — Telephone Encounter (Signed)
Patient called stating she has today dose and tomorrow dose of the antibiotic. She states the odor has resolved but still having frequency. Patient is going out of town on Sunday for 2 weeks and wants to make sure the infection is cleared. Please advise

## 2016-02-26 NOTE — Telephone Encounter (Signed)
Spoke with patient advised she could drop off a urine specimen to recheck her urine to make sure the infection has resolved. Patient is unable to make it to the office to drop off specimen in time. Offered the [place] or [place] location to drop off specimen before 5 pm. She will finish up her antibiotics and if symptoms worsens she will go to an urgent care in [place].

## 2016-02-26 NOTE — Telephone Encounter (Signed)
Can come for a lab visit for a UA and possible repeat culture

## 2016-02-26 NOTE — Telephone Encounter (Signed)
You please call pt and schedule her a lab visit. Order for UA is in the system, ok for culture if anything is in urine.

## 2016-02-26 NOTE — Addendum Note (Signed)
Addended by: Davis Gourd on: 02/26/2016 01:34 PM   Modules accepted: Orders

## 2016-03-18 ENCOUNTER — Ambulatory Visit: Payer: Managed Care, Other (non HMO) | Admitting: Family Medicine

## 2016-04-07 ENCOUNTER — Ambulatory Visit (INDEPENDENT_AMBULATORY_CARE_PROVIDER_SITE_OTHER): Payer: Managed Care, Other (non HMO) | Admitting: Family Medicine

## 2016-04-07 ENCOUNTER — Encounter: Payer: Self-pay | Admitting: Family Medicine

## 2016-04-07 VITALS — BP 123/85 | HR 89 | Temp 98.9°F | Resp 16 | Ht 66.0 in | Wt 160.4 lb

## 2016-04-07 DIAGNOSIS — F411 Generalized anxiety disorder: Secondary | ICD-10-CM | POA: Diagnosis not present

## 2016-04-07 DIAGNOSIS — Z23 Encounter for immunization: Secondary | ICD-10-CM | POA: Diagnosis not present

## 2016-04-07 NOTE — Progress Notes (Signed)
   Subjective:    Patient ID: Shannon Caldwell, female    DOB: [birth year], 45 y.o.   MRN: [id number]  HPI Anxiety- pt was started on Celexa at last visit due to new job and increased stressors.  Pt feels this is working well.  'just takes the edge off'.  Sleeping better.  Less irritable, less edgy.  Pt has plans to relocate to [place].  Due for Tdap.  UTD on flu.   Review of Systems For ROS see HPI     Objective:   Physical Exam  Constitutional: She is oriented to person, place, and time. She appears well-developed and well-nourished. No distress.  HENT:  Head: Normocephalic and atraumatic.  Neurological: She is alert and oriented to person, place, and time.  Skin: Skin is warm and dry.  Psychiatric: She has a normal mood and affect. Her behavior is normal. Thought content normal.  Vitals reviewed.         Assessment & Plan:

## 2016-04-07 NOTE — Progress Notes (Signed)
Pre visit review using our clinic review tool, if applicable. No additional management support is needed unless otherwise documented below in the visit note. 

## 2016-04-07 NOTE — Patient Instructions (Signed)
Schedule your complete physical in 3-4 months (if able!) Continue the Celexa once daily for the anxiety- you're doing great! Call with any questions or concerns CONGRATS ON THE NEW POSITION!!!  I'm SO proud of you!!!

## 2016-04-07 NOTE — Addendum Note (Signed)
Addended by: Davis Gourd on: 04/07/2016 03:50 PM   Modules accepted: Orders

## 2016-04-07 NOTE — Assessment & Plan Note (Signed)
Improved since starting Celexa at last visit.  Pt feeling much better.  She is excited about her new job and the opportunity it presents.  Will follow.

## 2016-09-16 ENCOUNTER — Encounter: Payer: Managed Care, Other (non HMO) | Admitting: Family Medicine

## 2017-10-24 ENCOUNTER — Encounter: Payer: Self-pay | Admitting: General Practice

## 2017-11-05 IMAGING — CT CT ABD-PELV W/ CM
1 of 3 series · 13 of 32 positions shown, 18 images · IV contrast (APPLIED)
Comparison: 11/20/2013 report

CLINICAL DATA: Left lower quadrant abdominal pain for 1 week.
Dysuria. Nausea.

EXAM:
CT ABDOMEN AND PELVIS WITH CONTRAST
TECHNIQUE: Multidetector CT imaging of the abdomen and pelvis was performed
using the standard protocol following bolus administration of
intravenous contrast.
CONTRAST:  25mL OMNIPAQUE IOHEXOL 300 MG/ML SOLN, 100mL OMNIPAQUE
IOHEXOL 300 MG/ML SOLN

[Series 2: abd/pelvis 5.0 b31f · axial · 0.90mm/px · z∈[-462,-22]mm · 13 of 100 slices shown, 18 images]
[im 6/100  soft-tissue]
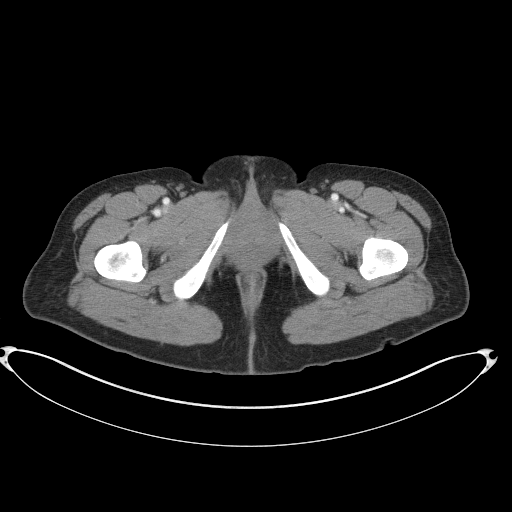
[im 6/100  bone]
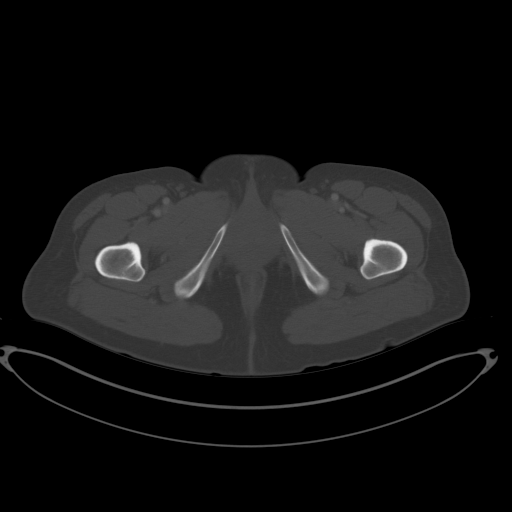
[im 17/100  soft-tissue]
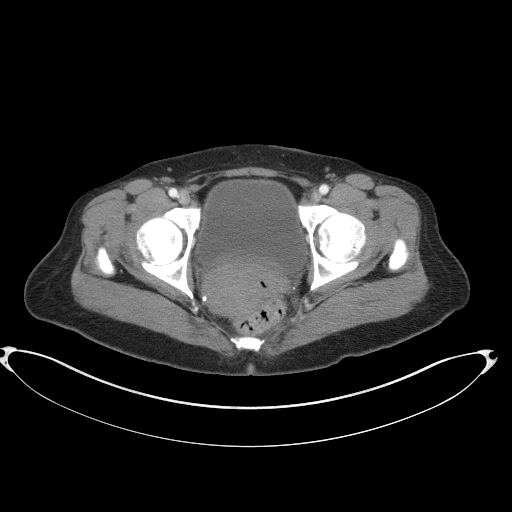
[im 23/100  soft-tissue]
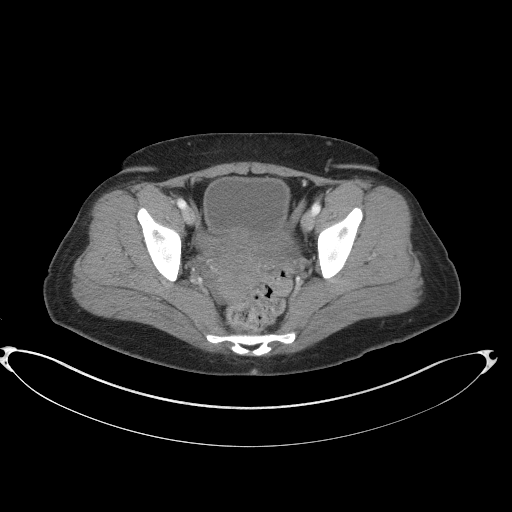
[im 28/100  soft-tissue]
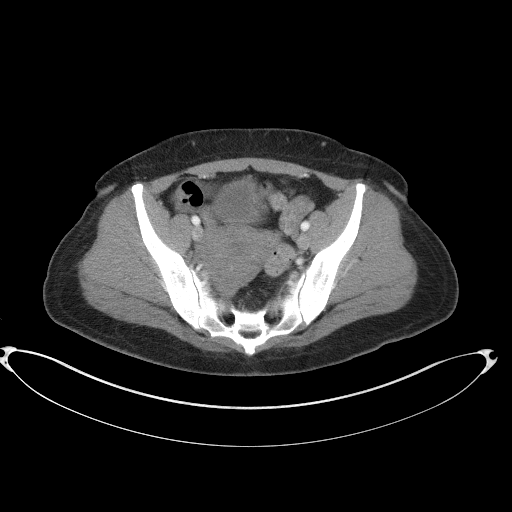
[im 39/100  soft-tissue]
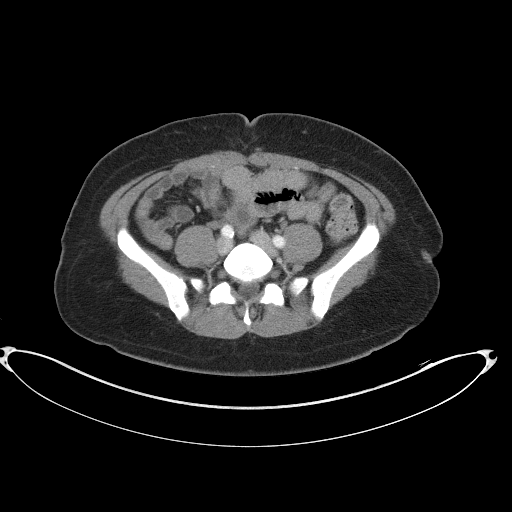
[im 45/100  soft-tissue]
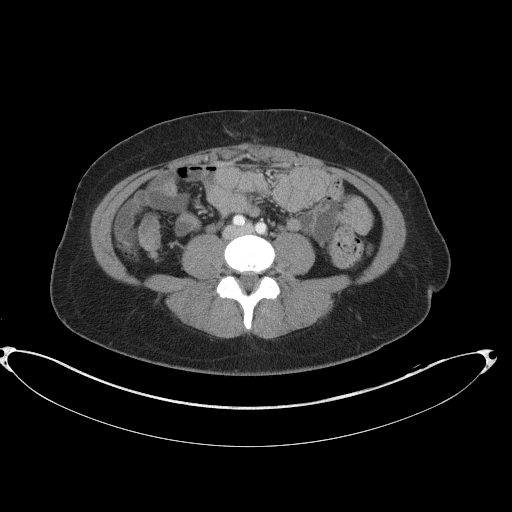
[im 56/100  soft-tissue]
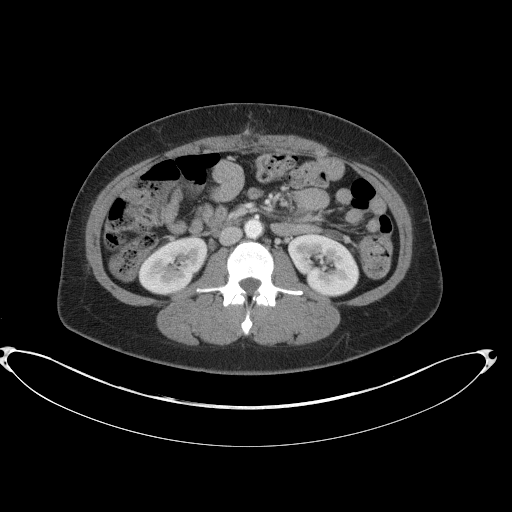
[im 61/100  soft-tissue]
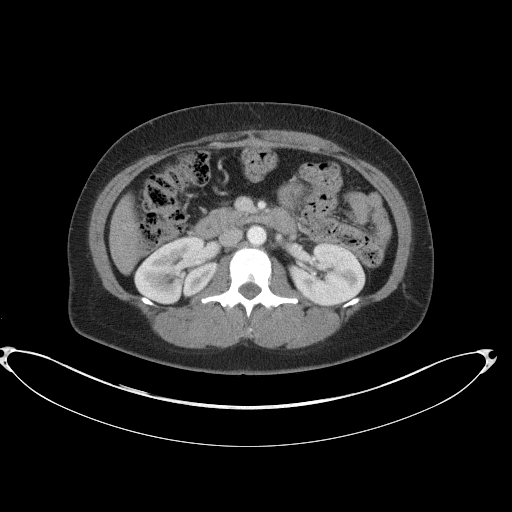
[im 72/100  soft-tissue]
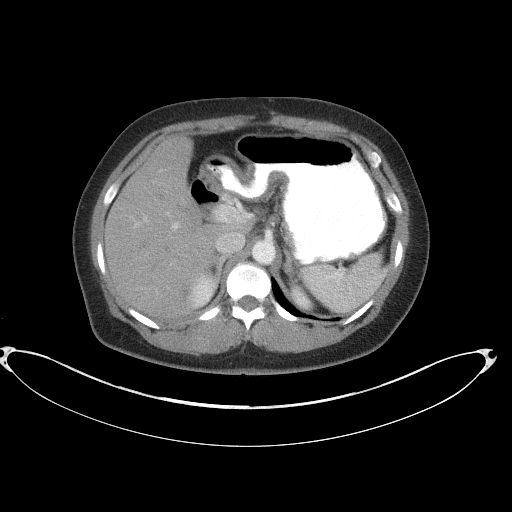
[im 72/100  bone]
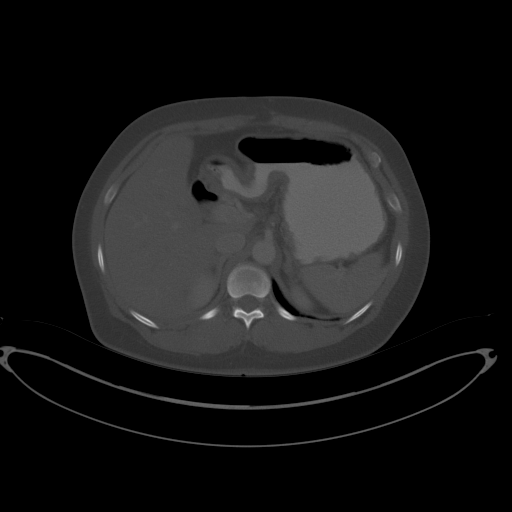
[im 78/100  soft-tissue]
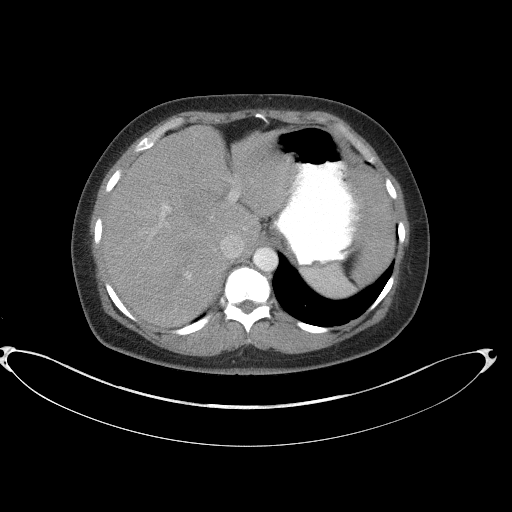
[im 78/100  lung]
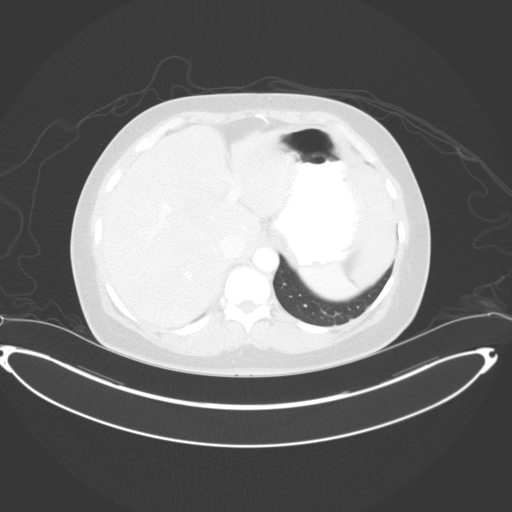
[im 83/100  soft-tissue]
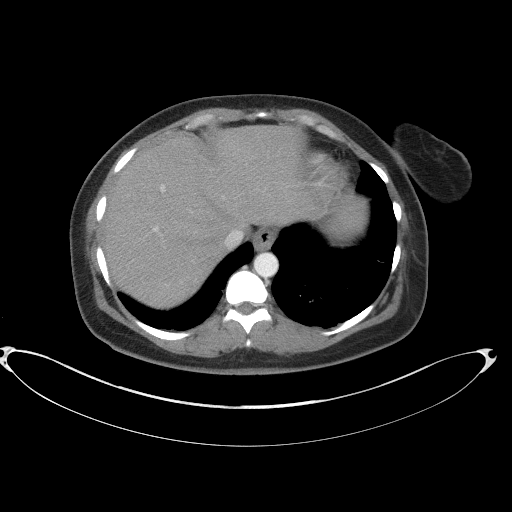
[im 83/100  lung]
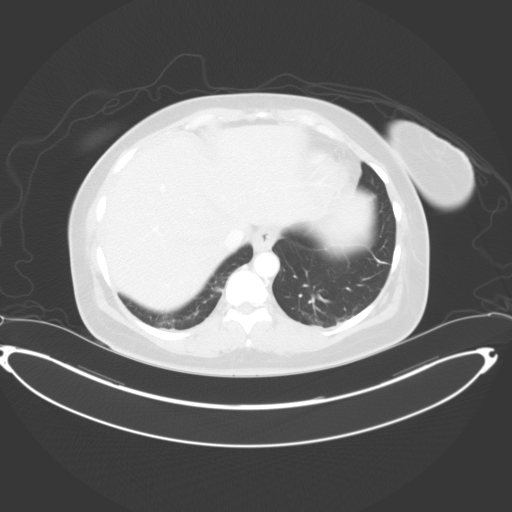
[im 89/100  lung]
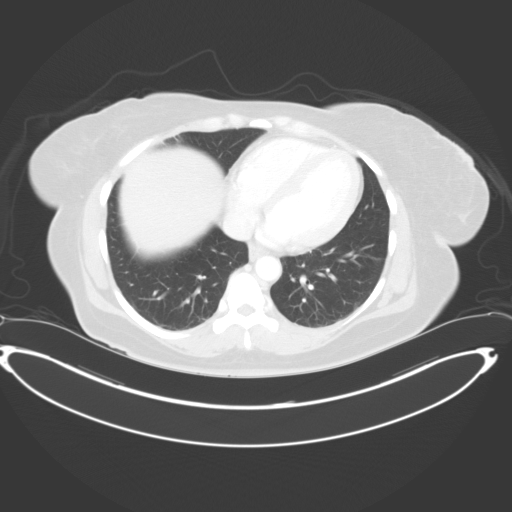
[im 94/100  soft-tissue]
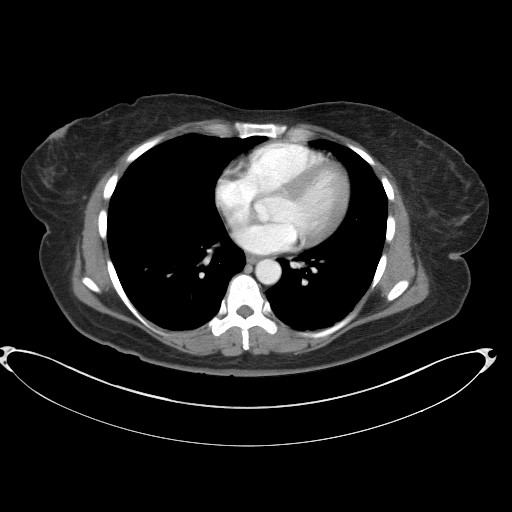
[im 94/100  lung]
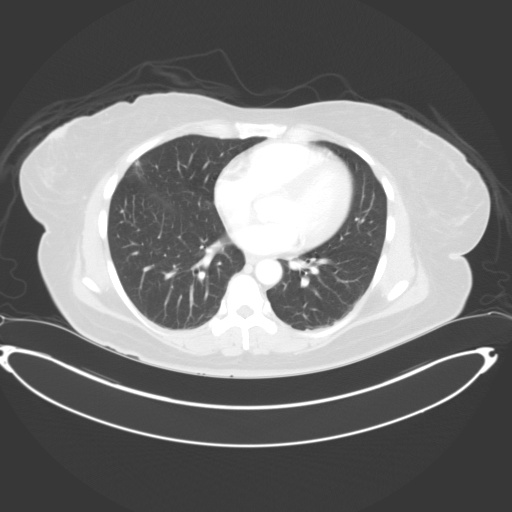

[13 of 32 positions shown; findings below may reference images not displayed]

FINDINGS: Lower chest: Mild subsegmental atelectasis in the posterior basal
segments of both lower lobes.

Hepatobiliary: Cholecystectomy.

Pancreas: Severely atrophic pancreatic body and tail, possibly with
a dilated duct in this vicinity as on image 34 series 2. Pancreatic
head and adjacent portion of the pancreatic body appear intact and
potentially even slightly hypertrophic. Appearance not changed from
the MRCP from 05/06/2010.

Spleen: Unremarkable

Adrenals/Urinary Tract: Unremarkable

Stomach/Bowel: Unremarkable

Vascular/Lymphatic: Mild aortoiliac atherosclerotic vascular
calcification. No pathologic adenopathy.

Reproductive: Posterior enhancement in the uterine body measuring
about 12 mm, probably from a fibroid on image 62 series 5.
Endometrium measures up to 1.2 cm in thickness, and there is a
linear 6 mm calcific density along the endometrium on image 66
series 6. Probable follicle in the left ovary on image 75 series 2.
Right ovary is somewhat obscured against the uterine margin.

Other: Possible ventral hernia mesh along the abdomen.

Musculoskeletal:  Unremarkable
IMPRESSION: 1. A cause for the patient's left lower quadrant abdominal pain and
dysuria is not identified. The left kidney there is normal and no
specific bowel abnormality is visible in this vicinity. There is a
small complex lesion of the left ovary which is most likely a
follicle, but the patient's pain is pelvic in origin than pelvic
sonography may help in assessing this and some of the other
findings, such as the suspected posterior uterine fibroid and the
small calcification along the endometrium.
2. Postoperative findings in the pancreas, with a severely atrophic
pancreatic tail. Appearance similar to 05/06/2010.
3. Mild atherosclerosis.

## 2018-07-14 IMAGING — US US PELVIS COMPLETE
1 series · 15 of 25 positions shown · non-contrast
Comparison: Abdominal CT 04/27/2015

CLINICAL DATA: Abdominal pain in female



[Series 1: us pelvis complete · 15 of 51 slices shown]
[im 1/51]
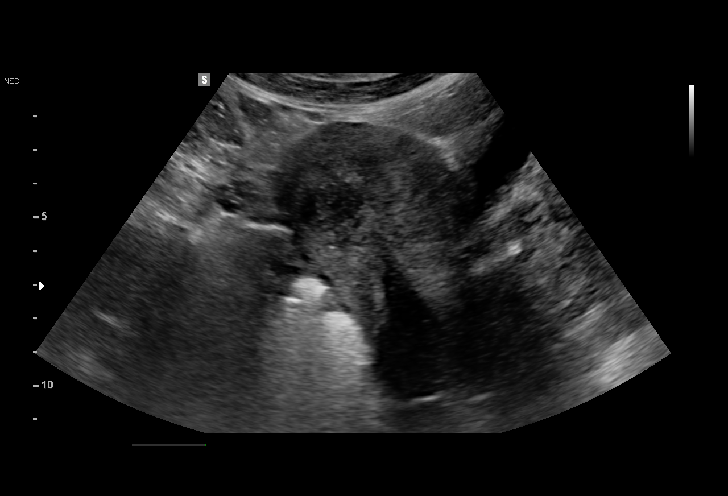
[im 5/51]
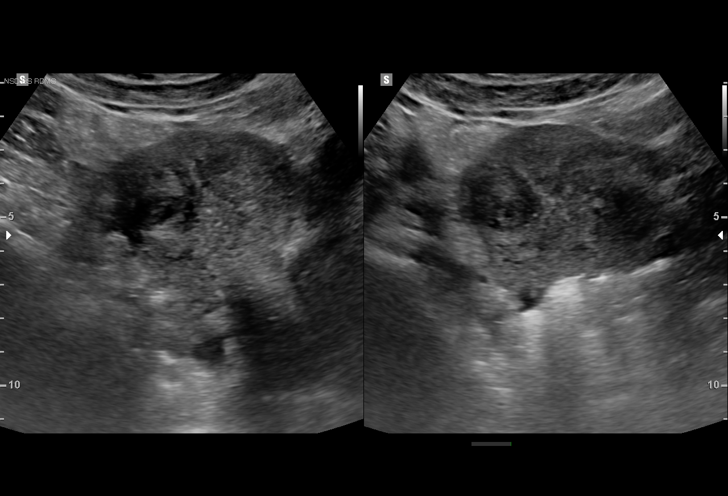
[im 9/51]
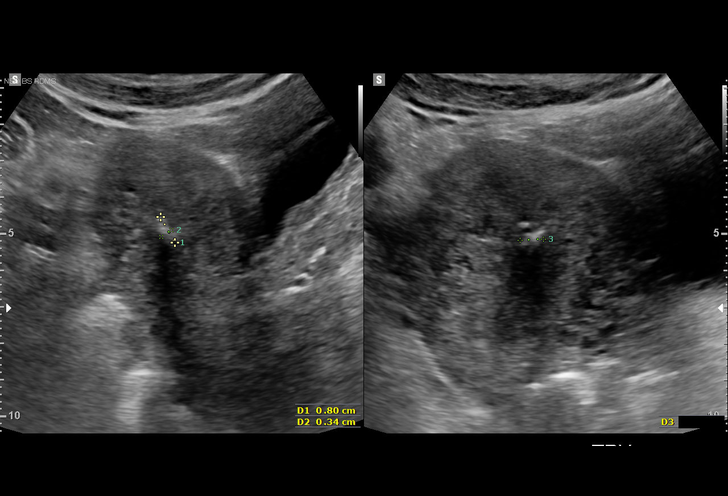
[im 11/51]
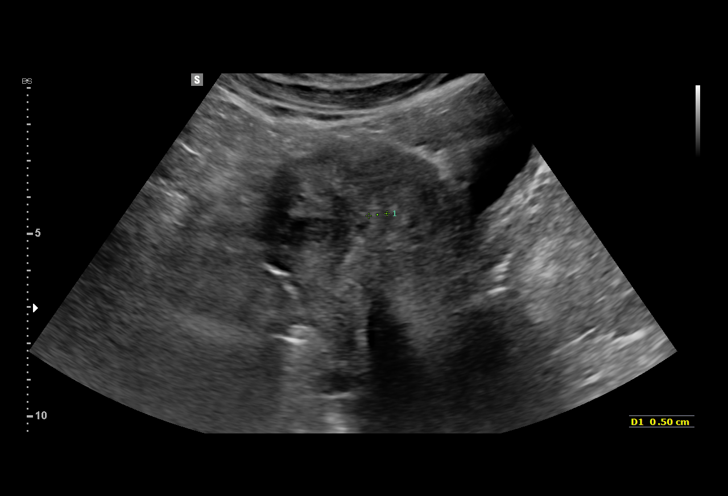
[im 15/51]
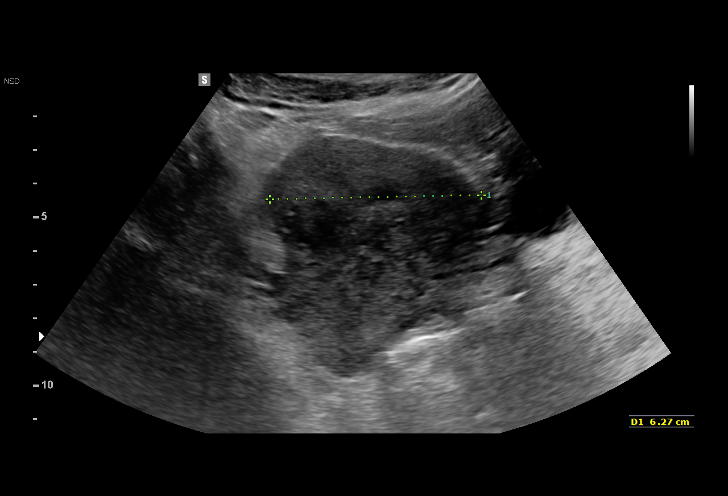
[im 19/51]
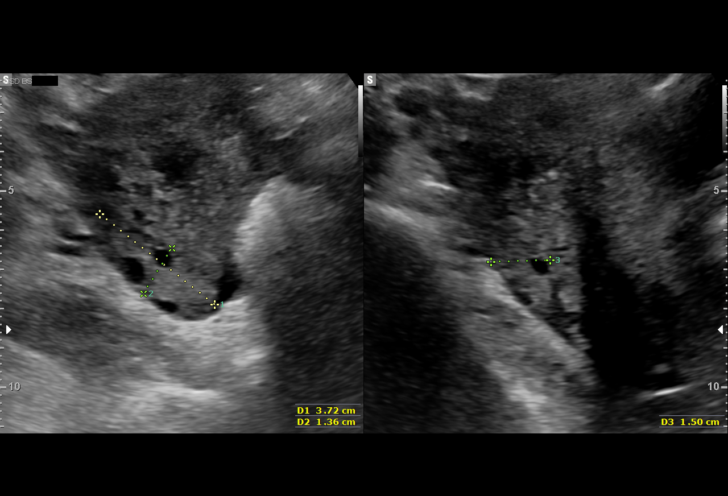
[im 21/51]
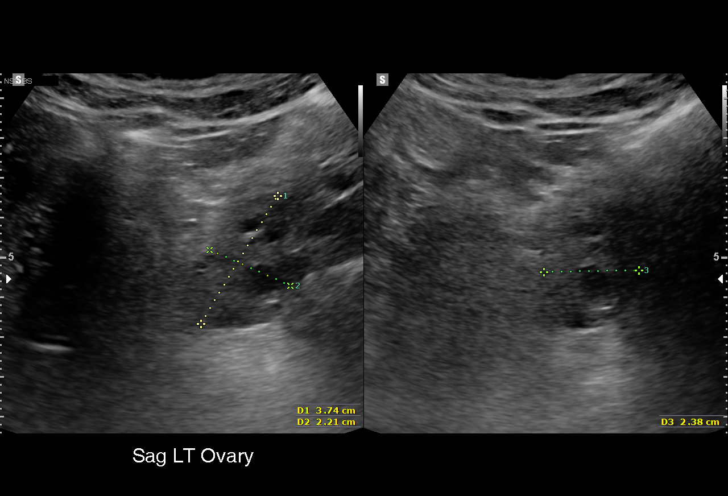
[im 26/51]
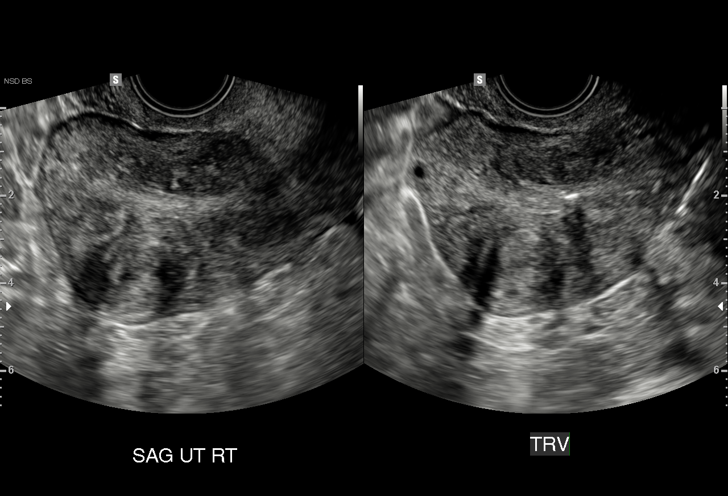
[im 30/51]
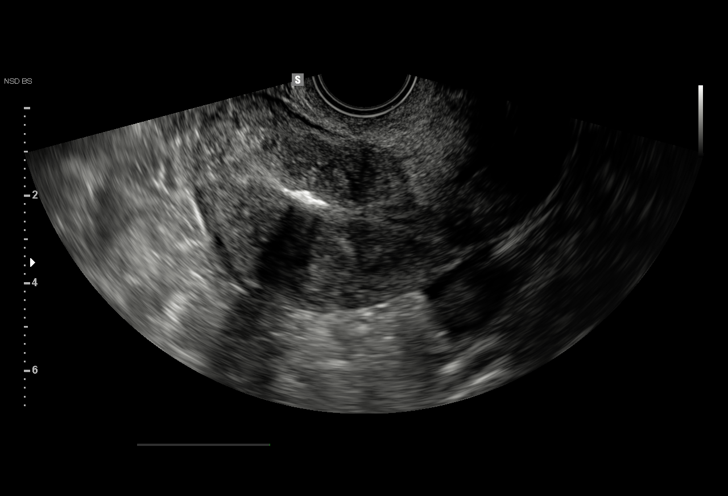
[im 32/51]
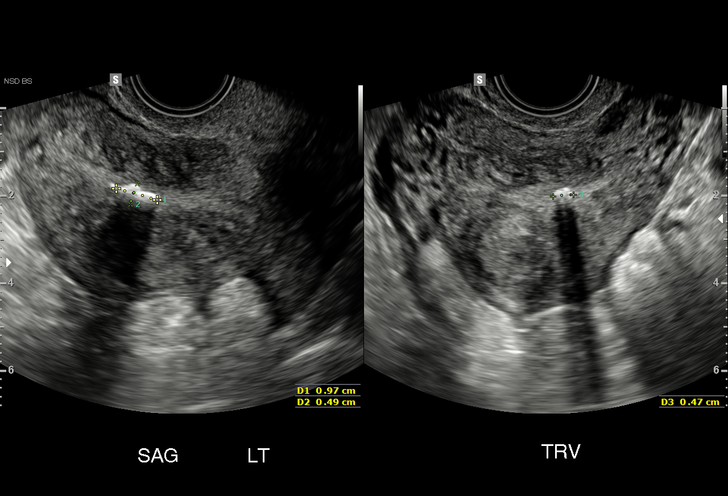
[im 36/51]
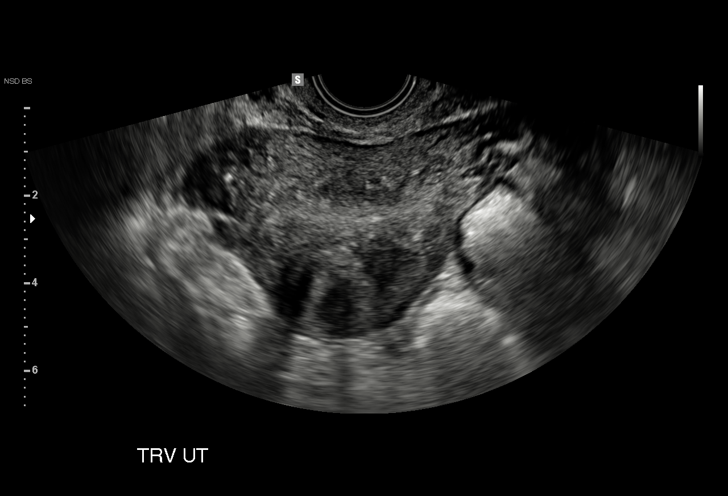
[im 40/51]
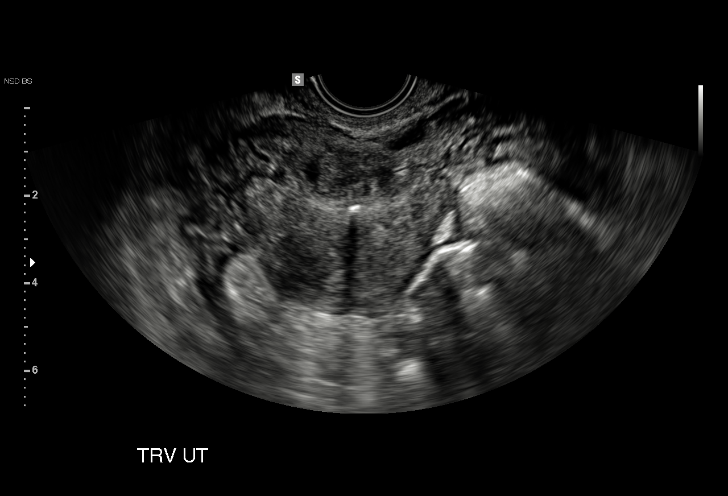
[im 42/51]
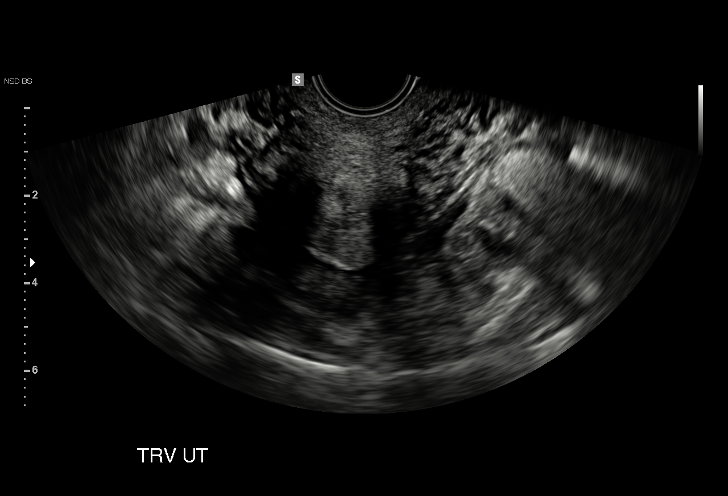
[im 46/51]
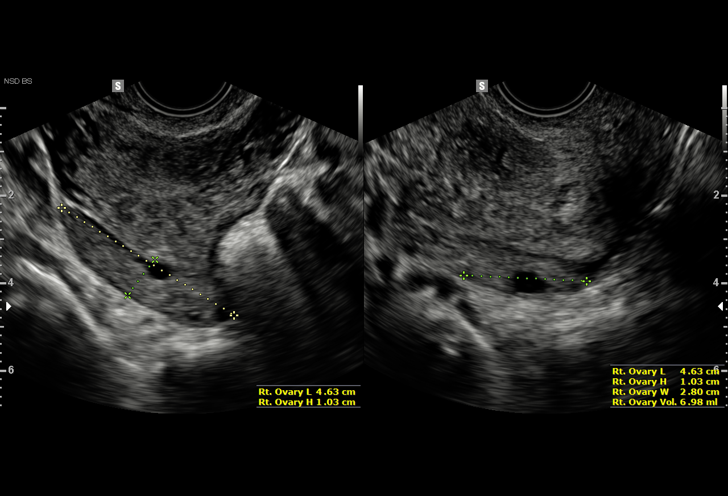
[im 51/51]
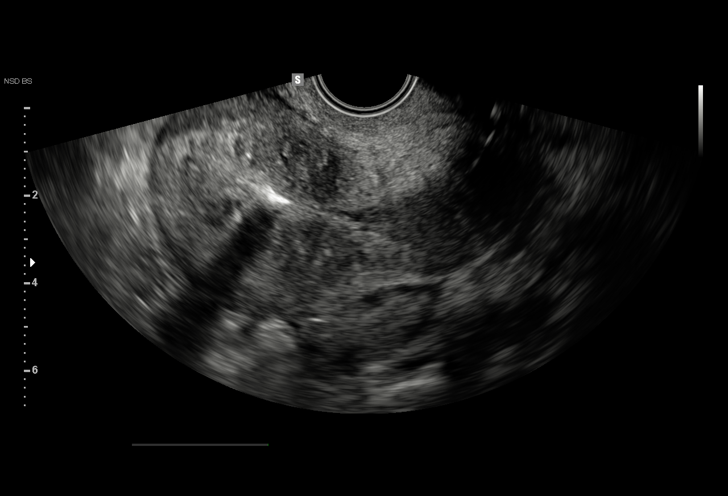

[15 of 25 positions shown; findings below may reference images not displayed]

FINDINGS: Uterus

Measurements: 10 x 5 x 7 cm. Intramural heterogeneous mass measuring
up to 23 mm.

Endometrium

Thickness: 8 mm. Dense shadowing in the endometrial cavity which is
chronic based on comparison CT. Size comparison is limited given
cross modality differences. No neighboring mass lesion is seen.

Right ovary

Measurements: 46 x 10 x 28 mm. Normal appearance/no adnexal mass.

Left ovary

Measurements: 40 x 24 x 25 mm. Normal appearance/no adnexal mass.

Other findings

No abnormal free fluid.
IMPRESSION: 1. No acute finding to explain abdominal pain.
2. 23 mm intramural fibroid.
3. Chronic endometrial calcification.

## 2018-12-16 DIAGNOSIS — K851 Biliary acute pancreatitis without necrosis or infection: Secondary | ICD-10-CM | POA: Insufficient documentation

## 2019-09-02 DIAGNOSIS — I5032 Chronic diastolic (congestive) heart failure: Secondary | ICD-10-CM | POA: Insufficient documentation

## 2019-10-04 DIAGNOSIS — J984 Other disorders of lung: Secondary | ICD-10-CM | POA: Insufficient documentation

## 2019-10-04 DIAGNOSIS — J986 Disorders of diaphragm: Secondary | ICD-10-CM | POA: Insufficient documentation

## 2020-07-07 DIAGNOSIS — Z992 Dependence on renal dialysis: Secondary | ICD-10-CM

## 2020-07-07 DIAGNOSIS — N184 Chronic kidney disease, stage 4 (severe): Secondary | ICD-10-CM

## 2020-07-07 HISTORY — DX: Dependence on renal dialysis: Z99.2

## 2020-07-07 HISTORY — DX: Chronic kidney disease, stage 4 (severe): N18.4

## 2020-09-19 ENCOUNTER — Encounter (HOSPITAL_BASED_OUTPATIENT_CLINIC_OR_DEPARTMENT_OTHER): Payer: Self-pay | Admitting: Emergency Medicine

## 2020-09-19 ENCOUNTER — Emergency Department (HOSPITAL_BASED_OUTPATIENT_CLINIC_OR_DEPARTMENT_OTHER)
Admission: EM | Admit: 2020-09-19 | Discharge: 2020-09-19 | Disposition: A | Discharge status: Home / Self Care | Payer: 59 | Attending: Emergency Medicine | Admitting: Emergency Medicine

## 2020-09-19 ENCOUNTER — Other Ambulatory Visit: Payer: Self-pay

## 2020-09-19 DIAGNOSIS — E1122 Type 2 diabetes mellitus with diabetic chronic kidney disease: Secondary | ICD-10-CM | POA: Insufficient documentation

## 2020-09-19 DIAGNOSIS — R220 Localized swelling, mass and lump, head: Secondary | ICD-10-CM | POA: Diagnosis present

## 2020-09-19 DIAGNOSIS — Z8616 Personal history of COVID-19: Secondary | ICD-10-CM | POA: Diagnosis not present

## 2020-09-19 DIAGNOSIS — L723 Sebaceous cyst: Secondary | ICD-10-CM

## 2020-09-19 DIAGNOSIS — Z794 Long term (current) use of insulin: Secondary | ICD-10-CM | POA: Diagnosis not present

## 2020-09-19 DIAGNOSIS — I129 Hypertensive chronic kidney disease with stage 1 through stage 4 chronic kidney disease, or unspecified chronic kidney disease: Secondary | ICD-10-CM | POA: Diagnosis not present

## 2020-09-19 DIAGNOSIS — E1169 Type 2 diabetes mellitus with other specified complication: Secondary | ICD-10-CM | POA: Diagnosis not present

## 2020-09-19 DIAGNOSIS — E785 Hyperlipidemia, unspecified: Secondary | ICD-10-CM | POA: Insufficient documentation

## 2020-09-19 DIAGNOSIS — N184 Chronic kidney disease, stage 4 (severe): Secondary | ICD-10-CM | POA: Insufficient documentation

## 2020-09-19 DIAGNOSIS — Z79899 Other long term (current) drug therapy: Secondary | ICD-10-CM | POA: Diagnosis not present

## 2020-09-19 LAB — CBG MONITORING, ED: Glucose-Capillary: 183 mg/dL — ABNORMAL HIGH (ref 70–99)

## 2020-09-19 MED ORDER — SULFAMETHOXAZOLE-TRIMETHOPRIM 800-160 MG PO TABS
1.0000 | ORAL_TABLET | Freq: Once | ORAL | Status: AC
  Administered 2020-09-19: 1 via ORAL
  Filled 2020-09-19: qty 1

## 2020-09-19 MED ORDER — LIDOCAINE-EPINEPHRINE (PF) 2 %-1:200000 IJ SOLN
INTRAMUSCULAR | Status: AC
  Administered 2020-09-19: 20 mL
  Filled 2020-09-19: qty 20

## 2020-09-19 MED ORDER — LIDOCAINE-EPINEPHRINE 2 %-1:100000 IJ SOLN: 20.0000 mL | Freq: Once | INTRAMUSCULAR | Status: DC

## 2020-09-19 MED ORDER — LIDOCAINE-EPINEPHRINE-TETRACAINE (LET) TOPICAL GEL
3.0000 mL | Freq: Once | TOPICAL | Status: AC
  Administered 2020-09-19: 3 mL via TOPICAL
  Filled 2020-09-19: qty 3

## 2020-09-19 MED ORDER — SULFAMETHOXAZOLE-TRIMETHOPRIM 800-160 MG PO TABS: 1.0000 | ORAL_TABLET | Freq: Two times a day (BID) | ORAL | 0 refills | Status: DC

## 2020-09-19 NOTE — ED Provider Notes (Signed)
[facility] Provider Note   CSN: Caldwell Arrival date & time: 09/19/20  0159     History Chief Complaint  Patient presents with  . skin infection    [facility] Caldwell is a 50 y.o. female.  Patient presents to the emergency department for evaluation of a swollen area on her scalp.  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  She recently started shaving her head.  She shaved it for the second time a week ago and since then has been noticing a tender swollen area on the posterior scalp that now is draining.        Past Medical History:  Diagnosis Date  . Abnormal vaginal bleeding   . Blood transfusion   . Cervical pain (neck)    due to abscess   . Diabetes mellitus, type 2 ([place])    changed to type 1  . Generalized headaches   . GERD (gastroesophageal reflux disease)   . Hyperlipidemia   . Hypertension   . Renal failure, chronic, stage 4 (severe) ([place]) 07/07/2020    Patient Active Problem List   Diagnosis Date Noted  . Anxiety state 02/19/2016  . Pap smear for cervical cancer screening 10/16/2015  . Breast lump 09/25/2015  . Incomplete bladder emptying 07/21/2015  . Physical exam 04/29/2014  . 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 Hyperlipemia 04/18/2014  . Myalgia and myositis 03/27/2014  . Acute pharyngitis 03/27/2014  . Otitis media 03/27/2014  . Sinusitis, acute maxillary 03/27/2014  . Yeast infection of the vagina 03/27/2014  . LUQ pain 03/18/2013  . Vomiting and diarrhea 03/18/2013  . Dysuria 11/18/2011  . Dyspnea on exertion 09/07/2011  . Chest pain, atypical 08/27/2011  . Palpitations 08/27/2011  . Sinusitis 08/12/2011  . Hidradenitis 03/25/2011  . HTN (hypertension) 03/14/2011  . Rash 01/25/2011  . Menorrhagia 11/25/2010  . Vaginal yeast infection 11/25/2010  . Dizziness 11/25/2010  . CELLULITIS/ABSCESS NOS 08/18/2010  . Acute sinusitis 06/23/2010  .  DEPRESSIVE DISORDER 05/03/2010  . PANCREATIC DISORDER 05/03/2010  . ASPLENIA 05/03/2010  . ACQUIRED PARTIAL ABSENCE OF PANCREAS 05/03/2010    Past Surgical History:  Procedure Laterality Date  . APPENDECTOMY    . CHOLECYSTECTOMY    . cytocyst removal    . HERNIA REPAIR  August 2000, August [id number]   umbilical hernia   . pancreatic debridgement       OB History    Gravida  4   Para  3   Term  3   Preterm      AB  1   Living  3     SAB  1   IAB      Ectopic      Multiple      Live Births              Family History  Problem Relation Age of Onset  . Hypertension Mother        grandfather  . Diabetes Mother        grandmother  . Stroke Other        grandparent  . Lung cancer Maternal Grandfather     Social History   Tobacco Use  . Smoking status: Never Smoker  . Smokeless tobacco: Never Used  Substance Use Topics  . Alcohol use: No  . Drug use: No    Home Medications Prior to Admission medications   Medication Sig Start Date End Date Taking?  Authorizing Provider  sulfamethoxazole-trimethoprim (BACTRIM DS) 800-160 MG tablet Take 1 tablet by mouth 2 (two) times daily. 09/19/20  Yes Ryker [place], Gwenyth Allegra, MD  citalopram (CELEXA) 20 MG tablet Take 1 tablet (20 mg total) by mouth daily. 02/19/16   Midge Minium, MD  hydrochlorothiazide (HYDRODIURIL) 12.5 MG tablet Take 2 tablets (25 mg total) by mouth daily. 01/03/16   Karim-Rhoades, Dara Lords, CNM  ibuprofen (ADVIL,MOTRIN) 600 MG tablet Take 1 tablet (600 mg total) by mouth every 6 (six) hours as needed. 01/03/16   Karim-Rhoades, Dara Lords, CNM  Insulin Glargine (BASAGLAR KWIKPEN) 100 UNIT/ML SOPN Inject 20 units into the skin at bedtime. 01/01/16   Elayne Snare, MD  insulin lispro (HUMALOG KWIKPEN) 100 UNIT/ML KiwkPen Inject 7 to 11 units into the skin 3 (three) times daily before meals as instructed. 09/25/15   Midge Minium, MD  losartan (COZAAR) 50 MG tablet Take 1 tablet (50 mg total) by  mouth daily. 09/25/15   Midge Minium, MD  methocarbamol (ROBAXIN) 500 MG tablet Take 1 tablet (500 mg total) by mouth 2 (two) times daily. 12/18/13   Fransico Meadow, PA-C  ondansetron (ZOFRAN ODT) 8 MG disintegrating tablet Take 1 tablet (8 mg total) by mouth every 8 (eight) hours as needed for nausea. 03/18/13   Midge Minium, MD  rosuvastatin (CRESTOR) 20 MG tablet Take 1 tablet (20 mg total) by mouth daily. 09/25/15   Midge Minium, MD  mometasone (NASONEX) 50 MCG/ACT nasal spray Place 2 sprays into the nose daily. 01/25/11 08/26/11  Midge Minium, MD    Allergies    Amlodipine and Morphine  Review of Systems   Review of Systems  Skin: Positive for wound.  All other systems reviewed and are negative.   Physical Exam Updated Vital Signs BP (!) 201/84 (BP Location: Left Leg)   Pulse 81   Temp 98.2 F (36.8 C) (Oral)   Resp 18   Ht 5' 4.5" (1.638 m)   Wt 79.8 kg   LMP 09/09/2020   SpO2 100%   BMI 29.74 kg/m   Physical Exam HENT:     Head:     Comments: Alopecia 1cm ulcerated area left occipital scalp, drainage noted Eyes:     Pupils: Pupils are equal, round, and reactive to light.  Musculoskeletal:        General: Normal range of motion.  Lymphadenopathy:     Head:     Right side of head: No preauricular, posterior auricular or occipital adenopathy.     Left side of head: No preauricular, posterior auricular or occipital adenopathy.     Cervical: No cervical adenopathy.  Skin:    Findings: Wound (scalp) present.  Neurological:     Mental Status: She is alert.     Cranial Nerves: Cranial nerves are intact.     Sensory: Sensation is intact.     Motor: Motor function is intact.  Psychiatric:        Mood and Affect: Mood normal.     ED Results / Procedures / Treatments   Labs (all labs ordered are listed, but only abnormal results are displayed) Labs Reviewed  CBG MONITORING, ED - Abnormal; Notable for the following components:      Result  Value   Glucose-Capillary 183 (*)    All other components within normal limits    EKG None  Radiology No results found.  Procedures .Marland KitchenIncision and Drainage  Date/Time: 09/19/2020 3:26 AM Performed by: Orpah Greek,  MD Authorized by: Orpah Greek, MD   Consent:    Consent obtained:  Verbal   Consent given by:  Patient   Risks, benefits, and alternatives were discussed: yes     Risks discussed:  Bleeding, incomplete drainage and pain Location:    Type:  Cyst   Size:  1.5   Location:  Head   Head location:  Scalp Pre-procedure details:    Skin preparation:  Chlorhexidine Sedation:    Sedation type:  None Anesthesia:    Anesthesia method:  Topical application and local infiltration   Topical anesthetic:  LET   Local anesthetic:  Lidocaine 2% WITH epi Procedure type:    Complexity:  Simple Procedure details:    Incision types:  Single straight   Incision depth:  Dermal   Wound management:  Probed and deloculated, irrigated with saline and debrided   Drainage characteristics: green necrotic tissue and what thick sebaceous.   Drainage amount:  Moderate   Wound treatment:  Wound left open Post-procedure details:    Procedure completion:  Tolerated well, no immediate complications     Medications Ordered in ED Medications  sulfamethoxazole-trimethoprim (BACTRIM DS) 800-160 MG per tablet 1 tablet (has no administration in time range)  lidocaine-EPINEPHrine-tetracaine (LET) topical gel (3 mLs Topical Given 09/19/20 0237)  lidocaine-EPINEPHrine (XYLOCAINE W/EPI) 2 %-1:200000 (PF) injection (20 mLs  Given 09/19/20 0239)    ED Course  I have reviewed the triage vital signs and the nursing notes.  Pertinent labs & imaging results that were available during my care of the patient were reviewed by me and considered in my medical decision making (see chart for details).    MDM Rules/Calculators/A&P                          Lesion on posterior scalp  that is partially open and draining green necrotic tissue as well as what appears to be sebaceous material.  Opened more widely, irrigated, probed and will treat for infection.  Final Clinical Impression(s) / ED Diagnoses Final diagnoses:  Sebaceous cyst    Rx / DC Orders ED Discharge Orders         Ordered    sulfamethoxazole-trimethoprim (BACTRIM DS) 800-160 MG tablet  2 times daily        09/19/20 0328           Orpah Greek, MD 09/19/20 0330

## 2020-09-19 NOTE — ED Triage Notes (Signed)
Pt shaved her head about a week ago feels like hair follicle is infected swelling and open wound noted.

## 2021-04-13 DIAGNOSIS — D509 Iron deficiency anemia, unspecified: Secondary | ICD-10-CM | POA: Insufficient documentation

## 2021-04-14 DIAGNOSIS — N2581 Secondary hyperparathyroidism of renal origin: Secondary | ICD-10-CM | POA: Insufficient documentation

## 2021-07-12 DIAGNOSIS — K659 Peritonitis, unspecified: Secondary | ICD-10-CM | POA: Insufficient documentation

## 2021-09-01 DIAGNOSIS — K861 Other chronic pancreatitis: Secondary | ICD-10-CM | POA: Insufficient documentation

## 2021-09-09 ENCOUNTER — Ambulatory Visit: Payer: 59 | Admitting: Psychologist

## 2021-10-21 ENCOUNTER — Ambulatory Visit (INDEPENDENT_AMBULATORY_CARE_PROVIDER_SITE_OTHER): Payer: 59 | Admitting: Psychologist

## 2021-10-21 DIAGNOSIS — F320 Major depressive disorder, single episode, mild: Secondary | ICD-10-CM | POA: Diagnosis not present

## 2021-10-21 NOTE — Plan of Care (Signed)
Goals Begin a healthy grieving process Objectives Tell in detail the story of the current loss that is triggering symptoms Read books on the topic of grief Watch videos on the theme of grief Begin verbalizing feelings associated with the loss Attend a grief support group express thoughts and feelings about the deceased Identify and voice positives about the deceased implement acts of spiritual faith  Interventions create a safe environment and actively build trust use empathy, compassion, and support ask the patient to write a letter to the lost person conduct empty chair ask the patient to discuss and list the positives and negative aspects of the person encourage patient to rely upon his/her spiritual faith  ask client to read books on grief ask patient to watch videos about grief assist patient in identifying emotions  ask patient to attend support group   

## 2021-10-21 NOTE — Progress Notes (Signed)
[facility] Counselor Initial Adult Exam  Name: [facility] Date: 10/21/2021 MRN: [id number] DOB: [birth year] PCP: Pcp, No  Time spent: 1:00 pm to 1:43 pm; total time: 43 minutes  This session was held via video webex teletherapy due to the coronavirus risk at this time. The patient consented to video teletherapy and was located at her home during this session. She is aware it is the responsibility of the patient to secure confidentiality on her end of the session. The provider was in a private home office for the duration of this session. Limits of confidentiality were discussed with the patient.    Guardian/Payee:  NA    Paperwork requested: No   Reason for Visit /Presenting Problem: Grief secondary to husband dying by suicide  Mental Status Exam: Appearance:   Well Groomed     Behavior:  Appropriate  Motor:  Normal  Speech/Language:   Clear and Coherent  Affect:  Appropriate  Mood:  normal  Thought process:  normal  Thought content:    WNL  Sensory/Perceptual disturbances:    WNL  Orientation:  oriented to person, place, and time/date  Attention:  Good  Concentration:  Good  Memory:  WNL  Fund of knowledge:   Good  Insight:    Good  Judgment:   Good  Impulse Control:  Good    Reported Symptoms:  The patient endorsed experiencing the following: feeling down, sad, tearful, social isolation, avoiding pleasurable activities, and rumination of negative thoughts. She denied suicidal and homicidal ideation.   Risk Assessment: Danger to Self:  No Self-injurious Behavior: No Danger to Others: No Duty to Warn:no Physical Aggression / Violence:No  Access to Firearms a concern: No  Gang Involvement:No  Patient / guardian was educated about steps to take if suicide or homicide risk level increases between visits: n/a While future psychiatric events cannot be accurately predicted, the patient does not currently require acute inpatient psychiatric care and does not  currently meet [facility] involuntary commitment criteria.  Substance Abuse History: Current substance abuse: No     Past Psychiatric History:   No previous psychological problems have been observed Outpatient Providers:NA History of Psych Hospitalization: No  Psychological Testing:  NA    Abuse History:  Victim of: No.,  NA    Report needed: No. Victim of Neglect:No. Perpetrator of  NA   Witness / Exposure to Domestic Violence: No   Protective Services Involvement: No  Witness to [employer] Violence:  No   Family History:  Family History  Problem Relation Age of Onset   Hypertension Mother        grandfather   Diabetes Mother        grandmother   Stroke Other        grandparent   Lung cancer Maternal Grandfather     Living situation: the patient lives alone  Sexual Orientation: Straight  Relationship Status: widowed  Name of spouse / other:NA. Patient's husband died by suicide.  If a parent, number of children / ages:Patient has three children. Specifically, she has two sons and one daughter. She has 11 grandchildren.   Support Systems: Family  Financial Stress:  No   Income/Employment/Disability: Employment  Armed forces logistics/support/administrative officer: No   Educational History: Education: Museum/gallery exhibitions officer: NA  Any cultural differences that may affect / interfere with treatment:  not applicable   Recreation/Hobbies: Being with family  Stressors: Other: Grief    Strengths: Supportive Relationships, Family, Hopefulness, Self Advocate, and Able to Communicate  Effectively  Barriers:  NA   Legal History: Pending legal issue / charges: The patient has no significant history of legal issues. History of legal issue / charges:  NA  Medical History/Surgical History: reviewed Past Medical History:  Diagnosis Date   Abnormal vaginal bleeding    Blood transfusion    Cervical pain (neck)    due to abscess    Diabetes mellitus, type 2 ([facility])     changed to type 1   Generalized headaches    GERD (gastroesophageal reflux disease)    Hyperlipidemia    Hypertension    Renal failure, chronic, stage 4 (severe) ([place]) 07/07/2020    Past Surgical History:  Procedure Laterality Date   APPENDECTOMY     CHOLECYSTECTOMY     cytocyst removal     HERNIA REPAIR  August 2000, August 9024   umbilical hernia    pancreatic debridgement      Medications: Current Outpatient Medications  Medication Sig Dispense Refill   citalopram (CELEXA) 20 MG tablet Take 1 tablet (20 mg total) by mouth daily. 30 tablet 3   hydrochlorothiazide (HYDRODIURIL) 12.5 MG tablet Take 2 tablets (25 mg total) by mouth daily. 90 tablet 3   ibuprofen (ADVIL,MOTRIN) 600 MG tablet Take 1 tablet (600 mg total) by mouth every 6 (six) hours as needed. 30 tablet 0   Insulin Glargine (BASAGLAR KWIKPEN) 100 UNIT/ML SOPN Inject 20 units into the skin at bedtime. 10 pen 0   insulin lispro (HUMALOG KWIKPEN) 100 UNIT/ML KiwkPen Inject 7 to 11 units into the skin 3 (three) times daily before meals as instructed. 15 mL 4   losartan (COZAAR) 50 MG tablet Take 1 tablet (50 mg total) by mouth daily. 30 tablet 6   methocarbamol (ROBAXIN) 500 MG tablet Take 1 tablet (500 mg total) by mouth 2 (two) times daily. 20 tablet 0   ondansetron (ZOFRAN ODT) 8 MG disintegrating tablet Take 1 tablet (8 mg total) by mouth every 8 (eight) hours as needed for nausea. 30 tablet 1   rosuvastatin (CRESTOR) 20 MG tablet Take 1 tablet (20 mg total) by mouth daily. 30 tablet 6   sulfamethoxazole-trimethoprim (BACTRIM DS) 800-160 MG tablet Take 1 tablet by mouth 2 (two) times daily. 20 tablet 0   No current facility-administered medications for this visit.    Allergies  Allergen Reactions   Amlodipine Shortness Of Breath and Swelling   Morphine Hives and Itching    Diagnoses:  F32.0 major depressive affective disorder, single episode, mild   Plan of Care: The patient is a 51 year old 57 woman who  was referred for counseling following the death of her husband by suicide. The patient lives alone. The patient meets criteria for a diagnosis of F32.0 major depressive affective disorder, single episode, mild based off of the following: feeling down, sad, tearful, social isolation, avoiding pleasurable activities, and rumination of negative thoughts. She denied suicidal and homicidal ideation.   The patient stated that she wanted to process the grief.  This psychologist makes the recommendation that the patient participate in therapy at least once a month.    Conception Chancy, PsyD

## 2021-10-21 NOTE — Progress Notes (Signed)
                Berthel Bagnall, PsyD 

## 2021-11-04 DIAGNOSIS — N186 End stage renal disease: Secondary | ICD-10-CM | POA: Insufficient documentation

## 2021-11-22 ENCOUNTER — Other Ambulatory Visit: Payer: Self-pay | Admitting: *Deleted

## 2021-11-22 DIAGNOSIS — N186 End stage renal disease: Secondary | ICD-10-CM

## 2021-11-25 ENCOUNTER — Ambulatory Visit (INDEPENDENT_AMBULATORY_CARE_PROVIDER_SITE_OTHER): Payer: 59 | Admitting: Psychologist

## 2021-11-25 DIAGNOSIS — F320 Major depressive disorder, single episode, mild: Secondary | ICD-10-CM | POA: Diagnosis not present

## 2021-11-25 NOTE — Progress Notes (Signed)
                Tamina Cyphers, PsyD 

## 2021-11-25 NOTE — Progress Notes (Signed)
[facility] Counselor/Therapist Progress Note  Patient ID: Makalynn Berwanger, MRN: [id number],    Date: 11/25/2021  Time Spent: 3:05 pm to 3:45 pm; total time: 40 minutes   This session was held via video webex teletherapy due to the coronavirus risk at this time. The patient consented to video teletherapy and was located at her home during this session. She is aware it is the responsibility of the patient to secure confidentiality on her end of the session. The provider was in a private home office for the duration of this session. Limits of confidentiality were discussed with the patient.   Treatment Type: Individual Therapy  Reported Symptoms: Depression  Mental Status Exam: Appearance:  Well Groomed     Behavior: Appropriate  Motor: Normal  Speech/Language:  Normal Rate  Affect: Appropriate  Mood: normal  Thought process: normal  Thought content:   WNL  Sensory/Perceptual disturbances:   WNL  Orientation: oriented to person, place, and time/date  Attention: Good  Concentration: Good  Memory: WNL  Fund of knowledge:  Good  Insight:   Good  Judgment:  Good  Impulse Control: Good   Risk Assessment: Danger to Self:  No Self-injurious Behavior: No Danger to Others: No Duty to Warn:no Physical Aggression / Violence:No  Access to Firearms a concern: No  Gang Involvement:No   Subjective: Beginning the session, patient described herself as okay. After reviewing the treatment plan, she reflected on medical challenges she has experienced since the intake. As part of this, she processed emotions and thoughts related to frustrations she has experienced as a result of some of the medical care she has received. She then spent time reflecting and processing frustrations she experiences as she works on adapting to living on dialysis and how it inhibits her quality of life. She talked about limitations she has due to dialysis. She ended the session processing the idea of purpose  and value while being on dialysis. She was agreeable to homework and following up. She denied suicidal and homicidal ideation.    Interventions:  Worked on developing a therapeutic relationship with the patient using active listening and reflective statements. Provided emotional support using empathy and validation. Reviewed the treatment plan with the patient. Reviewed events since the intake. Normalized and validated expressed thoughts and emotions. Processed emotions and frustrations related to medical care received. Used socratic questions to assist the patient gain insight into self. Assisted in problem solving. Identified themes associated of being on dialysis. Processed the different themes identified including adapting to a new norm after being on dialysis. Provided psychoeducation about [employer]. Explored the idea of participating in a support group for other young adults who are going through dialysis. Began to explore what bring purpose to the patient's life. Provided empathic statements. Assigned homework. Assessed for suicidal and homicidal ideation.    Homework: Look into support groups, possibly listen to podcast  Next Session: Review homework and process finding purpose  Diagnosis: F32.0 major depressive affective disorder, single episode, mild   Plan:   Goals Work through the grieving process and face reality of own death Accept emotional support from others around them Live life to the fullest, event though time may be limited Become as knowledgeable about the medical condition  Reduce fear, anxiety about the health condition  Accept the illness Accept the role of psychological and behavioral factors  Stabilize anxiety level wile increasing ability to function Learn and implement coping skills that result in a reduction of anxiety  Alleviate depressive  symptoms Recognize, accept, and cope with depressive feelings Develop healthy thinking patterns Develop healthy  interpersonal relationships  Objectives target date for all objectives is 10/22/2022.  Identify feelings associated with the illness Family members share with each other feelings Identify the losses or limitations that have been experienced Verbalize acceptance of the reality of the medical condition Commit to learning and implement a proactive approach to managing personal stresses Verbalize an understanding of the medical condition Work with therapist to develop a plan for coping with stress Learn and implement skills for managing stress Engage in social, productive activities that are possible Engage in faith based activities implement positive imagery Identify coping skills and sources of emotional support Patient's partner and family members verbalize their fears regarding severity of health condition Identify sources of emotional distress  Learning and implement calming skills to reduce overall anxiety Learn and implement problem solving strategies Identify and engage in pleasant activities Learning and implement personal and interpersonal skills to reduce anxiety and improve interpersonal relationships Learn to accept limitations in life and commit to tolerating, rather than avoiding, unpleasant emotions while accomplishing meaningful goals Identify major life conflicts from the past and present that form the basis for present anxiety Learn and implement behavioral strategies Verbalize an understanding and resolution of current interpersonal problems Learn and implement problem solving and decision making skills Learn and implement conflict resolution skills to resolve interpersonal problems Verbalize an understanding of healthy and unhealthy emotions verbalize insight into how past relationships may be influence current experiences with depression Use mindfulness and acceptance strategies and increase value based behavior  Increase hopeful statements about the future.    Interventions Teach about stress and ways to handle stress Assist the patient in developing a coping action plan for stressors Conduct skills based training for coping strategies Train problem focused skills Sort out what activities the individual can do Encourage patient to rely upon his/her spiritual faith Teach the patient to use guided imagery Probe and evaluate family's ability to provide emotional support Allow family to share their fears Assist the patient in identifying, sorting through, and verbalizing the various feelings generated by his/her medical condition Meet with family members  Ask patient list out limitations  Use stress inoculation training  Use Acceptance and Commitment Therapy to help client accept uncomfortable realities in order to accomplish value-consistent goals Reinforce the client's insight into the role of his/her past emotional pain and present anxiety  Discuss examples demonstrating that unrealistic worry overestimates the probability of threats and underestimate patient's ability  Assist the patient in analyzing his or her worries Help patient understand that avoidance is reinforcing  Behavioral activation help the client explore the relationship, nature of the dispute,  Help the client develop new interpersonal skills and relationships Conduct Problem so living therapy Teach conflict resolution skills Use a process-experiential approach Conduct TLDP Conduct ACT  The patient and clinician reviewed the treatment plan on 11/25/2021. The patient approved of the treatment plan.   Conception Chancy, PsyD

## 2021-12-01 ENCOUNTER — Encounter (HOSPITAL_COMMUNITY): Payer: Managed Care, Other (non HMO)

## 2021-12-01 ENCOUNTER — Encounter: Payer: Managed Care, Other (non HMO) | Admitting: Vascular Surgery

## 2021-12-01 ENCOUNTER — Other Ambulatory Visit (HOSPITAL_COMMUNITY): Payer: Managed Care, Other (non HMO)

## 2021-12-06 DIAGNOSIS — D631 Anemia in chronic kidney disease: Secondary | ICD-10-CM | POA: Insufficient documentation

## 2021-12-06 DIAGNOSIS — I129 Hypertensive chronic kidney disease with stage 1 through stage 4 chronic kidney disease, or unspecified chronic kidney disease: Secondary | ICD-10-CM | POA: Insufficient documentation

## 2021-12-06 DIAGNOSIS — T782XXA Anaphylactic shock, unspecified, initial encounter: Secondary | ICD-10-CM | POA: Insufficient documentation

## 2021-12-06 DIAGNOSIS — Z992 Dependence on renal dialysis: Secondary | ICD-10-CM | POA: Insufficient documentation

## 2021-12-06 DIAGNOSIS — T7840XA Allergy, unspecified, initial encounter: Secondary | ICD-10-CM | POA: Insufficient documentation

## 2021-12-06 DIAGNOSIS — E1065 Type 1 diabetes mellitus with hyperglycemia: Secondary | ICD-10-CM | POA: Insufficient documentation

## 2021-12-23 ENCOUNTER — Ambulatory Visit: Payer: 59 | Admitting: Psychologist

## 2022-01-31 DIAGNOSIS — I700 Atherosclerosis of aorta: Secondary | ICD-10-CM | POA: Insufficient documentation

## 2022-02-01 ENCOUNTER — Other Ambulatory Visit: Payer: Self-pay | Admitting: *Deleted

## 2022-02-01 DIAGNOSIS — N186 End stage renal disease: Secondary | ICD-10-CM

## 2022-02-08 DIAGNOSIS — D689 Coagulation defect, unspecified: Secondary | ICD-10-CM | POA: Insufficient documentation

## 2022-02-08 DIAGNOSIS — I953 Hypotension of hemodialysis: Secondary | ICD-10-CM | POA: Insufficient documentation

## 2022-02-10 NOTE — Progress Notes (Signed)
Office Note     CC:  [facility] Requesting Provider:  Elmarie Shiley, MD  HPI: Shannon Caldwell is a Right handed 51 y.o. (March 14, 1971) female with kidney disease who presents at the request of Elmarie Shiley, MD for permanent HD access.  Prior access procedures includes PD catheter, pulled 3 months ago due to peritonitis.  Currently being dialyzed through a right-sided tunneled IJ line.  Surgical history also includes midline laparotomy status post ERCP complication resulting in partial pancreatectomy, cholecystectomy.  On exam today, Shannon Caldwell was doing well.  She is hoping to continue peritoneal dialysis catheter.  She was not interested in long-term hemodialysis.  She is currently on the transplant list at [facility]   Past Medical History:  Diagnosis Date   Abnormal vaginal bleeding    Blood transfusion    Cervical pain (neck)    due to abscess    Diabetes mellitus, type 2 (HCC)    changed to type 1   Generalized headaches    GERD (gastroesophageal reflux disease)    Hyperlipidemia    Hypertension    Renal failure, chronic, stage 4 (severe) ([place]) 07/07/2020    Past Surgical History:  Procedure Laterality Date   APPENDECTOMY     CHOLECYSTECTOMY     cytocyst removal     HERNIA REPAIR  August 2000, August 2694   umbilical hernia    pancreatic debridgement      Social History   Socioeconomic History   Marital status: Single    Spouse name: Not on file   Number of children: 3   Years of education: Not on file   Highest education level: Not on file  Occupational History   Occupation: nursing student    Employer: [employer]   Occupation: med Designer, multimedia    Comment: [employer] on weekends   Occupation: [employer]    Comment: during week  Tobacco Use   Smoking status: Never   Smokeless tobacco: Never  Substance and Sexual Activity   Alcohol use: No   Drug use: No   Sexual activity: Not on file  Other Topics Concern   Not on file  Social History Narrative   Daughter  44, son 40, son 67   Grandson lives with Patient   Social Determinants of Radio broadcast assistant Strain: Not on file  Food Insecurity: Not on file  Transportation Needs: Not on file  Physical Activity: Not on file  Stress: Not on file  Social Connections: Not on file  Intimate Partner Violence: Not on file   Family History  Problem Relation Age of Onset   Hypertension Mother        grandfather   Diabetes Mother        grandmother   Stroke Other        grandparent   Lung cancer Maternal Grandfather     Current Outpatient Medications  Medication Sig Dispense Refill   citalopram (CELEXA) 20 MG tablet Take 1 tablet (20 mg total) by mouth daily. 30 tablet 3   hydrochlorothiazide (HYDRODIURIL) 12.5 MG tablet Take 2 tablets (25 mg total) by mouth daily. 90 tablet 3   ibuprofen (ADVIL,MOTRIN) 600 MG tablet Take 1 tablet (600 mg total) by mouth every 6 (six) hours as needed. 30 tablet 0   Insulin Glargine (BASAGLAR KWIKPEN) 100 UNIT/ML SOPN Inject 20 units into the skin at bedtime. 10 pen 0   insulin lispro (HUMALOG KWIKPEN) 100 UNIT/ML KiwkPen Inject 7 to 11 units into the skin 3 (three)  times daily before meals as instructed. 15 mL 4   losartan (COZAAR) 50 MG tablet Take 1 tablet (50 mg total) by mouth daily. 30 tablet 6   methocarbamol (ROBAXIN) 500 MG tablet Take 1 tablet (500 mg total) by mouth 2 (two) times daily. 20 tablet 0   ondansetron (ZOFRAN ODT) 8 MG disintegrating tablet Take 1 tablet (8 mg total) by mouth every 8 (eight) hours as needed for nausea. 30 tablet 1   rosuvastatin (CRESTOR) 20 MG tablet Take 1 tablet (20 mg total) by mouth daily. 30 tablet 6   sulfamethoxazole-trimethoprim (BACTRIM DS) 800-160 MG tablet Take 1 tablet by mouth 2 (two) times daily. 20 tablet 0   No current facility-administered medications for this visit.    Allergies  Allergen Reactions   Amlodipine Shortness Of Breath and Swelling   Morphine Hives and Itching     REVIEW OF  SYSTEMS:   [X]  denotes positive finding, [ ]  denotes negative finding Cardiac  Comments:  Chest pain or chest pressure:    Shortness of breath upon exertion:    Short of breath when lying flat:    Irregular heart rhythm:        Vascular    Pain in calf, thigh, or hip brought on by ambulation:    Pain in feet at night that wakes you up from your sleep:     Blood clot in your veins:    Leg swelling:         Pulmonary    Oxygen at home:    Productive cough:     Wheezing:         Neurologic    Sudden weakness in arms or legs:     Sudden numbness in arms or legs:     Sudden onset of difficulty speaking or slurred speech:    Temporary loss of vision in one eye:     Problems with dizziness:         Gastrointestinal    Blood in stool:     Vomited blood:         Genitourinary    Burning when urinating:     Blood in urine:        Psychiatric    Major depression:         Hematologic    Bleeding problems:    Problems with blood clotting too easily:        Skin    Rashes or ulcers:        Constitutional    Fever or chills:      PHYSICAL EXAMINATION:  There were no vitals filed for this visit.  General:  WDWN in NAD; vital signs documented above Gait: Not observed HENT: WNL, normocephalic Pulmonary: normal non-labored breathing , without Rales, rhonchi,  wheezing Cardiac: regular HR, without   Abdomen: soft, NT, no masses Skin: without rashes Vascular Exam/Pulses:  Right Left  Radial 2+ (normal) 2+ (normal)                       Extremities: without ischemic changes, without Gangrene , without cellulitis; without open wounds;  Musculoskeletal: no muscle wasting or atrophy  Neurologic: A&O X 3;  No focal weakness or paresthesias are detected Psychiatric:  The pt has Normal affect.   Non-Invasive Vascular Imaging:   +-----------------+-------------+----------+--------------+  Right Cephalic   Diameter (cm)Depth (cm)   Findings      +-----------------+-------------+----------+--------------+  Shoulder  0.18                               +-----------------+-------------+----------+--------------+  Prox upper arm       0.16                               +-----------------+-------------+----------+--------------+  Mid upper arm        0.11                               +-----------------+-------------+----------+--------------+  Dist upper arm       0.13                               +-----------------+-------------+----------+--------------+  Antecubital fossa    0.26                               +-----------------+-------------+----------+--------------+  Prox forearm         0.16               not visualized  +-----------------+-------------+----------+--------------+  Dist forearm         0.07                               +-----------------+-------------+----------+--------------+   +-----------------+-------------+----------+--------------+  Right Basilic    Diameter (cm)Depth (cm)   Findings     +-----------------+-------------+----------+--------------+  Prox upper arm                          not visualized  +-----------------+-------------+----------+--------------+  Mid upper arm     0.18 / 0.11                           +-----------------+-------------+----------+--------------+  Dist upper arm       0.09                               +-----------------+-------------+----------+--------------+  Antecubital fossa    0.11                               +-----------------+-------------+----------+--------------+  Prox forearm                            not visualized  +-----------------+-------------+----------+--------------+  Mid forearm          0.09                               +-----------------+-------------+----------+--------------+  Distal forearm       0.05                                +-----------------+-------------+----------+--------------+   +-----------------+-------------+----------+--------------+  Left Cephalic    Diameter (cm)Depth (cm)   Findings     +-----------------+-------------+----------+--------------+  Shoulder             0.15                               +-----------------+-------------+----------+--------------+  Prox upper arm       0.20                               +-----------------+-------------+----------+--------------+  Mid upper arm     0.28 / 0.26           mid to distal   +-----------------+-------------+----------+--------------+  Dist upper arm       0.14               not visualized  +-----------------+-------------+----------+--------------+  Antecubital fossa    0.17                               +-----------------+-------------+----------+--------------+  Prox forearm         0.12                               +-----------------+-------------+----------+--------------+  Mid forearm          0.09                               +-----------------+-------------+----------+--------------+  Dist forearm         0.08                               +-----------------+-------------+----------+--------------+   +-----------------+-------------+----------+----------------------------+  Left Basilic     Diameter (cm)Depth (cm)          Findings            +-----------------+-------------+----------+----------------------------+  Shoulder                                       not visualized         +-----------------+-------------+----------+----------------------------+  Prox upper arm                                 not visualized         +-----------------+-------------+----------+----------------------------+  Mid upper arm                                  not visualized         +-----------------+-------------+----------+----------------------------+  Dist upper arm                           not visualized and too small  +-----------------+-------------+----------+----------------------------+  Antecubital fossa    0.11                                             +-----------------+-------------+----------+----------------------------+  Prox forearm                                   not visualized         +-----------------+-------------+----------+----------------------------+   Summary:     Measurements above.      NOTE: Technically difficult  exam due to small veins.   *See table(s) above for measurements and observations.     Diagnosing physician: Orlie Pollen     ASSESSMENT/PLAN:  Shannon Caldwell is a 51 y.o. female who presents with end stage renal disease currently being dialyzed through a right-sided tunneled HD catheter in need of long-term access.  I had a long discussion with her regarding hemodialysis versus peritoneal dialysis.  Vein mapping was reviewed demonstrating small superficial veins bilaterally.  Shannon Caldwell would require an AV graft.  She is not interested in hemodialysis at this time, and asked for peritoneal dialysis catheter placement.  I discussed her case with my partner Shannon Caldwell who does the majority of our peritoneal dialysis cases.  With her previous abdominal surgeries, including a midline laparotomy, she is not a candidate for this procedure.  I offered her a second opinion at [facility] asked for referral and stated she would call our office back should she decide to pursue hemodialysis.   [name], MD [facility] [phone]

## 2022-02-11 ENCOUNTER — Ambulatory Visit (INDEPENDENT_AMBULATORY_CARE_PROVIDER_SITE_OTHER)
Admission: RE | Admit: 2022-02-11 | Discharge: 2022-02-11 | Disposition: A | Discharge status: Home / Self Care | Payer: 59 | Source: Ambulatory Visit | Attending: Vascular Surgery | Admitting: Vascular Surgery

## 2022-02-11 ENCOUNTER — Ambulatory Visit (HOSPITAL_COMMUNITY)
Admission: RE | Admit: 2022-02-11 | Discharge: 2022-02-11 | Disposition: A | Discharge status: Home / Self Care | Payer: 59 | Source: Ambulatory Visit | Attending: Vascular Surgery | Admitting: Vascular Surgery

## 2022-02-11 ENCOUNTER — Ambulatory Visit: Payer: 59 | Admitting: Psychologist

## 2022-02-11 ENCOUNTER — Ambulatory Visit (INDEPENDENT_AMBULATORY_CARE_PROVIDER_SITE_OTHER): Payer: 59 | Admitting: Vascular Surgery

## 2022-02-11 ENCOUNTER — Encounter: Payer: Self-pay | Admitting: Vascular Surgery

## 2022-02-11 VITALS — BP 171/72 | HR 60 | Temp 98.0°F | Resp 20 | Ht 64.5 in | Wt 151.0 lb

## 2022-02-11 DIAGNOSIS — Z992 Dependence on renal dialysis: Secondary | ICD-10-CM | POA: Diagnosis not present

## 2022-02-11 DIAGNOSIS — N186 End stage renal disease: Secondary | ICD-10-CM | POA: Insufficient documentation

## 2022-02-16 DIAGNOSIS — N186 End stage renal disease: Secondary | ICD-10-CM | POA: Insufficient documentation

## 2022-02-16 DIAGNOSIS — E083599 Diabetes mellitus due to underlying condition with proliferative diabetic retinopathy without macular edema, unspecified eye: Secondary | ICD-10-CM | POA: Insufficient documentation

## 2022-03-04 ENCOUNTER — Ambulatory Visit (INDEPENDENT_AMBULATORY_CARE_PROVIDER_SITE_OTHER): Payer: 59 | Admitting: Psychologist

## 2022-03-04 DIAGNOSIS — F320 Major depressive disorder, single episode, mild: Secondary | ICD-10-CM

## 2022-03-04 NOTE — Progress Notes (Signed)
[facility] Counselor/Therapist Progress Note  Patient ID: Geneviene Tesch, MRN: [id number],    Date: 03/04/2022  Time Spent: 02:01 pm to 02:19 pm; total time: 18 minutes   This session was held via video webex teletherapy due to the coronavirus risk at this time. The patient consented to video teletherapy and was located at her home during this session. She is aware it is the responsibility of the patient to secure confidentiality on her end of the session. The provider was in a private home office for the duration of this session. Limits of confidentiality were discussed with the patient.   Treatment Type: Individual Therapy  Reported Symptoms: Denied symptoms   Mental Status Exam: Appearance:  Well Groomed     Behavior: Appropriate  Motor: Normal  Speech/Language:  Normal Rate  Affect: Appropriate  Mood: normal  Thought process: normal  Thought content:   WNL  Sensory/Perceptual disturbances:   WNL  Orientation: oriented to person, place, and time/date  Attention: Good  Concentration: Good  Memory: WNL  Fund of knowledge:  Good  Insight:   Good  Judgment:  Good  Impulse Control: Good   Risk Assessment: Danger to Self:  No Self-injurious Behavior: No Danger to Others: No Duty to Warn:no Physical Aggression / Violence:No  Access to Firearms a concern: No  Gang Involvement:No   Subjective: Beginning the session, patient described herself as doing well despite experiencing some challenging situations. She voiced that she feels as though she has learned how to cope with those situations. She identified warning signs that would indicate that she needs counseling. She terminated therapy. She denied suicidal and homicidal ideation.    Interventions:  Worked on developing a therapeutic relationship with the patient using active listening and reflective statements. Provided emotional support using empathy and validation. Reviewed the treatment plan with the patient.  Used summary and reflective statements. Praised the patient for doing better and explored what has assisted the patient. Normalized and validated expressed thoughts and emotions. Briefly reviewed whether or not patient has addressed concerns related to grief. Reflected on growth in counseling. Identified warning signs indicating that counseling would be beneficial. Processed thoughts and emotions. Assessed for suicidal and homicidal ideation.    Homework: NA  Next Session: NA  Diagnosis: F32.0 major depressive affective disorder, single episode, mild   Plan:   Goals Work through the grieving process and face reality of own death Accept emotional support from others around them Live life to the fullest, event though time may be limited Become as knowledgeable about the medical condition  Reduce fear, anxiety about the health condition  Accept the illness Accept the role of psychological and behavioral factors  Stabilize anxiety level wile increasing ability to function Learn and implement coping skills that result in a reduction of anxiety  Alleviate depressive symptoms Recognize, accept, and cope with depressive feelings Develop healthy thinking patterns Develop healthy interpersonal relationships  Objectives target date for all objectives is 10/22/2022.  Identify feelings associated with the illness Family members share with each other feelings Identify the losses or limitations that have been experienced Verbalize acceptance of the reality of the medical condition Commit to learning and implement a proactive approach to managing personal stresses Verbalize an understanding of the medical condition Work with therapist to develop a plan for coping with stress Learn and implement skills for managing stress Engage in social, productive activities that are possible Engage in faith based activities implement positive imagery Identify coping skills and sources of emotional  support Patient's partner and family members verbalize their fears regarding severity of health condition Identify sources of emotional distress  Learning and implement calming skills to reduce overall anxiety Learn and implement problem solving strategies Identify and engage in pleasant activities Learning and implement personal and interpersonal skills to reduce anxiety and improve interpersonal relationships Learn to accept limitations in life and commit to tolerating, rather than avoiding, unpleasant emotions while accomplishing meaningful goals Identify major life conflicts from the past and present that form the basis for present anxiety Learn and implement behavioral strategies Verbalize an understanding and resolution of current interpersonal problems Learn and implement problem solving and decision making skills Learn and implement conflict resolution skills to resolve interpersonal problems Verbalize an understanding of healthy and unhealthy emotions verbalize insight into how past relationships may be influence current experiences with depression Use mindfulness and acceptance strategies and increase value based behavior  Increase hopeful statements about the future.   Interventions Teach about stress and ways to handle stress Assist the patient in developing a coping action plan for stressors Conduct skills based training for coping strategies Train problem focused skills Sort out what activities the individual can do Encourage patient to rely upon his/her spiritual faith Teach the patient to use guided imagery Probe and evaluate family's ability to provide emotional support Allow family to share their fears Assist the patient in identifying, sorting through, and verbalizing the various feelings generated by his/her medical condition Meet with family members  Ask patient list out limitations  Use stress inoculation training  Use Acceptance and Commitment Therapy to help  client accept uncomfortable realities in order to accomplish value-consistent goals Reinforce the client's insight into the role of his/her past emotional pain and present anxiety  Discuss examples demonstrating that unrealistic worry overestimates the probability of threats and underestimate patient's ability  Assist the patient in analyzing his or her worries Help patient understand that avoidance is reinforcing  Behavioral activation help the client explore the relationship, nature of the dispute,  Help the client develop new interpersonal skills and relationships Conduct Problem so living therapy Teach conflict resolution skills Use a process-experiential approach Conduct TLDP Conduct ACT  The patient and clinician reviewed the treatment plan on 11/25/2021. The patient approved of the treatment plan.   Conception Chancy, PsyD

## 2022-03-11 ENCOUNTER — Telehealth: Payer: Self-pay

## 2022-03-11 NOTE — Telephone Encounter (Signed)
Pt called stating that she was ready for the AVG that was discussed previously. She requested a call from Dr Virl Cagey because she had additional questions.  Staff msg sent to Dr Virl Cagey.

## 2022-03-23 ENCOUNTER — Telehealth (HOSPITAL_COMMUNITY): Payer: Self-pay | Admitting: *Deleted

## 2022-03-23 NOTE — Telephone Encounter (Signed)
Received fax from [facility]. Patient is now agreeable to AVG placement.  Will give to [facility].

## 2022-03-30 ENCOUNTER — Other Ambulatory Visit: Payer: Self-pay | Admitting: *Deleted

## 2022-03-30 DIAGNOSIS — N186 End stage renal disease: Secondary | ICD-10-CM

## 2022-04-15 ENCOUNTER — Encounter (HOSPITAL_COMMUNITY): Payer: Self-pay | Admitting: Vascular Surgery

## 2022-04-15 NOTE — Progress Notes (Signed)
Anesthesia Chart Review: Same-day work-up  Patient had recent cardiac testing as part of renal transplant evaluation at [facility], results copied below.  She was subsequently seen by cardiologist Dr. Leia Alf back 03/28/2022 to review.  Per note, " Doing well from a cardiac standpoint. Hypertension, chronic illness. Diabetes managed by PCP. Chronic Kidney disease on HD managed by Nephrology, transplant evaluation. Her blood pressure is doing well now. She has no evidence of active cardiac issue, we will have her follow-up in 1 year sooner transfer evaluation is normal. I did look at her nuclear images and I believe they look normal on first glance. Hospital records reviewed. Recent cardiac tests reviewed and summarized. Recent labs reviewed and discussed. External tests reviewed. Prescription drug management: Cardiac meds reviewed; Continue Current medical therapy. Continued/increased exercise recommended. Call if symptoms worsen."  She was advised to follow-up with cardiology in 1 year.  History of restrictive lung disease.  PFTs done 03/21/2022 for pretransplant eval: EFFORT: Adequate effort, results reproducible.   SPIROMETRY: No obstruction present. Reduced FEV1 and FVC concerning for restriction, as confirmed by lung volumes.   FLOW VOLUME LOOPS: Suggests restriction.   LUNG VOLUMES: TLC reduced, RV/TLC elevated suggesting complex restriction. DIFFUSION CAPACITY: Reduced DLCO with normal VA. May be seen with pulmonary hypertension, pulmonary embolism, vasculitis, emphysema with preserved lung volumes, or anemia.   CONCLUSION: Mild complex restriction. Moderate reduction in uncorrected diffusing capacity.   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 6 month hospitalization, TPN, DHT. Developed DM following this hospitalization. Poor control of DM for many years following pancreatectomy  per PCP notes.  She was previously on insulin, however is no longer any antidiabetic medications.  Rarely checks blood sugar.  Last A1c in Care Everywhere was 5.7 on 12/07/2021.  ESRD on HD previously on peritoneal dialysis catheter removed due to peritonitis.  Currently dialyzing through right sided tunneled IJ line.  Patient will need day of surgery labs and evaluation.  EKG 02/16/2022 (Care Everywhere): Sinus rhythm.  Rate 61. Left anterior fascicular block. LVH with secondary repolarization abnormality ( R in aVL , Cornell product , Romhilt-Estes )   Stress test 03/28/22 (care everywhere, pre-transplant eval): 1.) LIMITATIONS: None.  2.) MYOCARDIAL PERFUSION: Normal.  3.) LEFT VENTRICULAR EJECTION FRACTION: Normal.  4.) REGIONAL WALL MOTION: Normal.   TTE 03/21/22 (Care everywhere, pre-transplant eval): SUMMARY  The left ventricular size is normal.  Severe left ventricular hypertrophy.  Basal septum measures 1.6 cm.  Left ventricular systolic function is normal.  LV ejection fraction = 55-60%.  The left ventricular wall motion is normal.  LV Global L Strain =-11.6%.  Left ventricular filling pattern is prolonged relaxation.  Apex appears hypercontractile compared to basal segments.  The right ventricle is normal in size and function.  The left atrium is moderately to severely dilated.  The right atrium is mildly dilated.  The IVC is normal in size with an inspiratory collapse of greater then  50%, suggesting normal right atrial pressure.  There is no pericardial effusion.  Compared to prior study on 11/05/21, there is more evidence for HCM  phenocopy such as infiltrative cardiomyopathy, including cardiac  amyloidosis, or HCM.  Clinical correlation and further evaluation advised     Wynonia Musty [facility]/Anesthesiology Phone [phone] 04/15/2022 11:15 AM

## 2022-04-15 NOTE — Anesthesia Preprocedure Evaluation (Signed)
Anesthesia Evaluation  Patient identified by MRN, date of birth, ID band Patient awake    Reviewed: Allergy & Precautions, NPO status , Patient's Chart, lab work & pertinent test results  History of Anesthesia Complications Negative for: history of anesthetic complications  Airway Mallampati: II  TM Distance: >3 FB Neck ROM: Full    Dental  (+) Teeth Intact, Dental Advisory Given   Pulmonary  Restrictive lung disease   Pulmonary exam normal        Cardiovascular hypertension, Pt. on medications and Pt. on home beta blockers Normal cardiovascular exam  Normal stress test 03/28/22  TTE 03/21/22: EF 55-60%, severe LVH, normal RVSF; Compared to prior study on 11/05/21, there is more evidence for HCM phenocopy such as infiltrative cardiomyopathy, including cardiac amyloidosis, or HCM.     Neuro/Psych negative neurological ROS     GI/Hepatic Neg liver ROS,GERD  ,,H/o pancreatitis with pseudocyst and necrosis   Endo/Other  diabetes    Renal/GU ESRF and DialysisRenal disease  negative genitourinary   Musculoskeletal negative musculoskeletal ROS (+)    Abdominal   Peds  Hematology negative hematology ROS (+)   Anesthesia Other Findings   Reproductive/Obstetrics                             Anesthesia Physical Anesthesia Plan  ASA: 3  Anesthesia Plan: MAC and Regional   Post-op Pain Management: Regional block* and Tylenol PO (pre-op)*   Induction: Intravenous  PONV Risk Score and Plan: 2 and Propofol infusion, TIVA and Treatment may vary due to age or medical condition  Airway Management Planned: Natural Airway, Nasal Cannula and Simple Face Mask  Additional Equipment: None  Intra-op Plan:   Post-operative Plan:   Informed Consent: I have reviewed the patients History and Physical, chart, labs and discussed the procedure including the risks, benefits and alternatives for the proposed  anesthesia with the patient or authorized representative who has indicated his/her understanding and acceptance.       Plan Discussed with:   Anesthesia Plan Comments: (PAT note by Karoline Caldwell, PA-C: Patient had recent cardiac testing as part of renal transplant evaluation at [facility], results copied below.  She was subsequently seen by cardiologist Dr. Leia Alf back 03/28/2022 to review.  Per note, " Doing well from a cardiac standpoint. Hypertension, chronic illness. Diabetes managed by PCP. Chronic Kidney disease on HD managed by Nephrology, transplant evaluation. Her blood pressure is doing well now. She has no evidence of active cardiac issue, we will have her follow-up in 1 year sooner transfer evaluation is normal. I did look at her nuclear images and I believe they look normal on first glance. Hospital records reviewed. Recent cardiac tests reviewed and summarized. Recent labs reviewed and discussed. External tests reviewed. Prescription drug management: Cardiac meds reviewed; Continue Current medical therapy. Continued/increased exercise recommended. Call if symptoms worsen."  She was advised to follow-up with cardiology in 1 year.  History of restrictive lung disease.  PFTs done 03/21/2022 for pretransplant eval: EFFORT: Adequate effort, results reproducible.  SPIROMETRY: No obstruction present. Reduced FEV1 and FVC concerning for restriction, as confirmed by lung volumes.  FLOW VOLUME LOOPS: Suggests restriction.  LUNG VOLUMES: TLC reduced, RV/TLC elevated suggesting complex restriction. DIFFUSION CAPACITY: Reduced DLCO with normal VA. May be seen with pulmonary hypertension, pulmonary embolism, vasculitis, emphysema with preserved lung volumes, or anemia.  CONCLUSION: Mild complex restriction. Moderate reduction in uncorrected diffusing capacity.   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 6 month hospitalization, TPN, DHT. Developed DM following this hospitalization. Poor control of DM for many years following pancreatectomy per PCP notes.  She was previously on insulin, however is no longer any antidiabetic medications.  Rarely checks blood sugar.  Last A1c in Care Everywhere was 5.7 on 12/07/2021.  ESRD on HD previously on peritoneal dialysis catheter removed due to peritonitis.  Currently dialyzing through right sided tunneled IJ line.  Patient will need day of surgery labs and evaluation.  EKG 02/16/2022 (Care Everywhere): Sinus rhythm.  Rate 61. Left anterior fascicular block. LVH with secondary repolarization abnormality ( R in aVL , Cornell product , Romhilt-Estes )   Stress test 03/28/22 (care everywhere, pre-transplant eval): 1.) LIMITATIONS: None.  2.) MYOCARDIAL PERFUSION: Normal.  3.) LEFT VENTRICULAR EJECTION FRACTION: Normal.  4.) REGIONAL WALL MOTION: Normal.   TTE 03/21/22 (Care everywhere, pre-transplant eval): SUMMARY  The left ventricular size is normal.  Severe left ventricular hypertrophy.  Basal septum measures 1.6 cm.  Left ventricular systolic function is normal.  LV ejection fraction = 55-60%.  The left ventricular wall motion is normal.  LV Global L Strain =-11.6%.  Left ventricular filling pattern is prolonged relaxation.  Apex appears hypercontractile compared to basal segments.  The right ventricle is normal in size and function.  The left atrium is moderately to severely dilated.  The right atrium is mildly dilated.  The IVC is normal in size with an inspiratory collapse of greater then  50%, suggesting normal right atrial pressure.  There is no pericardial effusion.  Compared to prior study on 11/05/21, there is more evidence for HCM  phenocopy such as infiltrative cardiomyopathy, including cardiac  amyloidosis, or HCM.  Clinical correlation and further evaluation advised   )        Anesthesia Quick  Evaluation

## 2022-04-15 NOTE — Progress Notes (Signed)
COVID Vaccine Completed: yes  Date of COVID positive in last 90 days: no  PCP - Madelon Lips, MD Cardiologist - Clarene Critchley, MD  Chest x-ray - [facility] EKG - 03/21/22 [facility], requested via fax Stress Test - 03/28/22 [facility]  ECHO - 03/21/22 [facility] Cardiac Cath - 2022 [facility] [place] no stents per pt, requested via fax Pacemaker/ICD device last checked: n Spinal Cord Stimulator: n/a  Bowel Prep - no  Sleep Study - n/a CPAP -   Fasting Blood Sugar - no checks at home, type 1, no issues since starting dialysis Checks Blood Sugar    Last dose of GLP1 agonist-  N/A GLP1 instructions:  N/A   Last dose of SGLT-2 inhibitors-  N/A SGLT-2 instructions: N/A   Blood Thinner Instructions: n/a Aspirin Instructions: Last Dose:  Activity level: Can go up a flight of stairs and perform activities of daily living without stopping and without symptoms of chest pain or shortness of breath.    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 Patient was also instructed that they will need to review over the PAT instructions again at home before surgery.

## 2022-04-18 ENCOUNTER — Ambulatory Visit (HOSPITAL_BASED_OUTPATIENT_CLINIC_OR_DEPARTMENT_OTHER): Payer: 59 | Admitting: Physician Assistant

## 2022-04-18 ENCOUNTER — Encounter (HOSPITAL_COMMUNITY)
Admission: RE | Disposition: A | Discharge status: Home / Self Care | Payer: Self-pay | Source: Home / Self Care | Attending: Vascular Surgery

## 2022-04-18 ENCOUNTER — Ambulatory Visit (HOSPITAL_COMMUNITY): Payer: 59 | Admitting: Physician Assistant

## 2022-04-18 ENCOUNTER — Other Ambulatory Visit: Payer: Self-pay

## 2022-04-18 ENCOUNTER — Observation Stay (HOSPITAL_COMMUNITY)
Admission: RE | Admit: 2022-04-18 | Discharge: 2022-04-19 | Disposition: A | Discharge status: Home / Self Care | Payer: 59 | Attending: Vascular Surgery | Admitting: Vascular Surgery

## 2022-04-18 ENCOUNTER — Encounter (HOSPITAL_COMMUNITY): Payer: Self-pay | Admitting: Vascular Surgery

## 2022-04-18 DIAGNOSIS — Z91040 Latex allergy status: Secondary | ICD-10-CM | POA: Diagnosis not present

## 2022-04-18 DIAGNOSIS — I120 Hypertensive chronic kidney disease with stage 5 chronic kidney disease or end stage renal disease: Secondary | ICD-10-CM

## 2022-04-18 DIAGNOSIS — E1022 Type 1 diabetes mellitus with diabetic chronic kidney disease: Secondary | ICD-10-CM | POA: Diagnosis not present

## 2022-04-18 DIAGNOSIS — N186 End stage renal disease: Secondary | ICD-10-CM | POA: Diagnosis not present

## 2022-04-18 DIAGNOSIS — E1122 Type 2 diabetes mellitus with diabetic chronic kidney disease: Secondary | ICD-10-CM | POA: Diagnosis not present

## 2022-04-18 DIAGNOSIS — Z992 Dependence on renal dialysis: Secondary | ICD-10-CM | POA: Diagnosis not present

## 2022-04-18 DIAGNOSIS — N185 Chronic kidney disease, stage 5: Secondary | ICD-10-CM | POA: Diagnosis not present

## 2022-04-18 HISTORY — PX: AV FISTULA PLACEMENT: SHX1204

## 2022-04-18 HISTORY — DX: Anemia, unspecified: D64.9

## 2022-04-18 LAB — CBC
HCT: 28.3 % — ABNORMAL LOW (ref 36.0–46.0)
Hemoglobin: 8.8 g/dL — ABNORMAL LOW (ref 12.0–15.0)
MCH: 30.6 pg (ref 26.0–34.0)
MCHC: 31.1 g/dL (ref 30.0–36.0)
MCV: 98.3 fL (ref 80.0–100.0)
Platelets: 243 10*3/uL (ref 150–400)
RBC: 2.88 MIL/uL — ABNORMAL LOW (ref 3.87–5.11)
RDW: 17.8 % — ABNORMAL HIGH (ref 11.5–15.5)
WBC: 9.2 10*3/uL (ref 4.0–10.5)
nRBC: 0 % (ref 0.0–0.2)

## 2022-04-18 LAB — POCT I-STAT, CHEM 8
BUN: 29 mg/dL — ABNORMAL HIGH (ref 6–20)
Calcium, Ion: 1.09 mmol/L — ABNORMAL LOW (ref 1.15–1.40)
Chloride: 94 mmol/L — ABNORMAL LOW (ref 98–111)
Creatinine, Ser: 8.4 mg/dL — ABNORMAL HIGH (ref 0.44–1.00)
Glucose, Bld: 171 mg/dL — ABNORMAL HIGH (ref 70–99)
HCT: 35 % — ABNORMAL LOW (ref 36.0–46.0)
Hemoglobin: 11.9 g/dL — ABNORMAL LOW (ref 12.0–15.0)
Potassium: 3.9 mmol/L (ref 3.5–5.1)
Sodium: 135 mmol/L (ref 135–145)
TCO2: 27 mmol/L (ref 22–32)

## 2022-04-18 LAB — COMPREHENSIVE METABOLIC PANEL
ALT: 8 U/L (ref 0–44)
AST: 13 U/L — ABNORMAL LOW (ref 15–41)
Albumin: 3.4 g/dL — ABNORMAL LOW (ref 3.5–5.0)
Alkaline Phosphatase: 48 U/L (ref 38–126)
Anion gap: 17 — ABNORMAL HIGH (ref 5–15)
BUN: 31 mg/dL — ABNORMAL HIGH (ref 6–20)
CO2: 22 mmol/L (ref 22–32)
Calcium: 8.6 mg/dL — ABNORMAL LOW (ref 8.9–10.3)
Chloride: 96 mmol/L — ABNORMAL LOW (ref 98–111)
Creatinine, Ser: 7.9 mg/dL — ABNORMAL HIGH (ref 0.44–1.00)
GFR, Estimated: 6 mL/min — ABNORMAL LOW (ref >60)
Glucose, Bld: 127 mg/dL — ABNORMAL HIGH (ref 70–99)
Potassium: 4.8 mmol/L (ref 3.5–5.1)
Sodium: 135 mmol/L (ref 135–145)
Total Bilirubin: 0.2 mg/dL — ABNORMAL LOW (ref 0.3–1.2)
Total Protein: 6.8 g/dL (ref 6.5–8.1)

## 2022-04-18 LAB — HIV ANTIBODY (ROUTINE TESTING W REFLEX): HIV Screen 4th Generation wRfx: NONREACTIVE

## 2022-04-18 LAB — GLUCOSE, CAPILLARY: Glucose-Capillary: 159 mg/dL — ABNORMAL HIGH (ref 70–99)

## 2022-04-18 LAB — PROTIME-INR
INR: 1 (ref 0.8–1.2)
Prothrombin Time: 13.2 seconds (ref 11.4–15.2)

## 2022-04-18 SURGERY — INSERTION OF ARTERIOVENOUS (AV) GORE-TEX GRAFT ARM
Anesthesia: Regional | Laterality: Right

## 2022-04-18 MED ORDER — 0.9 % SODIUM CHLORIDE (POUR BTL) OPTIME
TOPICAL | Status: DC | PRN
  Administered 2022-04-18: 1000 mL

## 2022-04-18 MED ORDER — CHLORHEXIDINE GLUCONATE 4 % EX LIQD: 60.0000 mL | Freq: Once | CUTANEOUS | Status: DC

## 2022-04-18 MED ORDER — CHLORHEXIDINE GLUCONATE 0.12 % MT SOLN
OROMUCOSAL | Status: AC
  Administered 2022-04-18: 15 mL
  Filled 2022-04-18: qty 15

## 2022-04-18 MED ORDER — ONDANSETRON HCL 4 MG/2ML IJ SOLN
INTRAMUSCULAR | Status: DC | PRN
  Administered 2022-04-18: 4 mg via INTRAVENOUS

## 2022-04-18 MED ORDER — OXYCODONE-ACETAMINOPHEN 5-325 MG PO TABS
1.0000 | ORAL_TABLET | ORAL | Status: DC | PRN
  Administered 2022-04-18: 1 via ORAL
  Administered 2022-04-18 – 2022-04-19 (×2): 2 via ORAL
  Filled 2022-04-18 (×3): qty 2

## 2022-04-18 MED ORDER — NIFEDIPINE ER OSMOTIC RELEASE 60 MG PO TB24
90.0000 mg | ORAL_TABLET | Freq: Every day | ORAL | Status: DC
  Administered 2022-04-19: 90 mg via ORAL
  Filled 2022-04-18 (×3): qty 1

## 2022-04-18 MED ORDER — BUPIVACAINE HCL (PF) 0.5 % IJ SOLN
INTRAMUSCULAR | Status: DC | PRN
  Administered 2022-04-18: 15 mL via PERINEURAL

## 2022-04-18 MED ORDER — ALUM & MAG HYDROXIDE-SIMETH 200-200-20 MG/5ML PO SUSP: 15.0000 mL | ORAL | Status: DC | PRN

## 2022-04-18 MED ORDER — OXYCODONE HCL 5 MG/5ML PO SOLN: 5.0000 mg | Freq: Once | ORAL | Status: DC | PRN

## 2022-04-18 MED ORDER — ONDANSETRON HCL 4 MG/2ML IJ SOLN: 4.0000 mg | Freq: Four times a day (QID) | INTRAMUSCULAR | Status: DC | PRN

## 2022-04-18 MED ORDER — HEPARIN SODIUM (PORCINE) 1000 UNIT/ML IJ SOLN
INTRAMUSCULAR | Status: DC | PRN
  Administered 2022-04-18: 2000 [IU] via INTRAVENOUS

## 2022-04-18 MED ORDER — GUAIFENESIN-DM 100-10 MG/5ML PO SYRP: 15.0000 mL | ORAL_SOLUTION | ORAL | Status: DC | PRN

## 2022-04-18 MED ORDER — FENTANYL CITRATE (PF) 100 MCG/2ML IJ SOLN
25.0000 ug | INTRAMUSCULAR | Status: DC | PRN
  Administered 2022-04-18: 50 ug via INTRAVENOUS

## 2022-04-18 MED ORDER — HEMOSTATIC AGENTS (NO CHARGE) OPTIME: TOPICAL | Status: DC | PRN

## 2022-04-18 MED ORDER — ONDANSETRON HCL 4 MG/2ML IJ SOLN: 4.0000 mg | Freq: Once | INTRAMUSCULAR | Status: DC | PRN

## 2022-04-18 MED ORDER — CEFAZOLIN SODIUM-DEXTROSE 2-4 GM/100ML-% IV SOLN
2.0000 g | INTRAVENOUS | Status: AC
  Administered 2022-04-18: 2 g via INTRAVENOUS
  Filled 2022-04-18: qty 100

## 2022-04-18 MED ORDER — FENTANYL CITRATE (PF) 100 MCG/2ML IJ SOLN
INTRAMUSCULAR | Status: AC
  Filled 2022-04-18: qty 2

## 2022-04-18 MED ORDER — CLONIDINE HCL 0.2 MG PO TABS
0.2000 mg | ORAL_TABLET | Freq: Two times a day (BID) | ORAL | Status: DC
  Administered 2022-04-18 – 2022-04-19 (×3): 0.2 mg via ORAL
  Filled 2022-04-18 (×3): qty 1

## 2022-04-18 MED ORDER — ACETAMINOPHEN 500 MG PO TABS
1000.0000 mg | ORAL_TABLET | Freq: Once | ORAL | Status: AC
  Administered 2022-04-18: 1000 mg via ORAL
  Filled 2022-04-18: qty 2

## 2022-04-18 MED ORDER — LIDOCAINE HCL (PF) 1 % IJ SOLN
INTRAMUSCULAR | Status: AC
  Filled 2022-04-18: qty 30

## 2022-04-18 MED ORDER — LIDOCAINE-EPINEPHRINE (PF) 1.5 %-1:200000 IJ SOLN
INTRAMUSCULAR | Status: DC | PRN
  Administered 2022-04-18: 15 mL via PERINEURAL

## 2022-04-18 MED ORDER — PHENYLEPHRINE HCL-NACL 20-0.9 MG/250ML-% IV SOLN
INTRAVENOUS | Status: DC | PRN
  Administered 2022-04-18: 50 ug/min via INTRAVENOUS

## 2022-04-18 MED ORDER — AMISULPRIDE (ANTIEMETIC) 5 MG/2ML IV SOLN: 10.0000 mg | Freq: Once | INTRAVENOUS | Status: DC | PRN

## 2022-04-18 MED ORDER — FENTANYL CITRATE (PF) 250 MCG/5ML IJ SOLN
INTRAMUSCULAR | Status: DC | PRN
  Administered 2022-04-18: 50 ug via INTRAVENOUS
  Administered 2022-04-18 (×2): 25 ug via INTRAVENOUS
  Administered 2022-04-18: 50 ug via INTRAVENOUS

## 2022-04-18 MED ORDER — HEPARIN 6000 UNIT IRRIGATION SOLUTION
Status: DC | PRN
  Administered 2022-04-18: 1

## 2022-04-18 MED ORDER — PROPOFOL 10 MG/ML IV BOLUS
INTRAVENOUS | Status: DC | PRN
  Administered 2022-04-18: 200 mg via INTRAVENOUS
  Administered 2022-04-18: 100 mg via INTRAVENOUS

## 2022-04-18 MED ORDER — ACETAMINOPHEN 325 MG PO TABS: 325.0000 mg | ORAL_TABLET | ORAL | Status: DC | PRN

## 2022-04-18 MED ORDER — PANTOPRAZOLE SODIUM 40 MG PO TBEC
40.0000 mg | DELAYED_RELEASE_TABLET | Freq: Every day | ORAL | Status: DC
  Administered 2022-04-18 – 2022-04-19 (×2): 40 mg via ORAL
  Filled 2022-04-18 (×2): qty 1

## 2022-04-18 MED ORDER — MIDAZOLAM HCL 2 MG/2ML IJ SOLN
INTRAMUSCULAR | Status: DC | PRN
  Administered 2022-04-18: 2 mg via INTRAVENOUS

## 2022-04-18 MED ORDER — HYDRALAZINE HCL 20 MG/ML IJ SOLN
5.0000 mg | INTRAMUSCULAR | Status: DC | PRN
  Administered 2022-04-18: 5 mg via INTRAVENOUS
  Filled 2022-04-18: qty 1

## 2022-04-18 MED ORDER — ATORVASTATIN CALCIUM 10 MG PO TABS
20.0000 mg | ORAL_TABLET | Freq: Every day | ORAL | Status: DC
  Administered 2022-04-18: 20 mg via ORAL
  Filled 2022-04-18 (×2): qty 2

## 2022-04-18 MED ORDER — METOPROLOL SUCCINATE ER 50 MG PO TB24
50.0000 mg | ORAL_TABLET | Freq: Every day | ORAL | Status: DC
  Administered 2022-04-18: 50 mg via ORAL
  Filled 2022-04-18: qty 1

## 2022-04-18 MED ORDER — POTASSIUM CHLORIDE CRYS ER 20 MEQ PO TBCR: 20.0000 meq | EXTENDED_RELEASE_TABLET | Freq: Once | ORAL | Status: DC

## 2022-04-18 MED ORDER — PROTAMINE SULFATE 10 MG/ML IV SOLN
INTRAVENOUS | Status: AC
  Filled 2022-04-18: qty 5

## 2022-04-18 MED ORDER — PHENOL 1.4 % MT LIQD: 1.0000 | OROMUCOSAL | Status: DC | PRN

## 2022-04-18 MED ORDER — ACETAMINOPHEN 325 MG RE SUPP: 325.0000 mg | RECTAL | Status: DC | PRN

## 2022-04-18 MED ORDER — SODIUM CHLORIDE 0.9 % IV SOLN: INTRAVENOUS | Status: DC

## 2022-04-18 MED ORDER — HEPARIN 6000 UNIT IRRIGATION SOLUTION
Status: AC
  Filled 2022-04-18: qty 500

## 2022-04-18 MED ORDER — ONDANSETRON HCL 4 MG/2ML IJ SOLN
INTRAMUSCULAR | Status: AC
  Filled 2022-04-18: qty 2

## 2022-04-18 MED ORDER — OXYCODONE-ACETAMINOPHEN 5-325 MG PO TABS
ORAL_TABLET | ORAL | Status: AC
  Filled 2022-04-18: qty 1

## 2022-04-18 MED ORDER — METOPROLOL TARTRATE 5 MG/5ML IV SOLN: 2.0000 mg | INTRAVENOUS | Status: DC | PRN

## 2022-04-18 MED ORDER — HEPARIN SODIUM (PORCINE) 1000 UNIT/ML IJ SOLN
INTRAMUSCULAR | Status: AC
  Filled 2022-04-18: qty 1

## 2022-04-18 MED ORDER — MIDAZOLAM HCL 2 MG/2ML IJ SOLN
INTRAMUSCULAR | Status: AC
  Filled 2022-04-18: qty 2

## 2022-04-18 MED ORDER — LABETALOL HCL 5 MG/ML IV SOLN: 10.0000 mg | INTRAVENOUS | Status: DC | PRN

## 2022-04-18 MED ORDER — FENTANYL CITRATE (PF) 250 MCG/5ML IJ SOLN
INTRAMUSCULAR | Status: AC
  Filled 2022-04-18: qty 5

## 2022-04-18 MED ORDER — OXYCODONE HCL 5 MG PO TABS: 5.0000 mg | ORAL_TABLET | Freq: Once | ORAL | Status: DC | PRN

## 2022-04-18 SURGICAL SUPPLY — 42 items
ADH SKN CLS APL DERMABOND .7 (GAUZE/BANDAGES/DRESSINGS) ×1
ARMBAND PINK RESTRICT EXTREMIT (MISCELLANEOUS) ×1 IMPLANT
BAG COUNTER SPONGE SURGICOUNT (BAG) ×1 IMPLANT
BAG SPNG CNTER NS LX DISP (BAG) ×1
BLADE CLIPPER SURG (BLADE) ×1 IMPLANT
BNDG ELASTIC 4X5.8 VLCR STR LF (GAUZE/BANDAGES/DRESSINGS) ×1 IMPLANT
CANISTER SUCT 3000ML PPV (MISCELLANEOUS) ×1 IMPLANT
CLIP LIGATING EXTRA MED SLVR (CLIP) ×1 IMPLANT
CLIP LIGATING EXTRA SM BLUE (MISCELLANEOUS) ×1 IMPLANT
CLIP VESOCCLUDE MED 6/CT (CLIP) ×1 IMPLANT
COVER PROBE W GEL 5X96 (DRAPES) ×1 IMPLANT
DERMABOND ADVANCED .7 DNX12 (GAUZE/BANDAGES/DRESSINGS) ×1 IMPLANT
ELECT REM PT RETURN 9FT ADLT (ELECTROSURGICAL) ×1
ELECTRODE REM PT RTRN 9FT ADLT (ELECTROSURGICAL) ×1 IMPLANT
GLOVE BIO SURGEON STRL SZ7.5 (GLOVE) ×1 IMPLANT
GLOVE BIOGEL PI IND STRL 8 (GLOVE) ×1 IMPLANT
GLOVE SRG 8 PF TXTR STRL LF DI (GLOVE) ×1 IMPLANT
GLOVE SURG POLYISO LF SZ8 (GLOVE) IMPLANT
GLOVE SURG UNDER POLY LF SZ8 (GLOVE) ×1
GOWN STRL REUS W/ TWL LRG LVL3 (GOWN DISPOSABLE) ×2 IMPLANT
GOWN STRL REUS W/TWL 2XL LVL3 (GOWN DISPOSABLE) ×2 IMPLANT
GOWN STRL REUS W/TWL LRG LVL3 (GOWN DISPOSABLE) ×2
GRAFT GORETEX STRT 4-7X45 (Vascular Products) IMPLANT
HEMOSTAT SNOW SURGICEL 2X4 (HEMOSTASIS) IMPLANT
KIT BASIN OR (CUSTOM PROCEDURE TRAY) ×1 IMPLANT
KIT TURNOVER KIT B (KITS) ×1 IMPLANT
NS IRRIG 1000ML POUR BTL (IV SOLUTION) ×1 IMPLANT
PACK CV ACCESS (CUSTOM PROCEDURE TRAY) ×1 IMPLANT
PAD ARMBOARD 7.5X6 YLW CONV (MISCELLANEOUS) ×2 IMPLANT
SLING ARM FOAM STRAP LRG (SOFTGOODS) IMPLANT
SLING ARM FOAM STRAP MED (SOFTGOODS) IMPLANT
SPIKE FLUID TRANSFER (MISCELLANEOUS) ×1 IMPLANT
SUT MNCRL AB 4-0 PS2 18 (SUTURE) ×1 IMPLANT
SUT PROLENE 6 0 BV (SUTURE) ×1 IMPLANT
SUT PROLENE 7 0 BV 1 (SUTURE) IMPLANT
SUT SILK 2 0 PERMA HAND 18 BK (SUTURE) IMPLANT
SUT SILK 3 0 SH CR/8 (SUTURE) ×1 IMPLANT
SUT VIC AB 3-0 SH 27 (SUTURE) ×2
SUT VIC AB 3-0 SH 27X BRD (SUTURE) ×2 IMPLANT
TOWEL GREEN STERILE (TOWEL DISPOSABLE) ×1 IMPLANT
UNDERPAD 30X36 HEAVY ABSORB (UNDERPADS AND DIAPERS) ×1 IMPLANT
WATER STERILE IRR 1000ML POUR (IV SOLUTION) ×1 IMPLANT

## 2022-04-18 NOTE — Op Note (Signed)
NAME: Shannon Caldwell    MRN: [id number] DOB: August 07, 1970    DATE OF OPERATION: 04/18/2022  PREOP DIAGNOSIS:    End stage renal disease  POSTOP DIAGNOSIS:    Same  PROCEDURE:    Right arm brachial artery to axillary vein AV graft  SURGEON: [name]  ASSIST: Dagoberto Ligas  ANESTHESIA: General   EBL: 111ml  INDICATIONS:    Shannon Caldwell is a 51 y.o. female who presents with end stage renal disease currently being dialyzed through a right-sided tunneled HD catheter in need of long-term access.  Shannon Caldwell.  She has seen [facility] for second opinion regarding redo peritoneal dialysis catheter placement.  They also felt that she was not a candidate.    On physical exam today, Shannon Caldwell had new left arm varicosities appreciated at the level of the shoulder.  I am concerned with her previous PICC line history and prior left arm swelling, that she may have central stenosis versus occlusion on the left side.  After discussing the risk and benefits of right-sided AV graft placement, Shannon Caldwell elected to proceed.  Unfortunately, the left side has been blocked.  I discussed this at length with both her and her daughter and stated we could cancel the case and retry another day versus leaving the block in the left and using local anesthesia in the arm on the right.  After discussing the risk and benefits, Shannon Caldwell felt comfortable proceeding with right arm AV graft creation.  FINDINGS:   60mm axillary vein branch 26mm brachial artery   TECHNIQUE:   After informed consent was obtained, the patient was brought to the operating room, placed supine on the operating room table.  General anesthesia was used.  The patient was prepped and draped in normal sterile fashion.  Surgical time-out was taken. Pre-op antibiotics were given.  Procedure began with using Caldwell and sent the  brachial artery and axillary vein in the right arm.  Both proved of sufficient size to continue with AV graft.  A longitudinal incision was made at the distal aspect of the humerus. The brachial artery was identified, gently dissected, and encircled with silastic loops. A second incision was then made over the axillary vein and similarly dissected and encircled. The tunneling device was then passed between these incisions. A 4 X 7 taper PTFE graft was passed through the tunnel, taking care to avoid kinking or twists. The graft was irrigated with heparinized saline solution.   Proximal and distal arterial control was then obtained and a longitudinal arteriotomy was made on the brachial artery. The artery was irrigated with heparinized saline solution. An end to side artery anastomosis was then performed from the 50mm graft to the brachial vein in a running fashion using running 6-0 prolene suture. When the clamps were removed from the artery, the graft demonstrated excellent inflow. It was flushed with heparinized saline prior to being clamped. There was an excellent radial signal at the wrist.  Attention was then directed to the axillary vein anastomosis. Proximal and distal control were obtained and a longitudinal venotomy was made. The vein was then irrigated with heparinized saline solution. The end of the PTFE graft was then cut in a beveled fashion at the appropriate length. An end to end vein anastomosis was then performed to an 76mm branch of the axillary vein, using a running, 6-0 prolene suture. Prior to establishing  flow, all vessels were back bled and flushed to ensure there was no clot.    There was a pulse in the radial artery and a thrill in the vein centrally. Hemostasis was found to be satisfactory, and all wounds were irrigated with saline solution and closed in layers with vicryl and Monocryl at the skin. A dry sterile dressing was applied. Anesthetic care was terminated. The patient was  transported to the recovery room in stable condition.   Macie Burows, MD [facility] DATE OF DICTATION:   04/18/2022

## 2022-04-18 NOTE — Anesthesia Postprocedure Evaluation (Signed)
Anesthesia Post Note  Patient: Shannon Caldwell  Procedure(s) Performed: INSERTION OF ARTERIOVENOUS (AV) GORE-TEX GRAFT RIGHT ARM (Right)     Patient location during evaluation: PACU Anesthesia Type: General Level of consciousness: awake and alert Pain management: pain level controlled Vital Signs Assessment: post-procedure vital signs reviewed and stable Respiratory status: spontaneous breathing, nonlabored ventilation and respiratory function stable Cardiovascular status: blood pressure returned to baseline and stable Postop Assessment: no apparent nausea or vomiting Anesthetic complications: no   No notable events documented.  Last Vitals:  Vitals:   04/18/22 0950 04/18/22 1000  BP: (!) 188/67 (!) 178/76  Pulse: 69 67  Resp: 20 19  Temp: 36.4 C   SpO2: 93% 97%    Last Pain:  Vitals:   04/18/22 0950  TempSrc:   PainSc: 8                  Lidia Collum

## 2022-04-18 NOTE — Transfer of Care (Signed)
Immediate Anesthesia Transfer of Care Note  Patient: Shannon Caldwell  Procedure(s) Performed: INSERTION OF ARTERIOVENOUS (AV) GORE-TEX GRAFT RIGHT ARM (Right)  Patient Location: PACU  Anesthesia Type:GA combined with regional for post-op pain  Level of Consciousness: awake, alert , and oriented  Airway & Oxygen Therapy: Patient Spontanous Breathing and Patient connected to nasal cannula oxygen  Post-op Assessment: Report given to RN, Post -op Vital signs reviewed and stable, and Patient able to stick tongue midline  Post vital signs: Reviewed  Last Vitals:  Vitals Value Taken Time  BP 188/67 04/18/22 0949  Temp 97.7   Pulse 70 04/18/22 0952  Resp 18 04/18/22 0952  SpO2 93 % 04/18/22 0952  Vitals shown include unvalidated device data.  Last Pain:  Vitals:   04/18/22 0628  TempSrc:   PainSc: 0-No pain         Complications: No notable events documented.

## 2022-04-18 NOTE — H&P (Addendum)
Office Note    HPI: Shannon Caldwell is a Right handed 51 y.o. (08-14-1970) female with kidney disease who presents at the request of Shannon John, MD for permanent HD access.  Prior access procedures includes PD catheter, pulled several months ago due to peritonitis.  Currently being dialyzed through a right-sided tunneled IJ line.  Surgical history also includes midline laparotomy status post ERCP complication resulting in partial pancreatectomy, cholecystectomy.  At the time of her initial consultation, Shannon Caldwell wanted to pursue peritoneal dialysis again.  I referred her to [facility] for a second opinion as I did not feel she was a candidate due to her numerous abdominal surgeries.  Shannon Caldwell presents today accompanied by her daughter.  She had no questions.  Shannon Caldwell noted a prior history of left-sided PICC line and varicosities appreciated at the level of the shoulder.    Past Medical History:  Diagnosis Date   Abnormal vaginal bleeding    Anemia    Blood transfusion    Cervical pain (neck)    due to abscess    Diabetes mellitus, type 2 (HCC)    changed to type 1   Generalized headaches    GERD (gastroesophageal reflux disease)    Hyperlipidemia    Hypertension    Renal failure, chronic, stage 4 (severe) (Shannon Caldwell) 07/07/2020    Past Surgical History:  Procedure Laterality Date   APPENDECTOMY     CARDIAC CATHETERIZATION     CHOLECYSTECTOMY     cytocyst removal     HERNIA REPAIR  August 2000, August 7408   umbilical hernia    pancreatic debridgement      Social History   Socioeconomic History   Marital status: Single    Spouse name: Not on file   Number of children: 3   Years of education: Not on file   Highest education level: Not on file  Occupational History   Occupation: nursing student    Employer: [employer]   Occupation: med Designer, multimedia    Comment: [place] on weekends   Occupation: [employer]    Comment: during week  Tobacco Use   Smoking status:  Never   Smokeless tobacco: Never  Vaping Use   Vaping Use: Never used  Substance and Sexual Activity   Alcohol use: No   Drug use: No   Sexual activity: Not on file  Other Topics Concern   Not on file  Social History Narrative   Daughter 24, son 18, son 68   Grandson lives with Patient   Social Determinants of Radio broadcast assistant Strain: Not on file  Food Insecurity: Not on file  Transportation Needs: Not on file  Physical Activity: Not on file  Stress: Not on file  Social Connections: Not on file  Intimate Partner Violence: Not on file   Family History  Problem Relation Age of Onset   Hypertension Mother        grandfather   Diabetes Mother        grandmother   Stroke Other        grandparent   Lung cancer Maternal Grandfather     Current Facility-Administered Medications  Medication Dose Route Frequency Provider Last Rate Last Admin   0.9 %  sodium chloride infusion   Intravenous Continuous Shannon John, MD       0.9 % irrigation (POUR BTL)    PRN Shannon John, MD   1,000 mL at 04/18/22 0728   ceFAZolin (ANCEF) IVPB 2g/100 mL  premix  2 g Intravenous 30 min Pre-Op Shannon John, MD       chlorhexidine (HIBICLENS) 4 % liquid 4 Application  60 mL Topical Once Shannon John, MD       And   [START ON 04/19/2022] chlorhexidine (HIBICLENS) 4 % liquid 4 Application  60 mL Topical Once Shannon John, MD       heparin 6000 units / NS 500 mL irrigation    PRN Shannon John, MD   1 Application at 80/99/83 3825   Facility-Administered Medications Ordered in Other Encounters  Medication Dose Route Frequency Provider Last Rate Last Admin   fentaNYL citrate (PF) (SUBLIMAZE) injection   Intravenous Anesthesia Intra-op Maude Leriche, CRNA   50 mcg at 04/18/22 0715   midazolam (VERSED) injection   Intravenous Anesthesia Intra-op Maude Leriche, CRNA   2 mg at 04/18/22 0715    Allergies  Allergen Reactions   Amlodipine Shortness Of Breath  and Swelling   Latex Itching   Morphine Hives and Itching   Shellfish-Derived Products Itching and Swelling     REVIEW OF SYSTEMS:   [X]  denotes positive finding, [ ]  denotes negative finding Cardiac  Comments:  Chest pain or chest pressure:    Shortness of breath upon exertion:    Short of breath when lying flat:    Irregular heart rhythm:        Vascular    Pain in calf, thigh, or hip brought on by ambulation:    Pain in feet at night that wakes you up from your sleep:     Blood clot in your veins:    Leg swelling:         Pulmonary    Oxygen at home:    Productive cough:     Wheezing:         Neurologic    Sudden weakness in arms or legs:     Sudden numbness in arms or legs:     Sudden onset of difficulty speaking or slurred speech:    Temporary loss of vision in one eye:     Problems with dizziness:         Gastrointestinal    Blood in stool:     Vomited blood:         Genitourinary    Burning when urinating:     Blood in urine:        Psychiatric    Major depression:         Hematologic    Bleeding problems:    Problems with blood clotting too easily:        Skin    Rashes or ulcers:        Constitutional    Fever or chills:      PHYSICAL EXAMINATION:  Vitals:   04/18/22 0548  BP: 131/72  Pulse: 66  Resp: 18  Temp: 98.3 F (36.8 C)  TempSrc: Oral  SpO2: 100%  Weight: 64.4 kg  Height: 5\' 5"  (1.651 m)    General:  WDWN in NAD; vital signs documented above Gait: Not observed HENT: WNL, normocephalic Pulmonary: normal non-labored breathing , without Rales, rhonchi,  wheezing Cardiac: regular HR, without   Abdomen: soft, NT, no masses Skin: without rashes Vascular Exam/Pulses:  Right Left  Radial 2+ (normal) 2+ (normal)                       Extremities: without ischemic changes, without Gangrene , without  cellulitis; without open wounds;  Musculoskeletal: no muscle wasting or atrophy  Neurologic: A&O X 3;  No focal weakness or  paresthesias are detected Psychiatric:  The pt has Normal affect.   Non-Invasive Vascular Imaging:   +-----------------+-------------+----------+--------------+  Right Cephalic   Diameter (cm)Depth (cm)   Findings     +-----------------+-------------+----------+--------------+  Shoulder             0.18                               +-----------------+-------------+----------+--------------+  Prox upper arm       0.16                               +-----------------+-------------+----------+--------------+  Mid upper arm        0.11                               +-----------------+-------------+----------+--------------+  Dist upper arm       0.13                               +-----------------+-------------+----------+--------------+  Antecubital fossa    0.26                               +-----------------+-------------+----------+--------------+  Prox forearm         0.16               not visualized  +-----------------+-------------+----------+--------------+  Dist forearm         0.07                               +-----------------+-------------+----------+--------------+   +-----------------+-------------+----------+--------------+  Right Basilic    Diameter (cm)Depth (cm)   Findings     +-----------------+-------------+----------+--------------+  Prox upper arm                          not visualized  +-----------------+-------------+----------+--------------+  Mid upper arm     0.18 / 0.11                           +-----------------+-------------+----------+--------------+  Dist upper arm       0.09                               +-----------------+-------------+----------+--------------+  Antecubital fossa    0.11                               +-----------------+-------------+----------+--------------+  Prox forearm                            not visualized   +-----------------+-------------+----------+--------------+  Mid forearm          0.09                               +-----------------+-------------+----------+--------------+  Distal forearm       0.05                               +-----------------+-------------+----------+--------------+   +-----------------+-------------+----------+--------------+  Left Cephalic    Diameter (cm)Depth (cm)   Findings     +-----------------+-------------+----------+--------------+  Shoulder             0.15                               +-----------------+-------------+----------+--------------+  Prox upper arm       0.20                               +-----------------+-------------+----------+--------------+  Mid upper arm     0.28 / 0.26           mid to distal   +-----------------+-------------+----------+--------------+  Dist upper arm       0.14               not visualized  +-----------------+-------------+----------+--------------+  Antecubital fossa    0.17                               +-----------------+-------------+----------+--------------+  Prox forearm         0.12                               +-----------------+-------------+----------+--------------+  Mid forearm          0.09                               +-----------------+-------------+----------+--------------+  Dist forearm         0.08                               +-----------------+-------------+----------+--------------+   +-----------------+-------------+----------+----------------------------+  Left Basilic     Diameter (cm)Depth (cm)          Findings            +-----------------+-------------+----------+----------------------------+  Shoulder                                       not visualized         +-----------------+-------------+----------+----------------------------+  Prox upper arm                                 not visualized          +-----------------+-------------+----------+----------------------------+  Mid upper arm                                  not visualized         +-----------------+-------------+----------+----------------------------+  Dist upper arm                          not visualized and too small  +-----------------+-------------+----------+----------------------------+  Antecubital fossa    0.11                                             +-----------------+-------------+----------+----------------------------+  Prox  forearm                                   not visualized         +-----------------+-------------+----------+----------------------------+   Summary:     Measurements above.      NOTE: Technically difficult exam due to small veins.   *See table(s) above for measurements and observations.     Diagnosing physician: Orlie Pollen     ASSESSMENT/PLAN:  Forrestine Lecrone is a 51 y.o. female who presents with end stage renal disease currently being dialyzed through a right-sided tunneled HD catheter in need of long-term access.  Gerald Stabs was initially seen in the office and offered AV graft as bilateral superficial veins were small on duplex ultrasound.  She has seen [facility] for second opinion regarding redo peritoneal dialysis catheter placement.  They also felt that she was not a candidate.   On physical exam today, Janila had new left arm varicosities appreciated at the level of the shoulder.  I am concerned with her previous PICC line history and prior left arm swelling, that she may have central stenosis versus occlusion on the left side.  After discussing the risk and benefits of right-sided AV graft placement, Darlisa elected to proceed.  Unfortunately, the left side has been blocked.  I discussed this at length with both her and her daughter and stated we could cancel the case and retry another day versus leaving the block in the left and using local anesthesia in  the arm on the right.  After discussing the risk and benefits, Katiria felt comfortable proceeding with right arm AV graft creation.    Shannon John, MD [facility] [phone]

## 2022-04-18 NOTE — Anesthesia Procedure Notes (Signed)
Procedure Name: LMA Insertion Date/Time: 04/18/2022 8:08 AM  Performed by: Maude Leriche, CRNAPre-anesthesia Checklist: Patient identified, Emergency Drugs available, Suction available, Patient being monitored and Timeout performed Patient Re-evaluated:Patient Re-evaluated prior to induction Oxygen Delivery Method: Circle system utilized Preoxygenation: Pre-oxygenation with 100% oxygen Induction Type: IV induction LMA: LMA inserted LMA Size: 4.0 Number of attempts: 1 Placement Confirmation: positive ETCO2 and breath sounds checked- equal and bilateral Tube secured with: Tape Dental Injury: Teeth and Oropharynx as per pre-operative assessment

## 2022-04-18 NOTE — Anesthesia Procedure Notes (Signed)
Anesthesia Regional Block: Supraclavicular block   Pre-Anesthetic Checklist: , timeout performed,  Correct Patient, Correct Site, Correct Laterality,  Correct Procedure, Correct Position, site marked,  Risks and benefits discussed,  Surgical consent,  Pre-op evaluation,  At surgeon's request and post-op pain management  Laterality: Left  Prep: chloraprep       Needles:  Injection technique: Single-shot  Needle Type: Echogenic Stimulator Needle     Needle Length: 10cm  Needle Gauge: 20     Additional Needles:   Procedures:,,,, ultrasound used (permanent image in chart),,    Narrative:  Start time: 04/18/2022 7:09 AM End time: 04/18/2022 7:13 AM Injection made incrementally with aspirations every 5 mL.  Performed by: Personally  Anesthesiologist: Lidia Collum, MD  Additional Notes: Standard monitors applied. Skin prepped. Good needle visualization with ultrasound. Injection made in 5cc increments with no resistance to injection. Patient tolerated the procedure well.

## 2022-04-19 ENCOUNTER — Other Ambulatory Visit (HOSPITAL_COMMUNITY): Payer: Self-pay

## 2022-04-19 ENCOUNTER — Encounter (HOSPITAL_COMMUNITY): Payer: Self-pay | Admitting: Vascular Surgery

## 2022-04-19 DIAGNOSIS — N186 End stage renal disease: Secondary | ICD-10-CM | POA: Diagnosis not present

## 2022-04-19 LAB — HEPATITIS B SURFACE ANTIGEN: Hepatitis B Surface Ag: NONREACTIVE

## 2022-04-19 MED ORDER — OXYCODONE-ACETAMINOPHEN 5-325 MG PO TABS
1.0000 | ORAL_TABLET | Freq: Four times a day (QID) | ORAL | 0 refills | Status: DC | PRN
  Filled 2022-04-19: qty 20, 5d supply, fill #0

## 2022-04-19 MED ORDER — CHLORHEXIDINE GLUCONATE CLOTH 2 % EX PADS
6.0000 | MEDICATED_PAD | Freq: Every day | CUTANEOUS | Status: DC
  Administered 2022-04-19: 6 via TOPICAL

## 2022-04-19 NOTE — Progress Notes (Addendum)
Contacted by nephrologist regarding pt needing out-pt HD 2nd shift today or tomorrow at [facility]. Contacted clinic and was advised that clinic is unable to provide 2nd shift treatment today and unable to provide a treatment time for tomorrow (at this time) due to staffing. Update provided to nephrologist and attending. Pt unable to be d/c to home without appt for out-pt HD tomorrow in place. Pt to require inpt HD today for this reason. Pt's case escalated with [facility] and awaiting a response in the event an appt can be provided for tomorrow prior to pt's treatment here today.   Melven Sartorius Renal Navigator [phone]  Addendum at 12:59 pm: Contacted [facility] again and spoke to [place]. Clinic is able to treat pt tomorrow. Pt will need to arrive at 10:00 for 10:20 chair time. Spoke to pt via phone and she is agreeable to plan. Update provided to treatment team and nephrologist is agreeable to plan as well. Update provided to pt's RN via phone. Contacted inpt HD unit to advise them that pt will not require inpt HD today and will receive out-pt treatment tomorrow. Clinic aware to expect pt tomorrow. Appt added to pt's AVS.

## 2022-04-19 NOTE — Progress Notes (Addendum)
[facility] [place]  Subjective  - Pain at incisions.   Objective (!) 166/70 72 98 F (36.7 C) (Oral) 17 96%  Intake/Output Summary (Last 24 hours) at 04/19/2022 3338 Last data filed at 04/18/2022 0946 Gross per 24 hour  Intake 300 ml  Output 100 ml  Net 200 ml    Guarded movement with pain at new incisions Incisions healing well without hematoma Palpable thrill in graft as well as palpable radial pulse Lungs non labored breathing  Assessment/Planning: POD # R UE AV graft placement Patient has working right [facility] She normally does HD today at 6 am OP.  I will contact the HD coordinator for plan to change time verse HD here prior to discharge.  Do not stick right graft for 4 weeks f/u PRN  Roxy Horseman 04/19/2022 7:28 AM --  VASCULAR STAFF ADDENDUM: I have independently interviewed and examined the patient. I agree with the above.  Home today once dialysis arranged/ completed.   Cassandria Santee, MD [facility] Phone Number: [phone] 04/19/2022 11:34 AM    Laboratory Lab Results: Recent Labs    04/18/22 0638 04/18/22 1452  WBC  --  9.2  HGB 11.9* 8.8*  HCT 35.0* 28.3*  PLT  --  243   BMET Recent Labs    04/18/22 0638 04/18/22 1452  NA 135 135  K 3.9 4.8  CL 94* 96*  CO2  --  22  GLUCOSE 171* 127*  BUN 29* 31*  CREATININE 8.40* 7.90*  CALCIUM  --  8.6*    COAG Lab Results  Component Value Date   INR 1.0 04/18/2022   No results found for: "PTT"

## 2022-04-19 NOTE — Progress Notes (Signed)
Asked to see patient for dialysis. Pt gets HD TTS at [facility].  She was admitted overnight after a new permanent access procedure (AVG placement). She has an existing TDC.  Her OP HD unit doesn't have room for HD today but can get her a spot for HD tomorrow off schedule. Her labs and volume are stable. Hopefully she can be dc'd today and get her HD as outpatient tomorrow.   OP HD: TTS [place] 3.5h  350/ 600  64.6kg  2/2.5 bath  Heparin 2000  RIJ TDC   Rob Belle Charlie, MD 04/19/2022, 9:11 AM  Recent Labs  Lab 04/18/22 0638 04/18/22 1452  HGB 11.9* 8.8*  ALBUMIN  --  3.4*  CALCIUM  --  8.6*  CREATININE 8.40* 7.90*  K 3.9 4.8    Inpatient medications:  atorvastatin  20 mg Oral Daily   cloNIDine  0.2 mg Oral BID   metoprolol succinate  50 mg Oral QHS   NIFEdipine  90 mg Oral Daily   pantoprazole  40 mg Oral Daily    acetaminophen **OR** acetaminophen, guaiFENesin-dextromethorphan, hydrALAZINE, labetalol, metoprolol tartrate, ondansetron, oxyCODONE-acetaminophen, phenol

## 2022-04-19 NOTE — Discharge Instructions (Signed)
Use your right UE for daily activities, you may bath your arm with soap and water. Do not use the graft for HD for 4 weeks total.  F/U as needed.

## 2022-04-20 LAB — HEPATITIS B SURFACE ANTIBODY, QUANTITATIVE: Hep B S AB Quant (Post): 661.4 m[IU]/mL (ref >9.9)

## 2022-04-20 NOTE — Discharge Summary (Signed)
[facility] Discharge Summary   Patient ID:  Shannon Caldwell MRN: [id number] DOB/AGE: July 18, 1970 51 y.o.  Admit date: 04/18/2022 Discharge date: 04/19/22 Date of Surgery: 04/18/2022 Surgeon: Surgeon(s): [name], MD  Admission Diagnosis: ESRD (end stage renal disease) ([place]) [N18.6]  Discharge Diagnoses:  ESRD (end stage renal disease) ([place]) [N18.6]  Secondary Diagnoses: Past Medical History:  Diagnosis Date   Abnormal vaginal bleeding    Anemia    Blood transfusion    Cervical pain (neck)    due to abscess    Diabetes mellitus, type 2 ([place])    changed to type 1   Generalized headaches    GERD (gastroesophageal reflux disease)    Hyperlipidemia    Hypertension    Renal failure, chronic, stage 4 (severe) ([place]) 07/07/2020    Procedure(s): INSERTION OF ARTERIOVENOUS (AV) GORE-TEX GRAFT RIGHT ARM  Discharged Condition: good  HPI: Shannon Caldwell is a 51 y.o. female who presents with end stage renal disease currently being dialyzed through a right-sided tunneled HD catheter in need of long-term access. On physical exam today, Shannon Caldwell had new left arm varicosities appreciated at the level of the shoulder.  I am concerned with her previous PICC line history and prior left arm swelling, that she may have central stenosis versus occlusion on the left side.  After discussing the risk and benefits of right-sided AV graft placement, Estefanie elected to proceed.    Hospital Course:  [name] is a 50 y.o. female is S/P  Procedure(s): INSERTION OF ARTERIOVENOUS (AV) GORE-TEX GRAFT RIGHT ARM  POD # 1 R UE AV graft placement Patient has working right [facility] She normally does HD today at 6 am OP.  I will contact the HD coordinator for plan to change time verse HD here prior to discharge.   Do not stick right graft for 4 weeks f/u PRN  Consults:  Treatment Team:  Roney Jaffe, MD  Significant Diagnostic Studies: CBC Lab Results  Component Value Date    WBC 9.2 04/18/2022   HGB 8.8 (L) 04/18/2022   HCT 28.3 (L) 04/18/2022   MCV 98.3 04/18/2022   PLT 243 04/18/2022    BMET    Component Value Date/Time   NA 135 04/18/2022 1452   K 4.8 04/18/2022 1452   CL 96 (L) 04/18/2022 1452   CO2 22 04/18/2022 1452   GLUCOSE 127 (H) 04/18/2022 1452   BUN 31 (H) 04/18/2022 1452   CREATININE 7.90 (H) 04/18/2022 1452   CREATININE 0.77 11/22/2011 2022   CALCIUM 8.6 (L) 04/18/2022 1452   GFRNONAA 6 (L) 04/18/2022 1452   GFRAA >60 01/03/2016 2010   COAG Lab Results  Component Value Date   INR 1.0 04/18/2022     Disposition:  Discharge to :Home Discharge Instructions     Call MD for:  redness, tenderness, or signs of infection (pain, swelling, bleeding, redness, odor or green/yellow discharge around incision site)   Complete by: As directed    Call MD for:  redness, tenderness, or signs of infection (pain, swelling, bleeding, redness, odor or green/yellow discharge around incision site)   Complete by: As directed    Call MD for:  severe or increased pain, loss or decreased feeling  in affected limb(s)   Complete by: As directed    Call MD for:  severe or increased pain, loss or decreased feeling  in affected limb(s)   Complete by: As directed    Call MD for:  temperature >100.5   Complete by:  As directed    Call MD for:  temperature >100.5   Complete by: As directed    Resume previous diet   Complete by: As directed    Resume previous diet   Complete by: As directed       Allergies as of 04/19/2022       Reactions   Amlodipine Shortness Of Breath, Swelling   Latex Itching   Morphine Hives, Itching   Shellfish-derived Products Itching, Swelling        Medication List     TAKE these medications    albuterol 108 (90 Base) MCG/ACT inhaler Commonly known as: VENTOLIN HFA Inhale 2 puffs into the lungs every 6 (six) hours as needed for wheezing or shortness of breath.   atorvastatin 20 MG tablet Commonly known as:  LIPITOR Take 20 mg by mouth daily.   cloNIDine 0.2 MG tablet Commonly known as: CATAPRES Take 0.2 mg by mouth 2 (two) times daily.   metoprolol succinate 50 MG 24 hr tablet Commonly known as: TOPROL-XL Take 50 mg by mouth at bedtime.   NIFEdipine 90 MG 24 hr tablet Commonly known as: ADALAT CC Take 90 mg by mouth daily.   oxyCODONE-acetaminophen 5-325 MG tablet Commonly known as: PERCOCET/ROXICET Take 1 tablet by mouth every 6 (six) hours as needed for moderate pain.   pantoprazole 40 MG tablet Commonly known as: PROTONIX Take 40 mg by mouth daily.   sevelamer carbonate 800 MG tablet Commonly known as: RENVELA Take 800 mg by mouth 3 (three) times daily with meals.   spironolactone 25 MG tablet Commonly known as: ALDACTONE Take 25 mg by mouth daily.   torsemide 20 MG tablet Commonly known as: DEMADEX Take 20 mg by mouth daily.   Zenpep 5000-24000 units Cpep Generic drug: Pancrelipase (Lip-Prot-Amyl) Take 1 capsule by mouth daily.       Verbal and written Discharge instructions given to the patient. Wound care per Discharge AVS  Follow-up Information     [name], MD Follow up.   Specialty: Vascular Surgery Why: As needed Contact information: [address] [place] [address] [place], [facility]. Go on 04/20/2022.   Why: Arrive at 10:00 for 10:20 chair time for make-up treatment. Contact information: [place] [address] [phone]                 Signed: Roxy Horseman 04/20/2022, 6:41 AM

## 2022-05-24 ENCOUNTER — Other Ambulatory Visit: Payer: Self-pay | Admitting: *Deleted

## 2022-05-24 DIAGNOSIS — N186 End stage renal disease: Secondary | ICD-10-CM

## 2022-05-27 ENCOUNTER — Ambulatory Visit (HOSPITAL_COMMUNITY)
Admission: RE | Admit: 2022-05-27 | Discharge: 2022-05-27 | Disposition: A | Discharge status: Home / Self Care | Payer: 59 | Source: Ambulatory Visit | Attending: Vascular Surgery | Admitting: Vascular Surgery

## 2022-05-27 ENCOUNTER — Ambulatory Visit (INDEPENDENT_AMBULATORY_CARE_PROVIDER_SITE_OTHER): Payer: 59 | Admitting: Vascular Surgery

## 2022-05-27 ENCOUNTER — Encounter: Payer: Self-pay | Admitting: Vascular Surgery

## 2022-05-27 VITALS — BP 192/69 | HR 66 | Temp 97.5°F | Resp 20 | Ht 65.0 in | Wt 155.0 lb

## 2022-05-27 DIAGNOSIS — Z992 Dependence on renal dialysis: Secondary | ICD-10-CM | POA: Diagnosis not present

## 2022-05-27 DIAGNOSIS — N186 End stage renal disease: Secondary | ICD-10-CM | POA: Diagnosis present

## 2022-05-27 DIAGNOSIS — T82590A Other mechanical complication of surgically created arteriovenous fistula, initial encounter: Secondary | ICD-10-CM

## 2022-05-27 NOTE — Progress Notes (Signed)
Office Note     CC: Concern for steal syndrome Requesting Provider:  Madelon Lips, MD  HPI: Shannon Caldwell is a 51 y.o. (10-23-1970) female presenting concern for steal syndrome.  Exam today, Shannon Caldwell was doing well.  She is excited to spend Christmas with her sister in [place]. Shannon Caldwell is currently being dialyzed through a TDC.  She has had her right arm AV graft accessed, and stated it was working well until recently when it was accessed and upon decannulation a large hematoma developed.  At the time, the decision was made to and dialyzed through a TDC.  Following the hematoma, Shannon Caldwell began appreciating in the right hand radiating from the forearm down to the middle digit.  She denies wounds on the fingertips, rest pain.   Past Medical History:  Diagnosis Date   Abnormal vaginal bleeding    Anemia    Blood transfusion    Cervical pain (neck)    due to abscess    Diabetes mellitus, type 2 (HCC)    changed to type 1   Generalized headaches    GERD (gastroesophageal reflux disease)    Hyperlipidemia    Hypertension    Renal failure, chronic, stage 4 (severe) ([place]) 07/07/2020    Past Surgical History:  Procedure Laterality Date   APPENDECTOMY     AV FISTULA PLACEMENT Right 04/18/2022   Procedure: INSERTION OF ARTERIOVENOUS (AV) GORE-TEX GRAFT RIGHT ARM;  Surgeon: [name], MD;  Location: [facility] OR;  Service: Vascular;  Laterality: Right;   CARDIAC CATHETERIZATION     CHOLECYSTECTOMY     cytocyst removal     HERNIA REPAIR  August 2000, August 2130   umbilical hernia    pancreatic debridgement      Social History   Socioeconomic History   Marital status: Single    Spouse name: Not on file   Number of children: 3   Years of education: Not on file   Highest education level: Not on file  Occupational History   Occupation: nursing student    Employer: [employer]   Occupation: med Designer, multimedia    Comment: [employer] [place] on weekends   Occupation: [employer]    Comment: during week  Tobacco Use   Smoking status: Never   Smokeless tobacco: Never  Vaping Use   Vaping Use: Never used  Substance and Sexual Activity   Alcohol use: No   Drug use: No   Sexual activity: Not on file  Other Topics Concern   Not on file  Social History Narrative   Daughter 71, son 83, son 49   Grandson lives with Patient   Social Determinants of Health   Financial Resource Strain: Not on file  Food Insecurity: No Food Insecurity (04/18/2022)   Hunger Vital Sign    Worried About Running Out of Food in the Last Year: Never true    Ran Out of Food in the Last Year: Never true  Transportation Needs: Not on file  Physical Activity: Not on file  Stress: Not on file  Social Connections: Not on file  Intimate Partner Violence: Not on file   Family History  Problem Relation Age of Onset   Hypertension Mother        grandfather   Diabetes Mother        grandmother   Stroke Other        grandparent   Lung cancer Maternal Grandfather     Current Outpatient Medications  Medication Sig Dispense Refill  albuterol (VENTOLIN HFA) 108 (90 Base) MCG/ACT inhaler Inhale 2 puffs into the lungs every 6 (six) hours as needed for wheezing or shortness of breath.     atorvastatin (LIPITOR) 20 MG tablet Take 20 mg by mouth daily.     cloNIDine (CATAPRES) 0.2 MG tablet Take 0.2 mg by mouth 2 (two) times daily.     metoprolol succinate (TOPROL-XL) 50 MG 24 hr tablet Take 50 mg by mouth at bedtime.     NIFEdipine (ADALAT CC) 90 MG 24 hr tablet Take 90 mg by mouth daily.     oxyCODONE-acetaminophen (PERCOCET/ROXICET) 5-325 MG tablet Take 1 tablet by mouth every 6 (six) hours as needed for moderate pain. 20 tablet 0   Pancrelipase, Lip-Prot-Amyl, (ZENPEP) 5000-24000 units CPEP Take 1 capsule by mouth daily.     pantoprazole (PROTONIX) 40 MG tablet Take 40 mg by mouth daily.     sevelamer carbonate (RENVELA) 800 MG tablet Take 800 mg by mouth 3 (three) times daily  with meals.     spironolactone (ALDACTONE) 25 MG tablet Take 25 mg by mouth daily.     torsemide (DEMADEX) 20 MG tablet Take 20 mg by mouth daily.     No current facility-administered medications for this visit.    Allergies  Allergen Reactions   Amlodipine Shortness Of Breath and Swelling   Latex Itching   Morphine Hives and Itching   Shellfish-Derived Products Itching and Swelling     REVIEW OF SYSTEMS:  [X]  denotes positive finding, [ ]  denotes negative finding Cardiac  Comments:  Chest pain or chest pressure:    Shortness of breath upon exertion:    Short of breath when lying flat:    Irregular heart rhythm:        Vascular    Pain in calf, thigh, or hip brought on by ambulation:    Pain in feet at night that wakes you up from your sleep:     Blood clot in your veins:    Leg swelling:         Pulmonary    Oxygen at home:    Productive cough:     Wheezing:         Neurologic    Sudden weakness in arms or legs:     Sudden numbness in arms or legs:     Sudden onset of difficulty speaking or slurred speech:    Temporary loss of vision in one eye:     Problems with dizziness:         Gastrointestinal    Blood in stool:     Vomited blood:         Genitourinary    Burning when urinating:     Blood in urine:        Psychiatric    Major depression:         Hematologic    Bleeding problems:    Problems with blood clotting too easily:        Skin    Rashes or ulcers:        Constitutional    Fever or chills:      PHYSICAL EXAMINATION:  Vitals:   05/27/22 0930  BP: (!) 192/69  Pulse: 66  Resp: 20  Temp: (!) 97.5 F (36.4 C)  SpO2: 97%  Weight: 155 lb (70.3 kg)  Height: 5\' 5"  (1.651 m)    General:  WDWN in NAD; vital signs documented above Gait: Not observed HENT: WNL, normocephalic Pulmonary: normal non-labored  breathing , without wheezing Cardiac: regular HR Abdomen: soft, NT, no masses Skin: without rashes Vascular Exam/Pulses:  Right  Left  Radial 2+ (normal) 2+ (normal)                       Extremities: without ischemic changes, without Gangrene , without cellulitis; without open wounds;  Musculoskeletal: no muscle wasting or atrophy  Neurologic: A&O X 3;  No focal weakness or paresthesias are detected Psychiatric:  The pt has Normal affect.   Non-Invasive Vascular Imaging:   Right radial systolic blood pressure augments with compression of the fistula-this is a normal finding.    ASSESSMENT/PLAN: Shannon Caldwell is a 51 y.o. female presenting with concern for steal syndrome of right arm AV graft.  Noninvasive duplex ultrasound of the right arm demonstrates normal blood pressure augmentation at the level of the radial artery with fistula compression.  On physical exam, Shannon Caldwell has a palpable radial pulse.  There is an excellent thrill in her fistula. The right-sided hematoma, while no longer visible, moved her AV graft in the subcutaneous plane - there is now a curve at the mid humerus.  I called Shannon Caldwell several weeks ago in follow-up to ensure she was doing okay status post fistula creation.  At that time, she was doing well, without any concern.  She stated her fistula had an excellent thrill, with no symptoms of paresthesias in the hand.  Being that these paresthesias developed after access which resulted in a large hematoma, I think the most likely etiology of her paraesthesias is from median nerve irritation from hematoma formation.    Shannon Caldwell and I discussed the above.  Ligation of her fistula would not improve her symptoms as she has a palpable pulse at her wrist.  Recommend reattempting right arm HD access.  Should prolonged bleeding occur after cannulation, I am happy to move forward with a fistulogram, but currently there is a nice thrill so I will hold off at this time.    [name], MD [facility] [phone]

## 2022-08-20 DIAGNOSIS — R768 Other specified abnormal immunological findings in serum: Secondary | ICD-10-CM | POA: Insufficient documentation

## 2022-08-26 DIAGNOSIS — E872 Acidosis, unspecified: Secondary | ICD-10-CM | POA: Insufficient documentation

## 2022-08-26 DIAGNOSIS — E875 Hyperkalemia: Secondary | ICD-10-CM | POA: Insufficient documentation

## 2022-09-12 ENCOUNTER — Other Ambulatory Visit: Payer: Self-pay | Admitting: *Deleted

## 2022-09-12 DIAGNOSIS — N186 End stage renal disease: Secondary | ICD-10-CM

## 2022-09-13 ENCOUNTER — Ambulatory Visit: Payer: Medicare Other

## 2022-09-13 ENCOUNTER — Ambulatory Visit (HOSPITAL_COMMUNITY): Admission: RE | Admit: 2022-09-13 | Payer: Medicare Other | Source: Ambulatory Visit

## 2022-09-27 ENCOUNTER — Encounter: Payer: Self-pay | Admitting: Vascular Surgery

## 2022-09-27 ENCOUNTER — Ambulatory Visit (INDEPENDENT_AMBULATORY_CARE_PROVIDER_SITE_OTHER): Payer: 59 | Admitting: Physician Assistant

## 2022-09-27 ENCOUNTER — Ambulatory Visit (HOSPITAL_COMMUNITY)
Admission: RE | Admit: 2022-09-27 | Discharge: 2022-09-27 | Disposition: A | Discharge status: Home / Self Care | Payer: 59 | Source: Ambulatory Visit | Attending: Physician Assistant | Admitting: Physician Assistant

## 2022-09-27 VITALS — BP 185/79 | HR 74 | Temp 98.0°F | Ht 65.0 in | Wt 164.2 lb

## 2022-09-27 DIAGNOSIS — N186 End stage renal disease: Secondary | ICD-10-CM | POA: Insufficient documentation

## 2022-09-27 DIAGNOSIS — Z992 Dependence on renal dialysis: Secondary | ICD-10-CM | POA: Insufficient documentation

## 2022-09-27 NOTE — Progress Notes (Signed)
POST OPERATIVE OFFICE NOTE    CC:  F/u for surgery  HPI:  This is a 52 y.o. female who is s/p right UE AV graft on 04/18/22 by Dr. Karin Lieu.  She is here today for reported prolonged bleeding.   She does HD at home and states prolonged bleeding started 2 months ago.  She denies symptoms of steal or fevers with chills.       Allergies  Allergen Reactions   Amlodipine Shortness Of Breath and Swelling   Latex Itching   Morphine Hives and Itching   Shellfish-Derived Products Itching and Swelling    Current Outpatient Medications  Medication Sig Dispense Refill   albuterol (VENTOLIN HFA) 108 (90 Base) MCG/ACT inhaler Inhale 2 puffs into the lungs every 6 (six) hours as needed for wheezing or shortness of breath.     atorvastatin (LIPITOR) 20 MG tablet Take 20 mg by mouth daily.     cloNIDine (CATAPRES) 0.2 MG tablet Take 0.2 mg by mouth 2 (two) times daily.     metoprolol succinate (TOPROL-XL) 50 MG 24 hr tablet Take 50 mg by mouth at bedtime.     NIFEdipine (ADALAT CC) 90 MG 24 hr tablet Take 90 mg by mouth daily.     oxyCODONE-acetaminophen (PERCOCET/ROXICET) 5-325 MG tablet Take 1 tablet by mouth every 6 (six) hours as needed for moderate pain. 20 tablet 0   Pancrelipase, Lip-Prot-Amyl, (ZENPEP) 5000-24000 units CPEP Take 1 capsule by mouth daily.     pantoprazole (PROTONIX) 40 MG tablet Take 40 mg by mouth daily.     sevelamer carbonate (RENVELA) 800 MG tablet Take 800 mg by mouth 3 (three) times daily with meals.     spironolactone (ALDACTONE) 25 MG tablet Take 25 mg by mouth daily.     torsemide (DEMADEX) 20 MG tablet Take 20 mg by mouth daily.     No current facility-administered medications for this visit.     ROS:  See HPI  Physical Exam:   Findings:       +--------------------+----------+-----------------+--------+  AVG                PSV (cm/s)Flow Vol (mL/min)Describe  +--------------------+----------+-----------------+--------+  Native artery inflow    243          2089                 +--------------------+----------+-----------------+--------+  Arterial anastomosis   495                               +--------------------+----------+-----------------+--------+  Prox graft             165                               +--------------------+----------+-----------------+--------+  Mid graft              191                               +--------------------+----------+-----------------+--------+  Distal graft           173                               +--------------------+----------+-----------------+--------+  Venous anastomosis     609  stenotic  +--------------------+----------+-----------------+--------+  Venous outflow         624                     stenotic  +--------------------+----------+-----------------+--------+      Summary:  Arteriovenous graft-Stenosis noted.   Incision:  well healed Extremities:  palpable radial pulse Neuro: sensation intact B UE Abdomen:  soft Lungs non labored breathing    Assessment/Plan:  This is a 52 y.o. female who is s/p: Left UE AV graft placed on 04/18/22 by Dr. 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   She has had episodes of prolonged bleeding post HD with painful HD sticks.  The inflow vol. Is excellent.    -She will be scheduled for angiogram of her right UE AV graft with intervention to treat the stenosis and prevent occlusion of the AV graft.  With increasing stenosis the av graft is threatened.  She is not on anticoagulation and she dose home HD so the intervention day can be first available and she can change her HD days at home.     Willa Rough. Thomasena Edis, [facility] [phone]   Clinic MD:  Lucretia Field

## 2022-09-27 NOTE — H&P (View-Only) (Signed)
POST OPERATIVE OFFICE NOTE    CC:  F/u for surgery  HPI:  This is a 51 y.o. female who is s/p right UE AV graft on 04/18/22 by Dr. Robins.  She is here today for reported prolonged bleeding.   She does HD at home and states prolonged bleeding started 2 months ago.  She denies symptoms of steal or fevers with chills.       Allergies  Allergen Reactions   Amlodipine Shortness Of Breath and Swelling   Latex Itching   Morphine Hives and Itching   Shellfish-Derived Products Itching and Swelling    Current Outpatient Medications  Medication Sig Dispense Refill   albuterol (VENTOLIN HFA) 108 (90 Base) MCG/ACT inhaler Inhale 2 puffs into the lungs every 6 (six) hours as needed for wheezing or shortness of breath.     atorvastatin (LIPITOR) 20 MG tablet Take 20 mg by mouth daily.     cloNIDine (CATAPRES) 0.2 MG tablet Take 0.2 mg by mouth 2 (two) times daily.     metoprolol succinate (TOPROL-XL) 50 MG 24 hr tablet Take 50 mg by mouth at bedtime.     NIFEdipine (ADALAT CC) 90 MG 24 hr tablet Take 90 mg by mouth daily.     oxyCODONE-acetaminophen (PERCOCET/ROXICET) 5-325 MG tablet Take 1 tablet by mouth every 6 (six) hours as needed for moderate pain. 20 tablet 0   Pancrelipase, Lip-Prot-Amyl, (ZENPEP) 5000-24000 units CPEP Take 1 capsule by mouth daily.     pantoprazole (PROTONIX) 40 MG tablet Take 40 mg by mouth daily.     sevelamer carbonate (RENVELA) 800 MG tablet Take 800 mg by mouth 3 (three) times daily with meals.     spironolactone (ALDACTONE) 25 MG tablet Take 25 mg by mouth daily.     torsemide (DEMADEX) 20 MG tablet Take 20 mg by mouth daily.     No current facility-administered medications for this visit.     ROS:  See HPI  Physical Exam:   Findings:       +--------------------+----------+-----------------+--------+  AVG                PSV (cm/s)Flow Vol (mL/min)Describe  +--------------------+----------+-----------------+--------+  Native artery inflow    243          2089                 +--------------------+----------+-----------------+--------+  Arterial anastomosis   495                               +--------------------+----------+-----------------+--------+  Prox graft             165                               +--------------------+----------+-----------------+--------+  Mid graft              191                               +--------------------+----------+-----------------+--------+  Distal graft           173                               +--------------------+----------+-----------------+--------+  Venous anastomosis     609                       stenotic  +--------------------+----------+-----------------+--------+  Venous outflow         624                     stenotic  +--------------------+----------+-----------------+--------+      Summary:  Arteriovenous graft-Stenosis noted.   Incision:  well healed Extremities:  palpable radial pulse Neuro: sensation intact B UE Abdomen:  soft Lungs non labored breathing    Assessment/Plan:  This is a 51 y.o. female who is s/p: Left UE AV graft placed on 04/18/22 by Dr. 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   She has had episodes of prolonged bleeding post HD with painful HD sticks.  The inflow vol. Is excellent.    -She will be scheduled for angiogram of her right UE AV graft with intervention to treat the stenosis and prevent occlusion of the AV graft.  With increasing stenosis the av graft is threatened.  She is not on anticoagulation and she dose home HD so the intervention day can be first available and she can change her HD days at home.     Fidelia Cathers M. Sharnette Kitamura, PAC [facility] [phone]   Clinic MD:  [place] 

## 2022-09-28 ENCOUNTER — Other Ambulatory Visit: Payer: Self-pay

## 2022-09-28 DIAGNOSIS — T82590A Other mechanical complication of surgically created arteriovenous fistula, initial encounter: Secondary | ICD-10-CM

## 2022-09-28 DIAGNOSIS — N186 End stage renal disease: Secondary | ICD-10-CM

## 2022-09-28 MED ORDER — SODIUM CHLORIDE 0.9% FLUSH: 3.0000 mL | Freq: Two times a day (BID) | INTRAVENOUS | Status: AC

## 2022-09-28 MED ORDER — SODIUM CHLORIDE 0.9 % IV SOLN
250.0000 mL | INTRAVENOUS | Status: AC | PRN
Start: 2022-09-28 — End: ?

## 2022-09-30 ENCOUNTER — Encounter (HOSPITAL_COMMUNITY)
Admission: RE | Disposition: A | Discharge status: Home / Self Care | Payer: Self-pay | Source: Home / Self Care | Attending: Vascular Surgery

## 2022-09-30 ENCOUNTER — Ambulatory Visit (HOSPITAL_COMMUNITY)
Admission: RE | Admit: 2022-09-30 | Discharge: 2022-09-30 | Disposition: A | Discharge status: Home / Self Care | Payer: 59 | Attending: Vascular Surgery | Admitting: Vascular Surgery

## 2022-09-30 ENCOUNTER — Other Ambulatory Visit: Payer: Self-pay

## 2022-09-30 DIAGNOSIS — T82898A Other specified complication of vascular prosthetic devices, implants and grafts, initial encounter: Secondary | ICD-10-CM

## 2022-09-30 DIAGNOSIS — Z992 Dependence on renal dialysis: Secondary | ICD-10-CM | POA: Insufficient documentation

## 2022-09-30 DIAGNOSIS — N186 End stage renal disease: Secondary | ICD-10-CM | POA: Insufficient documentation

## 2022-09-30 DIAGNOSIS — N185 Chronic kidney disease, stage 5: Secondary | ICD-10-CM

## 2022-09-30 DIAGNOSIS — Y841 Kidney dialysis as the cause of abnormal reaction of the patient, or of later complication, without mention of misadventure at the time of the procedure: Secondary | ICD-10-CM | POA: Diagnosis not present

## 2022-09-30 DIAGNOSIS — T82858A Stenosis of vascular prosthetic devices, implants and grafts, initial encounter: Secondary | ICD-10-CM | POA: Insufficient documentation

## 2022-09-30 DIAGNOSIS — T82590A Other mechanical complication of surgically created arteriovenous fistula, initial encounter: Secondary | ICD-10-CM

## 2022-09-30 HISTORY — PX: A/V FISTULAGRAM: CATH118298

## 2022-09-30 LAB — POCT I-STAT, CHEM 8
BUN: 56 mg/dL — ABNORMAL HIGH (ref 6–20)
Calcium, Ion: 1.04 mmol/L — ABNORMAL LOW (ref 1.15–1.40)
Chloride: 95 mmol/L — ABNORMAL LOW (ref 98–111)
Creatinine, Ser: 11.3 mg/dL — ABNORMAL HIGH (ref 0.44–1.00)
Glucose, Bld: 126 mg/dL — ABNORMAL HIGH (ref 70–99)
HCT: 25 % — ABNORMAL LOW (ref 36.0–46.0)
Hemoglobin: 8.5 g/dL — ABNORMAL LOW (ref 12.0–15.0)
Potassium: 4.8 mmol/L (ref 3.5–5.1)
Sodium: 137 mmol/L (ref 135–145)
TCO2: 29 mmol/L (ref 22–32)

## 2022-09-30 SURGERY — A/V FISTULAGRAM
Anesthesia: LOCAL | Laterality: Right

## 2022-09-30 MED ORDER — FENTANYL CITRATE (PF) 100 MCG/2ML IJ SOLN
INTRAMUSCULAR | Status: DC | PRN
  Administered 2022-09-30 (×2): 50 ug via INTRAVENOUS

## 2022-09-30 MED ORDER — SODIUM CHLORIDE 0.9% FLUSH: 3.0000 mL | INTRAVENOUS | Status: DC | PRN

## 2022-09-30 MED ORDER — LIDOCAINE HCL (PF) 1 % IJ SOLN
INTRAMUSCULAR | Status: DC | PRN
  Administered 2022-09-30: 5 mL

## 2022-09-30 MED ORDER — MIDAZOLAM HCL 2 MG/2ML IJ SOLN
INTRAMUSCULAR | Status: DC | PRN
  Administered 2022-09-30: 1 mg via INTRAVENOUS

## 2022-09-30 MED ORDER — IODIXANOL 320 MG/ML IV SOLN
INTRAVENOUS | Status: DC | PRN
  Administered 2022-09-30: 25 mL

## 2022-09-30 MED ORDER — HEPARIN (PORCINE) IN NACL 1000-0.9 UT/500ML-% IV SOLN
INTRAVENOUS | Status: DC | PRN
  Administered 2022-09-30: 500 mL

## 2022-09-30 MED ORDER — FENTANYL CITRATE (PF) 100 MCG/2ML IJ SOLN
INTRAMUSCULAR | Status: AC
  Filled 2022-09-30: qty 2

## 2022-09-30 MED ORDER — LIDOCAINE HCL (PF) 1 % IJ SOLN
INTRAMUSCULAR | Status: AC
  Filled 2022-09-30: qty 30

## 2022-09-30 MED ORDER — MIDAZOLAM HCL 2 MG/2ML IJ SOLN
INTRAMUSCULAR | Status: AC
  Filled 2022-09-30: qty 2

## 2022-09-30 SURGICAL SUPPLY — 10 items
BALLN MUSTANG 7.0X40 75 (BALLOONS) ×1
BALLOON MUSTANG 7.0X40 75 (BALLOONS) IMPLANT
KIT ENCORE 26 ADVANTAGE (KITS) IMPLANT
KIT MICROPUNCTURE NIT STIFF (SHEATH) IMPLANT
KIT PV (KITS) ×1 IMPLANT
SHEATH PINNACLE R/O II 6F 4CM (SHEATH) IMPLANT
SYR MEDRAD MARK 7 150ML (SYRINGE) ×1 IMPLANT
TRANSDUCER W/STOPCOCK (MISCELLANEOUS) ×1 IMPLANT
TRAY PV CATH (CUSTOM PROCEDURE TRAY) ×1 IMPLANT
WIRE BENTSON .035X145CM (WIRE) IMPLANT

## 2022-09-30 NOTE — Op Note (Signed)
DATE OF SERVICE: 09/30/2022  PATIENT:  Shannon Caldwell  52 y.o. female  PRE-OPERATIVE DIAGNOSIS:  ESRD  POST-OPERATIVE DIAGNOSIS:  Same  PROCEDURE:   1) ultrasound guided access - right arteriovenous graft 2) fistulagram 3) angioplasty of venous anastomosis of right arteriovenous graft (7x40mm Mustang) 4) conscious sedation (10 minutes)  SURGEON:  Surgeon(s) and Role:    * [name], MD - Primary  ASSISTANT: none  ANESTHESIA:   local and IV sedation  EBL: minimal  BLOOD ADMINISTERED:none  DRAINS: none   LOCAL MEDICATIONS USED:  LIDOCAINE   SPECIMEN:  none  COUNTS: confirmed correct.  TOURNIQUET:  none  PATIENT DISPOSITION:  PACU - hemodynamically stable.   Delay start of Pharmacological VTE agent (>24hrs) due to surgical blood loss or risk of bleeding: no  INDICATION FOR PROCEDURE: Shannon Caldwell is a 52 y.o. female with right arm graft with prolonged bleeding after decannulation. After careful discussion of risks, benefits, and alternatives the patient was offered fistulagram. The patient understood and wished to proceed.  OPERATIVE FINDINGS: venous anastomotic stenosis. Complete resolution after angioplasty. Otherwise normal fistulagram.  DESCRIPTION OF PROCEDURE: After identification of the patient in the pre-operative holding area, the patient was transferred to the operating room. The patient was positioned supine on the operating room table. Anesthesia was induced. The right arm was prepped and draped in standard fashion. A surgical pause was performed confirming correct patient, procedure, and operative location.  Using intraoperative ultrasound, the graft was cannulated with micropuncture technique.  A micro sheath was introduced into the fistula.  Fistulogram was performed in stations.  Central segment was totally normal.  There was venous anastomotic stenosis noted.  The graft was somewhat tortuous throughout the arm.  The arterial anastomosis appeared normal  on reflux angiography.  Angioplasty was performed of the venous anastomosis with a 7 x 40 mm Mustang balloon.  Good technical result was achieved on follow-up angiogram.  All endovascular equipment was removed.  A figure-of-eight stitch was placed at the access site.  Manual pressure was held.  Hemostasis was achieved.  The arm was wrapped with gentle compression.  Conscious sedation was administered with the use of IV fentanyl and midazolam under continuous physician and nurse monitoring.  Heart rate, blood pressure, and oxygen saturation were continuously monitored.  Total sedation time was 10 minutes  Upon completion of the case instrument and sharps counts were confirmed correct. The patient was transferred to the  PACU in good condition. I was present for all portions of the procedure.  FOLLOW UP PLAN: Assuming a normal postoperative course, the patient can follow up with [place] on an as-needed basis.   Rande Brunt. Lenell Antu, MD [facility] Phone Number: [phone] 09/30/2022 8:05 AM

## 2022-09-30 NOTE — Interval H&P Note (Signed)
History and Physical Interval Note:  09/30/2022 8:04 AM  Shannon Caldwell  has presented today for surgery, with the diagnosis of av graft malfunction,  end stage renal disease.  The various methods of treatment have been discussed with the patient and family. After consideration of risks, benefits and other options for treatment, the patient has consented to  Procedure(s): Upper Extremity Angiography (Right) as a surgical intervention.  The patient's history has been reviewed, patient examined, no change in status, stable for surgery.  I have reviewed the patient's chart and labs.  Questions were answered to the patient's satisfaction.     [name]

## 2022-10-03 ENCOUNTER — Encounter (HOSPITAL_COMMUNITY): Payer: Self-pay | Admitting: Vascular Surgery

## 2022-10-10 ENCOUNTER — Other Ambulatory Visit: Payer: Self-pay | Admitting: Physician Assistant

## 2022-11-19 ENCOUNTER — Encounter (HOSPITAL_COMMUNITY): Payer: Self-pay | Admitting: Emergency Medicine

## 2022-11-19 ENCOUNTER — Other Ambulatory Visit: Payer: Self-pay

## 2022-11-19 ENCOUNTER — Emergency Department (HOSPITAL_COMMUNITY): Payer: 59

## 2022-11-19 ENCOUNTER — Observation Stay (HOSPITAL_COMMUNITY)
Admission: EM | Admit: 2022-11-19 | Discharge: 2022-11-19 | Disposition: A | Discharge status: Home / Self Care | Payer: 59 | Attending: Internal Medicine | Admitting: Internal Medicine

## 2022-11-19 DIAGNOSIS — E1122 Type 2 diabetes mellitus with diabetic chronic kidney disease: Secondary | ICD-10-CM | POA: Insufficient documentation

## 2022-11-19 DIAGNOSIS — Z79899 Other long term (current) drug therapy: Secondary | ICD-10-CM | POA: Diagnosis not present

## 2022-11-19 DIAGNOSIS — R0602 Shortness of breath: Secondary | ICD-10-CM | POA: Diagnosis present

## 2022-11-19 DIAGNOSIS — E119 Type 2 diabetes mellitus without complications: Secondary | ICD-10-CM

## 2022-11-19 DIAGNOSIS — E785 Hyperlipidemia, unspecified: Secondary | ICD-10-CM | POA: Diagnosis present

## 2022-11-19 DIAGNOSIS — I10 Essential (primary) hypertension: Secondary | ICD-10-CM | POA: Diagnosis present

## 2022-11-19 DIAGNOSIS — D631 Anemia in chronic kidney disease: Secondary | ICD-10-CM | POA: Diagnosis not present

## 2022-11-19 DIAGNOSIS — I160 Hypertensive urgency: Secondary | ICD-10-CM | POA: Insufficient documentation

## 2022-11-19 DIAGNOSIS — E8770 Fluid overload, unspecified: Principal | ICD-10-CM

## 2022-11-19 DIAGNOSIS — I120 Hypertensive chronic kidney disease with stage 5 chronic kidney disease or end stage renal disease: Secondary | ICD-10-CM | POA: Insufficient documentation

## 2022-11-19 DIAGNOSIS — Z91040 Latex allergy status: Secondary | ICD-10-CM | POA: Insufficient documentation

## 2022-11-19 DIAGNOSIS — N289 Disorder of kidney and ureter, unspecified: Secondary | ICD-10-CM

## 2022-11-19 DIAGNOSIS — Z992 Dependence on renal dialysis: Secondary | ICD-10-CM | POA: Insufficient documentation

## 2022-11-19 DIAGNOSIS — R7989 Other specified abnormal findings of blood chemistry: Secondary | ICD-10-CM | POA: Insufficient documentation

## 2022-11-19 DIAGNOSIS — R110 Nausea: Secondary | ICD-10-CM

## 2022-11-19 DIAGNOSIS — N186 End stage renal disease: Secondary | ICD-10-CM | POA: Insufficient documentation

## 2022-11-19 DIAGNOSIS — R112 Nausea with vomiting, unspecified: Secondary | ICD-10-CM | POA: Diagnosis present

## 2022-11-19 DIAGNOSIS — D649 Anemia, unspecified: Secondary | ICD-10-CM

## 2022-11-19 HISTORY — DX: Anemia in chronic kidney disease: D63.1

## 2022-11-19 HISTORY — DX: Chronic kidney disease, unspecified: N18.9

## 2022-11-19 LAB — COMPREHENSIVE METABOLIC PANEL
ALT: 26 U/L (ref 0–44)
AST: 21 U/L (ref 15–41)
Albumin: 4.2 g/dL (ref 3.5–5.0)
Alkaline Phosphatase: 79 U/L (ref 38–126)
Anion gap: 23 — ABNORMAL HIGH (ref 5–15)
BUN: 89 mg/dL — ABNORMAL HIGH (ref 6–20)
CO2: 21 mmol/L — ABNORMAL LOW (ref 22–32)
Calcium: 9.3 mg/dL (ref 8.9–10.3)
Chloride: 94 mmol/L — ABNORMAL LOW (ref 98–111)
Creatinine, Ser: 15.18 mg/dL — ABNORMAL HIGH (ref 0.44–1.00)
GFR, Estimated: 3 mL/min — ABNORMAL LOW (ref >60)
Glucose, Bld: 100 mg/dL — ABNORMAL HIGH (ref 70–99)
Potassium: 4.6 mmol/L (ref 3.5–5.1)
Sodium: 138 mmol/L (ref 135–145)
Total Bilirubin: 1.2 mg/dL (ref 0.3–1.2)
Total Protein: 8 g/dL (ref 6.5–8.1)

## 2022-11-19 LAB — URINALYSIS, ROUTINE W REFLEX MICROSCOPIC
Bacteria, UA: NONE SEEN
Bilirubin Urine: NEGATIVE
Glucose, UA: 50 mg/dL — AB
Hgb urine dipstick: NEGATIVE
Ketones, ur: NEGATIVE mg/dL
Leukocytes,Ua: NEGATIVE
Nitrite: NEGATIVE
Protein, ur: >=300 mg/dL — AB
Specific Gravity, Urine: 1.012 (ref 1.005–1.030)
pH: 7 (ref 5.0–8.0)

## 2022-11-19 LAB — ABO/RH: ABO/RH(D): A POS

## 2022-11-19 LAB — RETIC PANEL
Immature Retic Fract: 38.2 % — ABNORMAL HIGH (ref 2.3–15.9)
RBC.: 2.32 MIL/uL — ABNORMAL LOW (ref 3.87–5.11)
Retic Count, Absolute: 94.7 10*3/uL (ref 19.0–186.0)
Retic Ct Pct: 4.1 % — ABNORMAL HIGH (ref 0.4–3.1)
Reticulocyte Hemoglobin: 29.5 pg (ref >27.9)

## 2022-11-19 LAB — TROPONIN I (HIGH SENSITIVITY)
Troponin I (High Sensitivity): 35 ng/L — ABNORMAL HIGH (ref <18)
Troponin I (High Sensitivity): 32 ng/L — ABNORMAL HIGH (ref <18)

## 2022-11-19 LAB — LIPASE, BLOOD: Lipase: 31 U/L (ref 11–51)

## 2022-11-19 LAB — TYPE AND SCREEN
Unit division: 0
Unit division: 0

## 2022-11-19 LAB — IRON AND TIBC
Iron: 45 ug/dL (ref 28–170)
Saturation Ratios: 17 % (ref 10.4–31.8)
TIBC: 263 ug/dL (ref 250–450)
UIBC: 218 ug/dL

## 2022-11-19 LAB — CBC
HCT: 23.1 % — ABNORMAL LOW (ref 36.0–46.0)
Hemoglobin: 7 g/dL — ABNORMAL LOW (ref 12.0–15.0)
MCH: 30.2 pg (ref 26.0–34.0)
MCHC: 30.3 g/dL (ref 30.0–36.0)
MCV: 99.6 fL (ref 80.0–100.0)
Platelets: 398 10*3/uL (ref 150–400)
RBC: 2.32 MIL/uL — ABNORMAL LOW (ref 3.87–5.11)
RDW: 16.4 % — ABNORMAL HIGH (ref 11.5–15.5)
WBC: 6.9 10*3/uL (ref 4.0–10.5)
nRBC: 1.3 % — ABNORMAL HIGH (ref 0.0–0.2)

## 2022-11-19 LAB — BPAM RBC

## 2022-11-19 LAB — HEPATITIS B SURFACE ANTIGEN: Hepatitis B Surface Ag: NONREACTIVE

## 2022-11-19 LAB — I-STAT BETA HCG BLOOD, ED (MC, WL, AP ONLY)
I-stat hCG, quantitative: <5 m[IU]/mL (ref <5)
I-stat hCG, quantitative: <5 m[IU]/mL (ref <5)

## 2022-11-19 LAB — FERRITIN: Ferritin: 6630 ng/mL — ABNORMAL HIGH (ref 11–307)

## 2022-11-19 MED ORDER — ATORVASTATIN CALCIUM 40 MG PO TABS: 20.0000 mg | ORAL_TABLET | Freq: Every day | ORAL | Status: DC

## 2022-11-19 MED ORDER — NIFEDIPINE ER OSMOTIC RELEASE 60 MG PO TB24: 90.0000 mg | ORAL_TABLET | Freq: Two times a day (BID) | ORAL | Status: DC

## 2022-11-19 MED ORDER — LIDOCAINE-PRILOCAINE 2.5-2.5 % EX CREA: 1.0000 | TOPICAL_CREAM | CUTANEOUS | Status: DC | PRN

## 2022-11-19 MED ORDER — SORBITOL 70 % SOLN: 30.0000 mL | Status: DC | PRN

## 2022-11-19 MED ORDER — CALCITRIOL 0.5 MCG PO CAPS: 0.5000 ug | ORAL_CAPSULE | Freq: Two times a day (BID) | ORAL | Status: DC

## 2022-11-19 MED ORDER — CAMPHOR-MENTHOL 0.5-0.5 % EX LOTN: 1.0000 | TOPICAL_LOTION | Freq: Three times a day (TID) | CUTANEOUS | Status: DC | PRN

## 2022-11-19 MED ORDER — HYDRALAZINE HCL 20 MG/ML IJ SOLN: 5.0000 mg | Freq: Once | INTRAMUSCULAR | Status: DC

## 2022-11-19 MED ORDER — HEPARIN SODIUM (PORCINE) 5000 UNIT/ML IJ SOLN: 5000.0000 [IU] | Freq: Three times a day (TID) | INTRAMUSCULAR | Status: DC

## 2022-11-19 MED ORDER — IRBESARTAN 300 MG PO TABS: 300.0000 mg | ORAL_TABLET | Freq: Every day | ORAL | Status: DC

## 2022-11-19 MED ORDER — HALOPERIDOL LACTATE 5 MG/ML IJ SOLN
2.0000 mg | Freq: Once | INTRAMUSCULAR | Status: AC
  Administered 2022-11-19: 2 mg via INTRAVENOUS
  Filled 2022-11-19: qty 1

## 2022-11-19 MED ORDER — ONDANSETRON HCL 4 MG/2ML IJ SOLN: 4.0000 mg | Freq: Four times a day (QID) | INTRAMUSCULAR | Status: DC | PRN

## 2022-11-19 MED ORDER — HEPARIN SODIUM (PORCINE) 1000 UNIT/ML DIALYSIS: 40.0000 [IU]/kg | INTRAMUSCULAR | Status: DC | PRN

## 2022-11-19 MED ORDER — TORSEMIDE 20 MG PO TABS: 100.0000 mg | ORAL_TABLET | Freq: Every day | ORAL | Status: DC

## 2022-11-19 MED ORDER — ACETAMINOPHEN 650 MG RE SUPP: 650.0000 mg | Freq: Four times a day (QID) | RECTAL | Status: DC | PRN

## 2022-11-19 MED ORDER — ALBUTEROL SULFATE HFA 108 (90 BASE) MCG/ACT IN AERS: 2.0000 | INHALATION_SPRAY | Freq: Four times a day (QID) | RESPIRATORY_TRACT | Status: DC | PRN

## 2022-11-19 MED ORDER — HYDRALAZINE HCL 20 MG/ML IJ SOLN
5.0000 mg | INTRAMUSCULAR | Status: DC | PRN
  Administered 2022-11-19: 5 mg via INTRAVENOUS
  Filled 2022-11-19: qty 1

## 2022-11-19 MED ORDER — ACETAMINOPHEN 325 MG PO TABS
650.0000 mg | ORAL_TABLET | Freq: Four times a day (QID) | ORAL | Status: DC | PRN
  Administered 2022-11-19: 650 mg via ORAL
  Filled 2022-11-19: qty 2

## 2022-11-19 MED ORDER — LIDOCAINE HCL (PF) 1 % IJ SOLN: 5.0000 mL | INTRAMUSCULAR | Status: DC | PRN

## 2022-11-19 MED ORDER — SODIUM CHLORIDE 0.9% FLUSH: 3.0000 mL | Freq: Two times a day (BID) | INTRAVENOUS | Status: DC

## 2022-11-19 MED ORDER — CHLORHEXIDINE GLUCONATE CLOTH 2 % EX PADS: 6.0000 | MEDICATED_PAD | Freq: Every day | CUTANEOUS | Status: DC

## 2022-11-19 MED ORDER — ONDANSETRON HCL 4 MG PO TABS: 4.0000 mg | ORAL_TABLET | Freq: Four times a day (QID) | ORAL | Status: DC | PRN

## 2022-11-19 MED ORDER — ZOLPIDEM TARTRATE 5 MG PO TABS: 5.0000 mg | ORAL_TABLET | Freq: Every evening | ORAL | Status: DC | PRN

## 2022-11-19 MED ORDER — METOPROLOL SUCCINATE ER 25 MG PO TB24: 50.0000 mg | ORAL_TABLET | Freq: Every day | ORAL | Status: DC

## 2022-11-19 MED ORDER — SEVELAMER CARBONATE 800 MG PO TABS: 800.0000 mg | ORAL_TABLET | Freq: Three times a day (TID) | ORAL | Status: DC

## 2022-11-19 MED ORDER — RENA-VITE PO TABS: 1.0000 | ORAL_TABLET | Freq: Every day | ORAL | Status: DC

## 2022-11-19 MED ORDER — PENTAFLUOROPROP-TETRAFLUOROETH EX AERO: 1.0000 | INHALATION_SPRAY | CUTANEOUS | Status: DC | PRN

## 2022-11-19 MED ORDER — CLONIDINE HCL 0.2 MG PO TABS: 0.2000 mg | ORAL_TABLET | Freq: Two times a day (BID) | ORAL | Status: DC

## 2022-11-19 MED ORDER — HYDROXYZINE HCL 25 MG PO TABS: 25.0000 mg | ORAL_TABLET | Freq: Three times a day (TID) | ORAL | Status: DC | PRN

## 2022-11-19 MED ORDER — PANCRELIPASE (LIP-PROT-AMYL) 5000-24000 UNITS PO CPEP: 1.0000 | ORAL_CAPSULE | Freq: Three times a day (TID) | ORAL | Status: DC

## 2022-11-19 MED ORDER — INSULIN ASPART 100 UNIT/ML IJ SOLN: 0.0000 [IU] | Freq: Three times a day (TID) | INTRAMUSCULAR | Status: DC

## 2022-11-19 MED ORDER — SPIRONOLACTONE 12.5 MG HALF TABLET: 50.0000 mg | ORAL_TABLET | Freq: Every day | ORAL | Status: DC

## 2022-11-19 MED ORDER — DOCUSATE SODIUM 283 MG RE ENEM: 1.0000 | ENEMA | RECTAL | Status: DC | PRN

## 2022-11-19 MED ORDER — NITROGLYCERIN 2 % TD OINT
1.0000 [in_us] | TOPICAL_OINTMENT | Freq: Once | TRANSDERMAL | Status: AC
  Administered 2022-11-19: 1 [in_us] via TOPICAL
  Filled 2022-11-19: qty 1

## 2022-11-19 MED ORDER — PANTOPRAZOLE SODIUM 40 MG PO TBEC: 40.0000 mg | DELAYED_RELEASE_TABLET | Freq: Every day | ORAL | Status: DC

## 2022-11-19 MED ORDER — ALBUTEROL SULFATE (2.5 MG/3ML) 0.083% IN NEBU: 2.5000 mg | INHALATION_SOLUTION | Freq: Four times a day (QID) | RESPIRATORY_TRACT | Status: DC | PRN

## 2022-11-19 NOTE — ED Notes (Signed)
Consent signed at bedside.  °

## 2022-11-19 NOTE — H&P (Signed)
History and Physical    Patient: Shannon Caldwell NWG:[id number] DOB: 07-07-1970 DOA: 11/19/2022 DOS: the patient was seen and examined on 11/19/2022 PCP: Pcp, No  Patient coming from: Home; NOK: Daughter, Shannon Caldwell, [phone]   Chief Complaint: SOB  HPI: Shannon Caldwell is a 52 y.o. female with medical history significant of anemia s/p transfusions, DM, HTN, HLD, and ESRD on home HD with AV graft placement on 4/26 presenting with SOB.  She missed her recent HD (does at home 4x weekly but none since Monday because she was out of town).  She reports that she dialyzed normally on Monday and they left town to go to [place] for an overnight trip (she was scheduled to work Wednesday).  She developed n/v on Tuesday and was too sick to travel and so has not been able to dialyze since.  She vomited as recently as this AM and was increasingly weak and so family brought her to the ER overnight.  She feels slightly better now, currently in HD.    She was SOB overnight.    ER Course:  Carryover, per Dr. Joneen Roach:  Patient has been out of town since Monday.  She has not dialyzed since leaving.  Not feeling well, endorses shortness of breath, nausea and vomiting.  She was noted to be 80% on room air, she has been placed on 2 L nasal cannula, is currently satting 98%.  Patient's blood pressure on presentation was 214/179.  No evidence of infection, chest x-ray unrevealing.  Additionally patient is anemic with hemoglobin of 7.0.  She has had a downward trend since 11/23.  Anemia panel and type and screen ordered      Review of Systems: As mentioned in the history of present illness. All other systems reviewed and are negative. Past Medical History:  Diagnosis Date   Abnormal vaginal bleeding    Anemia associated with chronic renal failure    s/p transfusions   Diabetes mellitus, type 2 (HCC)    changed to type 1   ESRD (end stage renal disease) on dialysis (HCC) 07/07/2020   Generalized headaches     GERD (gastroesophageal reflux disease)    Hyperlipidemia    Hypertension    Past Surgical History:  Procedure Laterality Date   A/V FISTULAGRAM Right 09/30/2022   Procedure: Upper Extremity Angiography;  Surgeon: [name], MD;  Location: [facility] INVASIVE CV LAB;  Service: Cardiovascular;  Laterality: Right;   APPENDECTOMY     AV FISTULA PLACEMENT Right 04/18/2022   Procedure: INSERTION OF ARTERIOVENOUS (AV) GORE-TEX GRAFT RIGHT ARM;  Surgeon: Victorino Sparrow, MD;  Location: [facility] OR;  Service: Vascular;  Laterality: Right;   CARDIAC CATHETERIZATION     CHOLECYSTECTOMY     cytocyst removal     HERNIA REPAIR  August 2000, August 1998   umbilical hernia    pancreatic debridgement     Social History:  reports that she has never smoked. She has never used smokeless tobacco. She reports that she does not drink alcohol and does not use drugs.  Allergies  Allergen Reactions   Amlodipine Shortness Of Breath and Swelling   Octacosanol Shortness Of Breath   Shellfish-Derived Products Itching and Swelling   Latex Itching   Morphine Hives and Itching    Family History  Problem Relation Age of Onset   Hypertension Mother        grandfather   Diabetes Mother        grandmother   Stroke Other  grandparent   Lung cancer Maternal Grandfather     Prior to Admission medications   Medication Sig Start Date End Date Taking? Authorizing Provider  albuterol (VENTOLIN HFA) 108 (90 Base) MCG/ACT inhaler Inhale 2 puffs into the lungs every 6 (six) hours as needed for wheezing or shortness of breath.   Yes [provider]  atorvastatin (LIPITOR) 20 MG tablet Take 20 mg by mouth daily. 03/08/22  Yes [provider]  calcitRIOL (ROCALTROL) 0.5 MCG capsule Take 0.5 mcg by mouth in the morning and at bedtime. 09/21/22  Yes [provider]  cloNIDine (CATAPRES) 0.2 MG tablet Take 0.2 mg by mouth 2 (two) times daily. 01/31/22 01/31/23 Yes [provider]   diphenhydrAMINE (BENADRYL) 25 mg capsule Take 25 mg by mouth daily as needed for allergies. 12/06/21  Yes [provider]  metoprolol succinate (TOPROL-XL) 50 MG 24 hr tablet Take 50 mg by mouth at bedtime.   Yes [provider]  multivitamin (RENA-VIT) TABS tablet Take 1 tablet by mouth daily.   Yes [provider]  NIFEdipine (ADALAT CC) 90 MG 24 hr tablet Take 90 mg by mouth 2 (two) times daily.   Yes [provider]  ondansetron (ZOFRAN) 4 MG tablet Take 4 mg by mouth every 8 (eight) hours as needed for nausea or vomiting.   Yes [provider]  Pancrelipase, Lip-Prot-Amyl, (ZENPEP) 5000-24000 units CPEP Take 1 capsule by mouth 3 (three) times daily with meals.   Yes [provider]  pantoprazole (PROTONIX) 40 MG tablet Take 40 mg by mouth daily.   Yes [provider]  sevelamer carbonate (RENVELA) 800 MG tablet Take 800 mg by mouth 3 (three) times daily with meals.   Yes [provider]  spironolactone (ALDACTONE) 50 MG tablet Take 50 mg by mouth daily.   Yes [provider]  telmisartan (MICARDIS) 80 MG tablet Take 80 mg by mouth at bedtime. 07/20/21  Yes [provider]  torsemide (DEMADEX) 100 MG tablet Take 100 mg by mouth at bedtime. 09/09/20  Yes [provider]  Methoxy PEG-Epoetin Beta (MIRCERA) 30 MCG/0.3ML SOSY Inject into the skin. 07/05/21   [provider]  mometasone (NASONEX) 50 MCG/ACT nasal spray Place 2 sprays into the nose daily. 01/25/11 08/26/11  Sheliah Hatch, MD    Physical Exam: Vitals:   11/19/22 0854 11/19/22 0857 11/19/22 0930 11/19/22 1000  BP: (!) 197/84 (!) 192/84 (!) 198/81 (!) 209/92  Pulse: 90 88 89 93  Resp: (!) 21 17 17 17   Temp: 98 F (36.7 C) 98 F (36.7 C)    TempSrc:      SpO2: 99% 99% 100% 100%  Weight: 73 kg      General:  Appears calm and comfortable and is in NAD; undergoing HD  Eyes:  EOMI, normal lids, iris ENT:  grossly normal  hearing, lips & tongue, mmm Neck:  no LAD, masses or thyromegaly Cardiovascular:  RRR, no m/r/g. No LE edema.  Respiratory:   CTA bilaterally with no wheezes/rales/rhonchi.  Normal respiratory effort. Abdomen:  soft, NT, ND Skin:  no rash or induration seen on limited exam Musculoskeletal:  grossly normal tone BUE/BLE, good ROM, no bony abnormality Psychiatric:  blunted/somnolent mood and affect, speech fluent and appropriate, AOx3 Neurologic:  CN 2-12 grossly intact, moves all extremities in coordinated fashion   Radiological Exams on Admission: Independently reviewed - see discussion in A/P where applicable  DG Chest 2 View  Result Date: 11/19/2022 CLINICAL DATA:  52 year old female with history of chest pain. EXAM: CHEST - 2 VIEW COMPARISON:  Chest x-ray 11/07/2021. FINDINGS: Lung volumes are slightly low, with elevation of the right hemidiaphragm. No confluent consolidative airspace disease. No pleural effusions. No pneumothorax. No evidence of pulmonary edema. Heart size is mildly enlarged with prominence of the left ventricular contour. Upper mediastinal contours are within normal limits. Atherosclerotic calcifications in the thoracic aorta. IMPRESSION: 1. No radiographic evidence of acute cardiopulmonary disease. 2. Aortic atherosclerosis. 3. Elevation of the right hemidiaphragm. 4. Heart size appears mildly enlarged with prominence of the left ventricular contour, which could suggest left ventricular hypertrophy. Electronically Signed   By: Trudie Reed M.D.   On: 11/19/2022 05:32    EKG: Independently reviewed.  NSR with rate 88; peaked T waves; nonspecific ST changes with no evidence of acute ischemia   Labs on Admission: I have personally reviewed the available labs and imaging studies at the time of the admission.  Pertinent labs:    BUN 89/Creatinine 15.18/GFR 3 HS troponin 35, 32 WBC 6.9 Hgb 7.0 HCG <5 UA: 50 glucose, >300 protein   Assessment and Plan:    Volume  overload in an ESRD on HD patient -Patient developed n/v while out of town, was unable to travel home and so missed HD -She was hypoxic to 80% in triage, started on [place] O2 -Since she is dialysis-dependent, she was unable to clear the excess fluid -She is likely to benefit from serial HD both today and tomorrow and may be appropriate for d/c to home tomorrow after HD -Needs routine HD in the future -Will observe on telemetry -Continue prn albuterol  N/V -Possibly related to VGE but has not had diarrhea - in fact, she reports constipation -May all be related to volume overload -For now, will monitor post-HD to see if this improves prior to doing additional evaluation  ESRD on HD -Patient on chronic home HD 4x weekly -Nephrology prn order set utilized -Currently in HD -She is likely to need serial HD  -Continue calcitriol, Renvela, Rena-Vit  Anemia associated with chronic renal disease -Hgb is 7, down from 8.8 in 04/2022 -Type and screen is pending -She may need transfusion prior to dc, goal is >7  HTN -Severe HTN -Continue home meds - Clonidine, Toprol XL, Nifedipine, spironolactone, telmisartan (irbesartan per formulary), torsemide  DM -s/p partial pancreatectomy -Last A1c was 5 in 02/2022 -No longer on medications -Will cover with very sensitive-scale SSI -Continue pancreatic enzymes  HLD -Continue atorvastatin     Advance Care Planning:   Code Status: Full Code   Consults: Nephrology  DVT Prophylaxis: Heparin  Family Communication: None present; she is capable of communicating with family at this time  Severity of Illness: The appropriate patient status for this patient is OBSERVATION. Observation status is judged to be reasonable and necessary in order to provide the required intensity of service to ensure the patient's safety. The patient's presenting symptoms, physical exam findings, and initial radiographic and laboratory data in the context of their medical  condition is felt to place them at decreased risk for further clinical deterioration. Furthermore, it is anticipated that the patient will be medically stable for discharge from the hospital within 2 midnights of admission.   Author: Jonah Blue, MD 11/19/2022 10:07 AM  For on call review [email or web address].

## 2022-11-19 NOTE — Consult Note (Addendum)
Renal Service Consult Note [facility]  Shannon Caldwell 11/19/2022 Shannon Krabbe, MD Requesting Physician: Dr. Ophelia Charter, [name],   Reason for Consult: ESRD pt w/ missed HD, SOB and hypoxemia HPI: The patient is a 52 y.o. year-old w/ PMH as below who presented to ED early am today c/o SOB, sats were 80% on RA in triage and improved to 98% on O2 2L [name]. N/V as well since 5/31. Does home HD 4 days per week, hasn't had HD since Monday as she was out of town visiting her son in [place].    Pt seen in HD. Pt says her main reason she travelled to [place] was to get her "eyes worked on" by her ophthalmologist. She lived in [place] for many years prior to moving to [facility].   Has hx of DM2, HTN, HL, anemia and ESRD since 2023.  She did PD for a while and then did in-center HD and is now doing home HD.    ROS - denies CP, no joint pain, no HA, no blurry vision, no rash, no diarrhea, no nausea/ vomiting, no dysuria, no difficulty voiding   Past Medical History  Past Medical History:  Diagnosis Date   Abnormal vaginal bleeding    Anemia associated with chronic renal failure    s/p transfusions   Diabetes mellitus, type 2 (HCC)    changed to type 1   ESRD (end stage renal disease) on dialysis (HCC) 07/07/2020   Generalized headaches    GERD (gastroesophageal reflux disease)    Hyperlipidemia    Hypertension    Past Surgical History  Past Surgical History:  Procedure Laterality Date   A/V FISTULAGRAM Right 09/30/2022   Procedure: Upper Extremity Angiography;  Surgeon: [name], MD;  Location: [facility] INVASIVE CV LAB;  Service: Cardiovascular;  Laterality: Right;   APPENDECTOMY     AV FISTULA PLACEMENT Right 04/18/2022   Procedure: INSERTION OF ARTERIOVENOUS (AV) GORE-TEX GRAFT RIGHT ARM;  Surgeon: Victorino Sparrow, MD;  Location: [facility] OR;  Service: Vascular;  Laterality: Right;   CARDIAC CATHETERIZATION     CHOLECYSTECTOMY     cytocyst removal     HERNIA REPAIR  August 2000, August 1998   umbilical  hernia    pancreatic debridgement     Family History  Family History  Problem Relation Age of Onset   Hypertension Mother        grandfather   Diabetes Mother        grandmother   Stroke Other        grandparent   Lung cancer Maternal Grandfather    Social History  reports that she has never smoked. She has never used smokeless tobacco. She reports that she does not drink alcohol and does not use drugs. Allergies  Allergies  Allergen Reactions   Amlodipine Shortness Of Breath and Swelling   Octacosanol Shortness Of Breath   Shellfish-Derived Products Itching and Swelling   Latex Itching   Morphine Hives and Itching   Home medications Prior to Admission medications   Medication Sig Start Date End Date Taking? Authorizing Provider  albuterol (VENTOLIN HFA) 108 (90 Base) MCG/ACT inhaler Inhale 2 puffs into the lungs every 6 (six) hours as needed for wheezing or shortness of breath.   Yes [provider]  atorvastatin (LIPITOR) 20 MG tablet Take 20 mg by mouth daily. 03/08/22  Yes [provider]  calcitRIOL (ROCALTROL) 0.5 MCG capsule Take 0.5 mcg by mouth in the morning and at bedtime. 09/21/22  Yes [provider]  cloNIDine (CATAPRES) 0.2 MG tablet Take 0.2 mg by mouth 2 (two) times daily. 01/31/22 01/31/23 Yes [provider]  diphenhydrAMINE (BENADRYL) 25 mg capsule Take 25 mg by mouth daily as needed for allergies. 12/06/21  Yes [provider]  metoprolol succinate (TOPROL-XL) 50 MG 24 hr tablet Take 50 mg by mouth at bedtime.   Yes [provider]  multivitamin (RENA-VIT) TABS tablet Take 1 tablet by mouth daily.   Yes [provider]  NIFEdipine (ADALAT CC) 90 MG 24 hr tablet Take 90 mg by mouth 2 (two) times daily.   Yes [provider]  ondansetron (ZOFRAN) 4 MG tablet Take 4 mg by mouth every 8 (eight) hours as needed for nausea or vomiting.   Yes [provider]  Pancrelipase, Lip-Prot-Amyl,  (ZENPEP) 5000-24000 units CPEP Take 1 capsule by mouth 3 (three) times daily with meals.   Yes [provider]  pantoprazole (PROTONIX) 40 MG tablet Take 40 mg by mouth daily.   Yes [provider]  sevelamer carbonate (RENVELA) 800 MG tablet Take 800 mg by mouth 3 (three) times daily with meals.   Yes [provider]  spironolactone (ALDACTONE) 50 MG tablet Take 50 mg by mouth daily.   Yes [provider]  telmisartan (MICARDIS) 80 MG tablet Take 80 mg by mouth at bedtime. 07/20/21  Yes [provider]  torsemide (DEMADEX) 100 MG tablet Take 100 mg by mouth at bedtime. 09/09/20  Yes [provider]  Methoxy PEG-Epoetin Beta (MIRCERA) 30 MCG/0.3ML SOSY Inject into the skin. 07/05/21   [provider]  mometasone (NASONEX) 50 MCG/ACT nasal spray Place 2 sprays into the nose daily. 01/25/11 08/26/11  Sheliah Hatch, MD     Vitals:   11/19/22 0857 11/19/22 0930 11/19/22 1000 11/19/22 1030  BP: (!) 192/84 (!) 198/81 (!) 209/92 (!) 203/81  Pulse: 88 89 93 93  Resp: 17 17 17 17   Temp: 98 F (36.7 C)     TempSrc:      SpO2: 99% 100% 100% 100%  Weight:       Exam Gen alert, no distress No rash, cyanosis or gangrene Sclera anicteric, throat clear  No jvd or bruits Chest clear bilat to bases, no rales/ wheezing RRR no MRG Abd soft ntnd no mass or ascites +bs GU defer MS no joint effusions or deformity Ext no LE or UE edema, no wounds or ulcers Neuro is alert, Ox 3 , nf    RUA AVG+bruit      Home meds include - albuterol, lipitor, clonidine 0.2 bid, toprol xl 50 hx, renavite, nifedipine 90 bid, panc enzymes, protonix, renvela 800 ac tid, aldactone 50 every day, , telmisartan 300 every day, demadex 100 hs, prns/ vits/ supps      Date   Creat   notes   2010- 2017  0.60- 0.80   Nov  2023   8.40  R AVG placed 04/18/22 by [facility]   09/30/22  11.30    11/19/22  15.18   OP HD: [facility] Signe Colt   MTuThF   2h  4d/  week   72kg   R AVG  Heparin 2000 - last HD was 6/10, post wt 72.7kg - last Hb 8.5, last mircera 6/10 200 mcg    CXR 6/15 - CM, read as normal but looks like vasc congestion to undersigned    Assessment/ Plan: SOB/ hypoxia - presented w/ SpO2 in the 80s,  missed HD for 4-5 days. CXR looks a bit wet to me, however not volume overloaded significantly by exam. Max UF ordered w/ HD this am. Get wt's standing post HD.  N/V - suspect pt is uremic. Creat is up at 15. When she started dialysis last year creat was ~ 5. She has not other symptoms. Normal MS.  HTN - taking clonidine, toprol xl, nifedipine, telmisartan and demadex at home. Cont meds here.  ESRD - on home HD 4x/ week. Was out of town in [place] and missed 3 sessions this week. Does 2h per session. Getting HD now here upstairs, max UF, 3.5-4 hrs.  Volume - will get standing weight post HD. May need dry wt lowered. Wean O2 as tolerated post HD.  Anemia esrd - Hb 7.0 MBD ckd - Ca in range. Add on phos.  DM2      Vinson Moselle  MD [facility] 11/19/2022, 10:42 AM  Recent Labs  Lab 11/19/22 0323  HGB 7.0*  ALBUMIN 4.2  CALCIUM 9.3  CREATININE 15.18*  K 4.6   Inpatient medications:  atorvastatin  20 mg Oral Daily   calcitRIOL  0.5 mcg Oral BID   Chlorhexidine Gluconate Cloth  6 each Topical Q0600   cloNIDine  0.2 mg Oral BID   heparin  5,000 Units Subcutaneous Q8H   hydrALAZINE  5 mg Intravenous Once   insulin aspart  0-6 Units Subcutaneous TID WC   irbesartan  300 mg Oral Daily   metoprolol succinate  50 mg Oral QHS   multivitamin  1 tablet Oral Daily   NIFEdipine  90 mg Oral BID   Pancrelipase (Lip-Prot-Amyl)  1 capsule Oral TID WC   pantoprazole  40 mg Oral Daily   sevelamer carbonate  800 mg Oral TID WC   sodium chloride flush  3 mL Intravenous Q12H   sodium chloride flush  3 mL Intravenous Q12H   spironolactone  50 mg Oral Daily   torsemide  100 mg Oral QHS    sodium chloride     sodium chloride, acetaminophen **OR**  acetaminophen, albuterol, camphor-menthol **AND** hydrOXYzine, docusate sodium, heparin, hydrALAZINE, lidocaine (PF), lidocaine-prilocaine, ondansetron **OR** ondansetron (ZOFRAN) IV, pentafluoroprop-tetrafluoroeth, sorbitol, zolpidem

## 2022-11-19 NOTE — Discharge Summary (Signed)
Physician Discharge Summary  Montserrath Reinholt QIO:[id number] DOB: 27-Sep-1970 DOA: 11/19/2022  PCP: Pcp, No  Admit date: 11/19/2022 Discharge date: 11/19/2022  Admitted From: Home Disposition:  Home  Recommendations for Outpatient Follow-up:  Follow up with PCP in 1-2 weeks Please obtain BMP/CBC in one week Please follow up on the following pending results: Follow-up with nephrology for EPO injection in 1 week  Home Health:No Equipment/Devices:No  Discharge Condition:Stable CODE STATUS:Full Diet recommendation: Nephro with fluid restriction 1200 mL  Brief/Interim Summary:  History of ESRD, on home HD 4 times a week, who missed her 2 dialysis as she traveled to [place] from Tuesday to Thursday and patient started to develop nauseous vomiting and cramping-like epigastric pain since yesterday and came to ED.  On physical exam patient was found to fluid overload and blood work showed significant elevation of uremia and azotemia CT abdomen pelvis benign for acute findings.  Nephrology saw the patient and recommended emergency dialysis, after emergency HD, patient's symptoms of nauseous vomiting abdominal pain resolved and cleared by nephrology to go home and resume home HD tomorrow.  Patient agreed with the plan and discharged on stable conditions.  Blood work showed worsening of anemia probably likely from hemodilution from fluid overload, recommend she follow-up with PCP/nephrology for recheck CBC in 1 week and possible EPO injection.  Discharge Diagnoses:  Principal Problem:   Hypervolemia associated with renal insufficiency Active Problems:   HTN (hypertension)   Hyperlipemia   Anemia associated with chronic renal failure   Diabetes mellitus, type 2 (HCC)   Nausea & vomiting      Discharge Instructions     Discharge instructions   Complete by: As directed         Allergies  Allergen Reactions   Amlodipine Shortness Of Breath and Swelling   Octacosanol Shortness Of  Breath   Shellfish-Derived Products Itching and Swelling   Latex Itching   Morphine Hives and Itching    Consultations: Nephrology   Procedures/Studies: DG Chest 2 View  Result Date: 11/19/2022 CLINICAL DATA:  52 year old female with history of chest pain. EXAM: CHEST - 2 VIEW COMPARISON:  Chest x-ray 11/07/2021. FINDINGS: Lung volumes are slightly low, with elevation of the right hemidiaphragm. No confluent consolidative airspace disease. No pleural effusions. No pneumothorax. No evidence of pulmonary edema. Heart size is mildly enlarged with prominence of the left ventricular contour. Upper mediastinal contours are within normal limits. Atherosclerotic calcifications in the thoracic aorta. IMPRESSION: 1. No radiographic evidence of acute cardiopulmonary disease. 2. Aortic atherosclerosis. 3. Elevation of the right hemidiaphragm. 4. Heart size appears mildly enlarged with prominence of the left ventricular contour, which could suggest left ventricular hypertrophy. Electronically Signed   By: Trudie Reed M.D.   On: 11/19/2022 05:32   (Echo, Carotid, EGD, Colonoscopy, ERCP)    Subjective: Patient reported nauseous vomiting epigastric abdominal pain resolved  Discharge Exam: Vitals:   11/19/22 1205 11/19/22 1305  BP: (!) 208/80 (!) 206/77  Pulse: 88 92  Resp: 14 18  Temp:    SpO2: 100% 100%   Vitals:   11/19/22 1100 11/19/22 1130 11/19/22 1205 11/19/22 1305  BP: (!) 203/82 (!) 197/83 (!) 208/80 (!) 206/77  Pulse: 93 91 88 92  Resp: (!) 21 19 14 18   Temp:      TempSrc:      SpO2: 99% 100% 100% 100%  Weight:        General: Pt is alert, awake, not in acute distress Cardiovascular: RRR, S1/S2 +, no rubs, no  gallops Respiratory: CTA bilaterally, no wheezing, no rhonchi Abdominal: Soft, NT, ND, bowel sounds + Extremities: no edema, no cyanosis    The results of significant diagnostics from this hospitalization (including imaging, microbiology, ancillary and laboratory)  are listed below for reference.     Microbiology: No results found for this or any previous visit (from the past 240 hour(s)).   Labs: BNP (last 3 results) No results for input(s): "BNP" in the last 8760 hours. Basic Metabolic Panel: Recent Labs  Lab 11/19/22 0323  NA 138  K 4.6  CL 94*  CO2 21*  GLUCOSE 100*  BUN 89*  CREATININE 15.18*  CALCIUM 9.3   Liver Function Tests: Recent Labs  Lab 11/19/22 0323  AST 21  ALT 26  ALKPHOS 79  BILITOT 1.2  PROT 8.0  ALBUMIN 4.2   Recent Labs  Lab 11/19/22 0323  LIPASE 31   No results for input(s): "AMMONIA" in the last 168 hours. CBC: Recent Labs  Lab 11/19/22 0323  WBC 6.9  HGB 7.0*  HCT 23.1*  MCV 99.6  PLT 398   Cardiac Enzymes: No results for input(s): "CKTOTAL", "CKMB", "CKMBINDEX", "TROPONINI" in the last 168 hours. BNP: Invalid input(s): "POCBNP" CBG: No results for input(s): "GLUCAP" in the last 168 hours. D-Dimer No results for input(s): "DDIMER" in the last 72 hours. Hgb A1c No results for input(s): "HGBA1C" in the last 72 hours. Lipid Profile No results for input(s): "CHOL", "HDL", "LDLCALC", "TRIG", "CHOLHDL", "LDLDIRECT" in the last 72 hours. Thyroid function studies No results for input(s): "TSH", "T4TOTAL", "T3FREE", "THYROIDAB" in the last 72 hours.  Invalid input(s): "FREET3" Anemia work up Recent Labs    11/19/22 0323  FERRITIN 6,630*  TIBC 263  IRON 45  RETICCTPCT 4.1*   Urinalysis    Component Value Date/Time   COLORURINE YELLOW 11/19/2022 0327   APPEARANCEUR CLEAR 11/19/2022 0327   LABSPEC 1.012 11/19/2022 0327   PHURINE 7.0 11/19/2022 0327   GLUCOSEU 50 (A) 11/19/2022 0327   HGBUR NEGATIVE 11/19/2022 0327   BILIRUBINUR NEGATIVE 11/19/2022 0327   BILIRUBINUR negative 02/19/2016 1104   KETONESUR NEGATIVE 11/19/2022 0327   PROTEINUR >=300 (A) 11/19/2022 0327   UROBILINOGEN 0.2 02/19/2016 1104   UROBILINOGEN 1.0 08/27/2011 1244   NITRITE NEGATIVE 11/19/2022 0327    LEUKOCYTESUR NEGATIVE 11/19/2022 0327   Sepsis Labs Recent Labs  Lab 11/19/22 0323  WBC 6.9   Microbiology No results found for this or any previous visit (from the past 240 hour(s)).   Time coordinating discharge: Over 30 minutes  SIGNED:   Emeline General, MD  [facility] 11/19/2022, 2:11 PM Pager   If 7PM-7AM, please contact night-coverage [email or web address] Password [email or web address]

## 2022-11-19 NOTE — ED Provider Notes (Signed)
[place] [facility]ERGENCY DEPARTMENT AT [facility] Provider Note   CSN: [id number] Arrival date & time: 11/19/22  4098     History  Chief Complaint  Patient presents with   Abdominal Pain   Chest Pain   Shortness of Breath    Shannon Caldwell is a 52 y.o. female.  The history is provided by the patient.  Shortness of Breath Severity:  Severe Onset quality:  Gradual Timing:  Constant Progression:  Unchanged Chronicity:  New Context: not URI   Relieved by:  Nothing Worsened by:  Nothing Ineffective treatments:  None tried Risk factors: no oral contraceptive use   Patient with ESRD on home dialysis who has not had dialysis since Monday due to being out of town presents with nausea and SOB and fatigue.  Patient has not taken tonight's medication due to nausea.       Home Medications Prior to Admission medications   Medication Sig Start Date End Date Taking? Authorizing Provider  albuterol (VENTOLIN HFA) 108 (90 Base) MCG/ACT inhaler Inhale 2 puffs into the lungs every 6 (six) hours as needed for wheezing or shortness of breath.    [provider]  atorvastatin (LIPITOR) 20 MG tablet Take 20 mg by mouth daily. 03/08/22   [provider]  calcitRIOL (ROCALTROL) 0.5 MCG capsule Take 0.5 mcg by mouth daily. 09/21/22   [provider]  cloNIDine (CATAPRES) 0.2 MG tablet Take 0.2 mg by mouth 2 (two) times daily. 01/31/22 01/31/23  [provider]  diphenhydrAMINE (BENADRYL) 25 mg capsule Take 25 mg by mouth daily as needed for allergies. 12/06/21   [provider]  lidocaine-prilocaine (EMLA) cream Apply 1 Application topically daily as needed (Dialysis from home). 08/29/22   [provider]  Methoxy PEG-Epoetin Beta (MIRCERA) 30 MCG/0.3ML SOSY Inject into the skin. 07/05/21   [provider]  metoprolol succinate (TOPROL-XL) 50 MG 24 hr tablet Take 50 mg by mouth at bedtime.    [provider]  multivitamin  (RENA-VIT) TABS tablet Take 1 tablet by mouth daily.    [provider]  NIFEdipine (ADALAT CC) 90 MG 24 hr tablet Take 90 mg by mouth 2 (two) times daily.    [provider]  ondansetron (ZOFRAN) 4 MG tablet Take 4 mg by mouth every 8 (eight) hours as needed for nausea or vomiting.    [provider]  Pancrelipase, Lip-Prot-Amyl, (ZENPEP) 5000-24000 units CPEP Take 1 capsule by mouth 3 (three) times daily with meals.    [provider]  pantoprazole (PROTONIX) 40 MG tablet Take 40 mg by mouth daily.    [provider]  sevelamer carbonate (RENVELA) 800 MG tablet Take 800 mg by mouth 3 (three) times daily with meals.    [provider]  spironolactone (ALDACTONE) 50 MG tablet Take 50 mg by mouth daily.    [provider]  telmisartan (MICARDIS) 80 MG tablet Take 80 mg by mouth at bedtime. 07/20/21   [provider]  torsemide (DEMADEX) 100 MG tablet Take 100 mg by mouth at bedtime. 09/09/20   [provider]  mometasone (NASONEX) 50 MCG/ACT nasal spray Place 2 sprays into the nose daily. 01/25/11 08/26/11  Sheliah Hatch, MD      Allergies    Amlodipine, Octacosanol, Shellfish-derived products, Latex, and Morphine    Review of Systems   Review of Systems  Constitutional:  Positive for fatigue.  Respiratory:  Positive for shortness of breath.   Gastrointestinal:  Positive for  nausea.    Physical Exam Updated Vital Signs BP (!) 202/83   Pulse 86   Temp 98.5 F (36.9 C) (Oral)   Resp 17   Wt 71.7 kg   LMP 03/22/2022 (Approximate)   SpO2 94%   BMI 26.30 kg/m  Physical Exam Vitals and nursing note reviewed.  Constitutional:      General: She is not in acute distress.    Appearance: Normal appearance. She is well-developed.  HENT:     Head: Normocephalic and atraumatic.     Nose: Nose normal.  Eyes:     Pupils: Pupils are equal, round, and reactive to light.  Cardiovascular:     Rate and Rhythm:  Normal rate and regular rhythm.     Pulses: Normal pulses.     Heart sounds: Normal heart sounds.  Pulmonary:     Effort: Pulmonary effort is normal. No respiratory distress.     Breath sounds: Rhonchi and rales present.  Abdominal:     General: Bowel sounds are normal. There is no distension.     Palpations: Abdomen is soft.     Tenderness: There is no abdominal tenderness. There is no guarding or rebound.  Genitourinary:    Vagina: No vaginal discharge.  Musculoskeletal:        General: Normal range of motion.     Cervical back: Normal range of motion and neck supple.  Skin:    General: Skin is warm and dry.     Capillary Refill: Capillary refill takes less than 2 seconds.     Coloration: Skin is pale.     Findings: No erythema or rash.  Neurological:     General: No focal deficit present.     Mental Status: She is alert and oriented to person, place, and time.     Deep Tendon Reflexes: Reflexes normal.  Psychiatric:        Mood and Affect: Mood normal.     ED Results / Procedures / Treatments   Labs (all labs ordered are listed, but only abnormal results are displayed) Results for orders placed or performed during the hospital encounter of 11/19/22  CBC  Result Value Ref Range   WBC 6.9 4.0 - 10.5 K/uL   RBC 2.32 (L) 3.87 - 5.11 MIL/uL   Hemoglobin 7.0 (L) 12.0 - 15.0 g/dL   HCT 01.0 (L) 27.2 - 53.6 %   MCV 99.6 80.0 - 100.0 fL   MCH 30.2 26.0 - 34.0 pg   MCHC 30.3 30.0 - 36.0 g/dL   RDW 64.4 (H) 03.4 - 74.2 %   Platelets 398 150 - 400 K/uL   nRBC 1.3 (H) 0.0 - 0.2 %  Comprehensive metabolic panel  Result Value Ref Range   Sodium 138 135 - 145 mmol/L   Potassium 4.6 3.5 - 5.1 mmol/L   Chloride 94 (L) 98 - 111 mmol/L   CO2 21 (L) 22 - 32 mmol/L   Glucose, Bld 100 (H) 70 - 99 mg/dL   BUN 89 (H) 6 - 20 mg/dL   Creatinine, Ser 59.56 (H) 0.44 - 1.00 mg/dL   Calcium 9.3 8.9 - 38.7 mg/dL   Total Protein 8.0 6.5 - 8.1 g/dL   Albumin 4.2 3.5 - 5.0 g/dL   AST 21 15 -  41 U/L   ALT 26 0 - 44 U/L   Alkaline Phosphatase 79 38 - 126 U/L   Total Bilirubin 1.2 0.3 - 1.2 mg/dL   GFR, Estimated 3 (L) >60 mL/min  Anion gap 23 (H) 5 - 15  Lipase, blood  Result Value Ref Range   Lipase 31 11 - 51 U/L  Urinalysis, Routine w reflex microscopic -Urine, Clean Catch  Result Value Ref Range   Color, Urine YELLOW YELLOW   APPearance CLEAR CLEAR   Specific Gravity, Urine 1.012 1.005 - 1.030   pH 7.0 5.0 - 8.0   Glucose, UA 50 (A) NEGATIVE mg/dL   Hgb urine dipstick NEGATIVE NEGATIVE   Bilirubin Urine NEGATIVE NEGATIVE   Ketones, ur NEGATIVE NEGATIVE mg/dL   Protein, ur >=409 (A) NEGATIVE mg/dL   Nitrite NEGATIVE NEGATIVE   Leukocytes,Ua NEGATIVE NEGATIVE   RBC / HPF 0-5 0 - 5 RBC/hpf   WBC, UA 0-5 0 - 5 WBC/hpf   Bacteria, UA NONE SEEN NONE SEEN   Squamous Epithelial / HPF 6-10 0 - 5 /HPF   Mucus PRESENT   I-Stat beta hCG blood, ED ([facility], [facility], [facility] only)  Result Value Ref Range   I-stat hCG, quantitative <5.0 <5 mIU/mL   Comment 3          I-Stat beta hCG blood, ED ([facility], [facility], [facility] only)  Result Value Ref Range   I-stat hCG, quantitative <5.0 <5 mIU/mL   Comment 3          Troponin I (High Sensitivity)  Result Value Ref Range   Troponin I (High Sensitivity) 35 (H) <18 ng/L  Troponin I (High Sensitivity)  Result Value Ref Range   Troponin I (High Sensitivity) 32 (H) <18 ng/L   DG Chest 2 View  Result Date: 11/19/2022 CLINICAL DATA:  52 year old female with history of chest pain. EXAM: CHEST - 2 VIEW COMPARISON:  Chest x-ray 11/07/2021. FINDINGS: Lung volumes are slightly low, with elevation of the right hemidiaphragm. No confluent consolidative airspace disease. No pleural effusions. No pneumothorax. No evidence of pulmonary edema. Heart size is mildly enlarged with prominence of the left ventricular contour. Upper mediastinal contours are within normal limits. Atherosclerotic calcifications in the thoracic aorta. IMPRESSION: 1. No radiographic evidence of acute  cardiopulmonary disease. 2. Aortic atherosclerosis. 3. Elevation of the right hemidiaphragm. 4. Heart size appears mildly enlarged with prominence of the left ventricular contour, which could suggest left ventricular hypertrophy. Electronically Signed   By: Trudie Reed M.D.   On: 11/19/2022 05:32     EKG EKG Interpretation  Date/Time:  Saturday November 19 2022 03:02:16 EDT Ventricular Rate:  88 PR Interval:  160 QRS Duration: 98 QT Interval:  374 QTC Calculation: 452 R Axis:   146 Text Interpretation: Normal sinus rhythm Right axis deviation Nonspecific ST abnormality peaked T waves of hyperkalemia Confirmed by Nicanor Alcon, Jazzma Neidhardt ([id number]) on 11/19/2022 3:50:19 AM  Radiology No results found.  Procedures Procedures    Medications Ordered in ED Medications  hydrALAZINE (APRESOLINE) injection 5 mg (has no administration in time range)  nitroGLYCERIN (NITROGLYN) 2 % ointment 1 inch (has no administration in time range)  haloperidol lactate (HALDOL) injection 2 mg (2 mg Intravenous Given 11/19/22 0503)    ED Course/ Medical Decision Making/ A&P                             Medical Decision Making Patient with ESRD on home dialysis who has not had dialysis since Monday due to being out of town   Amount and/or Complexity of Data Reviewed External Data Reviewed: notes.    Details: Previous notes reviewed  Labs: ordered.  Details: Negative pregnancy test, troponins elevated 32 and 35, normal white count 6.5, low hemoglobin 7, normal platelets.  Normal sodium 138, normal potassium 4.6, elevated creatinine 15.18 Radiology: ordered and independent interpretation performed.    Details: Pulmonary edema by me  ECG/medicine tests: ordered and independent interpretation performed. Decision-making details documented in ED Course. Discussion of management or test interpretation with external provider(s): Case d/w Dr. Allena Katz of renal who will see patient for dialysis in am Case d/w Dr. Haroldine Laws  who will admit   Risk Prescription drug management. Decision regarding hospitalization.    Final Clinical Impression(s) / ED Diagnoses Final diagnoses:  Hypertensive urgency  Elevated troponin  ESRD (end stage renal disease) (HCC)  Symptomatic anemia  Nausea   The patient appears reasonably stabilized for admission considering the current resources, flow, and capabilities available in the ED at this time, and I doubt any other [facility] requiring further screening and/or treatment in the ED prior to admission.  Rx / DC Orders ED Discharge Orders     None         Devaris Quirk, MD 11/19/22 1610

## 2022-11-19 NOTE — ED Triage Notes (Signed)
Pt in with sob, sats 80% in triage - 2LNC applied, sats improved to 98%. Also c/o nausea and emesis since 5/31, worsened sob since Monday and having chest pain when lying flat. Pt does self-hemodialysis 4x/week, but last trx was Monday since she was out of town. Pt also states her Hg was 7.9 recently

## 2022-11-19 NOTE — ED Notes (Signed)
Pt's daughter is leaving, contact is [phone] Willene Hatchet

## 2022-11-20 LAB — TYPE AND SCREEN
ABO/RH(D): A POS
Antibody Screen: POSITIVE
DAT, IgG: NEGATIVE
Donor AG Type: NEGATIVE
Donor AG Type: NEGATIVE

## 2022-11-20 LAB — BPAM RBC
Blood Product Expiration Date: 202406262359
Blood Product Expiration Date: 202407052359
Unit Type and Rh: 6200
Unit Type and Rh: 6200

## 2022-11-22 LAB — HEPATITIS B SURFACE ANTIBODY, QUANTITATIVE: Hep B S AB Quant (Post): 5167 m[IU]/mL (ref >9.9)

## 2022-12-23 ENCOUNTER — Institutional Professional Consult (permissible substitution) (HOSPITAL_BASED_OUTPATIENT_CLINIC_OR_DEPARTMENT_OTHER): Payer: Medicare Other | Admitting: Pulmonary Disease

## 2023-02-09 ENCOUNTER — Encounter (HOSPITAL_BASED_OUTPATIENT_CLINIC_OR_DEPARTMENT_OTHER): Payer: Self-pay | Admitting: Pulmonary Disease

## 2023-02-09 ENCOUNTER — Ambulatory Visit (INDEPENDENT_AMBULATORY_CARE_PROVIDER_SITE_OTHER): Payer: Medicare Other | Admitting: Pulmonary Disease

## 2023-02-09 VITALS — BP 158/78 | HR 79 | Ht 65.0 in | Wt 159.0 lb

## 2023-02-09 DIAGNOSIS — R042 Hemoptysis: Secondary | ICD-10-CM

## 2023-02-09 NOTE — Patient Instructions (Addendum)
Hemoptysis --CT Angiography ordered to rule out PE and visualize lung parenchyma --Plan for bronchoscopy orders: Flex bronch between 9/9-9/12. We will contact you to schedule --Ambulatory O2 performed today

## 2023-02-09 NOTE — Progress Notes (Signed)
Subjective:   PATIENT ID: Shannon Caldwell GENDER: female DOB: [birth year], MRN: [id number]  Chief Complaint  Patient presents with   Establish Care    Reason for Visit: New consult for shortness of breath, intermittent hemoptysis  Shannon Caldwell is a 52 year old female never smoker with HTN, chronic diastolic heart failure, covid long hauler since 07/2018, ESRD on HD, DM2, hx pancreatic injury/pseudocyst, previously oxygen dependent, pulmonary hypertension who presents for evaluation for shortness of breath and intermittent hemoptysis.  She reports intermittent hemoptysis with appears as clotted blood with thick mucous every hours. Worse in the morning and evening. Her hemoglobin is dropping slowly with Hg 6.3 to 7.0 after transfusion on 12/22/22. Denies dizziness, headaches, near syncope. Shortness of breath occurs with exertion. Not active at baseline. Not on bronchodilators. Denies wheezing. Denies hx of chronic respiratory infection or childhood asthma.  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  Past Medical History:  Diagnosis Date   Abnormal vaginal bleeding    Anemia associated with chronic renal failure    s/p transfusions   Diabetes mellitus, type 2 (HCC)    changed to type 1   ESRD (end stage renal disease) on dialysis (HCC) 07/07/2020   Generalized headaches    GERD (gastroesophageal reflux disease)    Hyperlipidemia    Hypertension      Family History  Problem Relation Age of Onset   Hypertension Mother        grandfather   Diabetes Mother        grandmother   Stroke Other        grandparent   Lung cancer Maternal Grandfather      Social History   Occupational History   Occupation: Marketing executive:  [employer]   Occupation: med Best boy    Comment: [employer] [place] on weekends   Occupation: [employer]    Comment: during week  Tobacco Use   Smoking status: Never   Smokeless tobacco: Never  Vaping Use   Vaping status: Never Used  Substance and Sexual Activity   Alcohol use: No   Drug use: No   Sexual activity: Not on file    Allergies  Allergen Reactions   Amlodipine Shortness Of Breath and Swelling   Octacosanol Shortness Of Breath   Shellfish-Derived Products Itching and Swelling   Latex Itching   Morphine Hives and Itching     Outpatient Medications Prior to Visit  Medication Sig Dispense Refill   albuterol (VENTOLIN HFA) 108 (90 Base) MCG/ACT inhaler Inhale 2 puffs into the lungs every 6 (six) hours as needed for wheezing or shortness of breath.     atorvastatin (LIPITOR) 20 MG tablet Take 20 mg by mouth daily.     calcitRIOL (ROCALTROL) 0.5 MCG capsule Take 0.5 mcg by mouth in the morning and at bedtime.     diphenhydrAMINE (BENADRYL) 25 mg capsule Take 25 mg by mouth daily as needed for allergies.     lidocaine-prilocaine (EMLA) cream Apply 1 Application topically.     metoprolol succinate (TOPROL-XL) 50 MG 24 hr tablet Take 50 mg by mouth at bedtime.     multivitamin (RENA-VIT) TABS tablet Take 1 tablet by mouth daily.     NIFEdipine (PROCARDIA XL/NIFEDICAL-XL) 90 MG 24 hr tablet Take 90 mg by  mouth daily.     ondansetron (ZOFRAN) 4 MG tablet Take 4 mg by mouth every 8 (eight) hours as needed for nausea or vomiting.     Pancrelipase, Lip-Prot-Amyl, (ZENPEP) 5000-24000 units CPEP Take 1 capsule by mouth 3 (three) times daily with meals.     pantoprazole (PROTONIX) 40 MG tablet Take 40 mg by mouth daily.     sevelamer carbonate (RENVELA) 800 MG tablet Take 800 mg by mouth 3 (three) times daily with meals.     spironolactone (ALDACTONE) 50 MG tablet Take 50 mg by mouth daily.     telmisartan (MICARDIS) 80 MG tablet Take 80 mg by mouth at bedtime.     torsemide  (DEMADEX) 100 MG tablet Take 100 mg by mouth at bedtime.     melatonin 3 MG TABS tablet Take 3 mg by mouth at bedtime. (Patient not taking: Reported on 02/09/2023)     Methoxy PEG-Epoetin Beta (MIRCERA) 30 MCG/0.3ML SOSY Inject into the skin.     NIFEdipine (ADALAT CC) 90 MG 24 hr tablet Take 90 mg by mouth 2 (two) times daily.     Facility-Administered Medications Prior to Visit  Medication Dose Route Frequency Provider Last Rate Last Admin   0.9 %  sodium chloride infusion  250 mL Intravenous PRN [name], MD       sodium chloride flush (NS) 0.9 % injection 3 mL  3 mL Intravenous Q12H [name], MD        Review of Systems  Constitutional:  Negative for chills, diaphoresis, fever, malaise/fatigue and weight loss.  HENT:  Negative for congestion.   Respiratory:  Positive for hemoptysis and shortness of breath. Negative for cough, sputum production and wheezing.   Cardiovascular:  Negative for chest pain, palpitations and leg swelling.     Objective:   Vitals:   02/09/23 1406  BP: (!) 158/78  Pulse: 79  SpO2: 92%  Weight: 159 lb (72.1 kg)  Height: 5\' 5"  (1.651 m)   SpO2: 92 %  Physical Exam: General: Well-appearing, no acute distress HENT: [place], AT Eyes: EOMI, no scleral icterus Respiratory: Clear to auscultation bilaterally.  No crackles, wheezing or rales Cardiovascular: RRR, -M/R/G, no JVD Extremities:-Edema,-tenderness Neuro: AAO x4, CNII-XII grossly intact Psych: Normal mood, normal affect  Data Reviewed:  Imaging: CXR 11/19/22 Right hemidiaphragm elevation. No infiltrate effusion or edema  PFT: 04/02/23 at [facility] Mild complex restriction. Moderate reduction in uncorrected diffusing capacity.     Labs:    Latest Ref Rng & Units 11/19/2022    3:23 AM 09/30/2022    6:15 AM 04/18/2022    2:52 PM  CBC  WBC 4.0 - 10.5 K/uL 6.9   9.2   Hemoglobin 12.0 - 15.0 g/dL 7.0  8.5  8.8   Hematocrit 36.0 - 46.0 % 23.1  25.0  28.3   Platelets 150 - 400 K/uL  398   243    12/22/22 Hg 7.0    Assessment & Plan:   Discussion: 52 year old female never smoker with HTN, chronic diastolic heart failure, covid long hauler since 07/2018, ESRD on HD, DM2, hx pancreatic injury/pseudocyst, previously oxygen dependent, pulmonary hypertension who presents for evaluation for shortness of breath and intermittent hemoptysis. Med list reviewed with no blood thinners. No recent infection. Will obtain CTA for further evaluation for possible source of hemoptysis but will likely need bronchoscopy if imaging unrevealing. Counseled on risks and benefits including bleed, infection and lung collapse of flexible bronchoscopy. Addressed questions and concerns  from patient and daughters.  Hemoptysis --CT Angiography ordered to rule out PE and visualize lung parenchyma --Plan for bronchoscopy orders: Flex bronch between 9/9-9/12. We will contact you to schedule --Ambulatory O2 performed today with no desaturations --CBC ordered  Shortness of breath - consider deconditioning, prior PFTs with mild restrictive defect with moderate reduction in DLCO --Consider repeat PFTs in the future after hemoptysis work-up  Health Maintenance Immunization History  Administered Date(s) Administered   Hepb-cpg 12/23/2020   Influenza Split 09/21/2012   Influenza, Quadrivalent, Recombinant, Inj, Pf 02/08/2018, 03/31/2022   Influenza,inj,Quad PF,6+ Mos 04/18/2014, 02/19/2016, 03/06/2018, 03/20/2019, 04/07/2019, 03/08/2021   PFIZER Comirnaty(Gray Top)Covid-19 Tri-Sucrose Vaccine 04/29/2020   PFIZER(Purple Top)SARS-COV-2 Vaccination 09/16/2019, 10/07/2019   Pfizer Covid-19 Vaccine Bivalent Booster 78yrs & up 03/18/2021   Pneumococcal Conjugate-13 11/25/2020   Pneumococcal Polysaccharide-23 03/14/2010, 03/06/2018   Tdap 04/07/2016   CT Lung Screen - not qualified due to age and never smoker  Orders Placed This Encounter  Procedures   CT Angio Chest Pulmonary Embolism (PE) W or WO Contrast     Standing Status:   Future    Number of Occurrences:   1    Standing Expiration Date:   02/09/2024    Scheduling Instructions:     Not emergent but as soon as possible prior to bronchoscopy next week    Order Specific Question:   If indicated for the ordered procedure, I authorize the administration of contrast media per Radiology protocol    Answer:   Yes    Order Specific Question:   Does the patient have a contrast media/X-ray dye allergy?    Answer:   No    Order Specific Question:   Is patient pregnant?    Answer:   No    Order Specific Question:   Preferred imaging location?    Answer:   [facility]   CBC With Differential  No orders of the defined types were placed in this encounter.   No follow-ups on file.  I have spent a total time of 45-minutes on the day of the appointment reviewing prior documentation, coordinating care and discussing medical diagnosis and plan with the patient/family. Imaging, labs and tests included in this note have been reviewed and interpreted independently by me.  Elta Angell Mechele Collin, MD [facility] 02/09/2023 2:45 PM  Office Number [phone]

## 2023-02-12 ENCOUNTER — Ambulatory Visit (HOSPITAL_BASED_OUTPATIENT_CLINIC_OR_DEPARTMENT_OTHER)
Admission: RE | Admit: 2023-02-12 | Discharge: 2023-02-12 | Disposition: A | Discharge status: Home / Self Care | Payer: 59 | Source: Ambulatory Visit | Attending: Pulmonary Disease | Admitting: Pulmonary Disease

## 2023-02-12 DIAGNOSIS — R042 Hemoptysis: Secondary | ICD-10-CM | POA: Insufficient documentation

## 2023-02-12 LAB — POCT I-STAT CREATININE: Creatinine, Ser: 14.9 mg/dL — ABNORMAL HIGH (ref 0.44–1.00)

## 2023-02-12 MED ORDER — IOHEXOL 350 MG/ML SOLN
100.0000 mL | Freq: Once | INTRAVENOUS | Status: AC | PRN
  Administered 2023-02-12: 100 mL via INTRAVENOUS

## 2023-02-13 ENCOUNTER — Encounter (HOSPITAL_BASED_OUTPATIENT_CLINIC_OR_DEPARTMENT_OTHER): Payer: Self-pay | Admitting: Pulmonary Disease

## 2023-02-19 ENCOUNTER — Encounter (HOSPITAL_BASED_OUTPATIENT_CLINIC_OR_DEPARTMENT_OTHER): Payer: Self-pay

## 2023-02-19 ENCOUNTER — Ambulatory Visit (HOSPITAL_BASED_OUTPATIENT_CLINIC_OR_DEPARTMENT_OTHER)
Admission: RE | Admit: 2023-02-19 | Discharge: 2023-02-19 | Disposition: A | Discharge status: Home / Self Care | Payer: Medicare Other | Source: Ambulatory Visit | Attending: Pulmonary Disease | Admitting: Pulmonary Disease

## 2023-02-19 DIAGNOSIS — R042 Hemoptysis: Secondary | ICD-10-CM | POA: Insufficient documentation

## 2023-02-19 MED ORDER — IOHEXOL 350 MG/ML SOLN
100.0000 mL | Freq: Once | INTRAVENOUS | Status: AC | PRN
  Administered 2023-02-19: 100 mL via INTRAVENOUS

## 2023-02-20 ENCOUNTER — Ambulatory Visit (HOSPITAL_BASED_OUTPATIENT_CLINIC_OR_DEPARTMENT_OTHER): Payer: Medicare Other

## 2023-02-22 ENCOUNTER — Ambulatory Visit (HOSPITAL_BASED_OUTPATIENT_CLINIC_OR_DEPARTMENT_OTHER)
Admission: RE | Admit: 2023-02-22 | Discharge: 2023-02-22 | Disposition: A | Discharge status: Home / Self Care | Payer: Medicare Other | Source: Ambulatory Visit | Attending: Pulmonary Disease | Admitting: Pulmonary Disease

## 2023-02-22 DIAGNOSIS — R042 Hemoptysis: Secondary | ICD-10-CM | POA: Insufficient documentation

## 2023-02-22 MED ORDER — IOHEXOL 350 MG/ML SOLN
100.0000 mL | Freq: Once | INTRAVENOUS | Status: AC | PRN
  Administered 2023-02-22: 80 mL via INTRAVENOUS

## 2023-03-02 ENCOUNTER — Telehealth (HOSPITAL_BASED_OUTPATIENT_CLINIC_OR_DEPARTMENT_OTHER): Payer: Self-pay | Admitting: Pulmonary Disease

## 2023-03-02 ENCOUNTER — Other Ambulatory Visit (HOSPITAL_COMMUNITY): Payer: Self-pay

## 2023-03-02 MED ORDER — BUDESONIDE-FORMOTEROL FUMARATE 160-4.5 MCG/ACT IN AERO: 2.0000 | INHALATION_SPRAY | Freq: Two times a day (BID) | RESPIRATORY_TRACT | 5 refills | Status: AC

## 2023-03-02 NOTE — Telephone Encounter (Signed)
  Pharmacy check for inhalers based on insurance: AARP = Breo $4.60, Dulera $4.60  [employer] = Advair HFA $0.00, Wixela $0.00, Breo $0.00, Symbicort $0.00 (brand and generic)   Will start on Symbicort. Sent to patient's preferred pharmacy

## 2023-03-09 ENCOUNTER — Other Ambulatory Visit (HOSPITAL_BASED_OUTPATIENT_CLINIC_OR_DEPARTMENT_OTHER): Payer: Self-pay

## 2023-03-09 DIAGNOSIS — R042 Hemoptysis: Secondary | ICD-10-CM

## 2023-03-09 DIAGNOSIS — R0609 Other forms of dyspnea: Secondary | ICD-10-CM

## 2023-03-13 ENCOUNTER — Encounter (HOSPITAL_BASED_OUTPATIENT_CLINIC_OR_DEPARTMENT_OTHER): Payer: Medicare Other

## 2023-03-13 ENCOUNTER — Encounter (HOSPITAL_BASED_OUTPATIENT_CLINIC_OR_DEPARTMENT_OTHER): Payer: Self-pay | Admitting: Pulmonary Disease

## 2023-03-15 ENCOUNTER — Ambulatory Visit (HOSPITAL_BASED_OUTPATIENT_CLINIC_OR_DEPARTMENT_OTHER): Payer: Medicare Other | Admitting: Pulmonary Disease

## 2023-11-20 ENCOUNTER — Other Ambulatory Visit: Payer: Self-pay

## 2023-11-20 ENCOUNTER — Encounter (HOSPITAL_COMMUNITY)
Admission: RE | Disposition: A | Discharge status: Home / Self Care | Payer: Self-pay | Source: Home / Self Care | Attending: Vascular Surgery

## 2023-11-20 ENCOUNTER — Ambulatory Visit (HOSPITAL_COMMUNITY)
Admission: RE | Admit: 2023-11-20 | Discharge: 2023-11-20 | Disposition: A | Discharge status: Home / Self Care | Payer: No Typology Code available for payment source | Attending: Vascular Surgery | Admitting: Vascular Surgery

## 2023-11-20 DIAGNOSIS — Y832 Surgical operation with anastomosis, bypass or graft as the cause of abnormal reaction of the patient, or of later complication, without mention of misadventure at the time of the procedure: Secondary | ICD-10-CM | POA: Insufficient documentation

## 2023-11-20 DIAGNOSIS — I120 Hypertensive chronic kidney disease with stage 5 chronic kidney disease or end stage renal disease: Secondary | ICD-10-CM | POA: Insufficient documentation

## 2023-11-20 DIAGNOSIS — T82838A Hemorrhage of vascular prosthetic devices, implants and grafts, initial encounter: Secondary | ICD-10-CM

## 2023-11-20 DIAGNOSIS — T82858A Stenosis of vascular prosthetic devices, implants and grafts, initial encounter: Secondary | ICD-10-CM | POA: Diagnosis present

## 2023-11-20 DIAGNOSIS — E1022 Type 1 diabetes mellitus with diabetic chronic kidney disease: Secondary | ICD-10-CM | POA: Insufficient documentation

## 2023-11-20 DIAGNOSIS — Z992 Dependence on renal dialysis: Secondary | ICD-10-CM | POA: Diagnosis not present

## 2023-11-20 DIAGNOSIS — N186 End stage renal disease: Secondary | ICD-10-CM | POA: Diagnosis not present

## 2023-11-20 HISTORY — PX: A/V SHUNT INTERVENTION: CATH118220

## 2023-11-20 HISTORY — PX: VENOUS ANGIOPLASTY: CATH118376

## 2023-11-20 SURGERY — A/V SHUNT INTERVENTION
Anesthesia: LOCAL | Site: Arm Upper | Laterality: Right

## 2023-11-20 MED ORDER — LIDOCAINE HCL (PF) 1 % IJ SOLN
INTRAMUSCULAR | Status: AC
  Filled 2023-11-20: qty 30

## 2023-11-20 MED ORDER — HEPARIN (PORCINE) IN NACL 1000-0.9 UT/500ML-% IV SOLN
INTRAVENOUS | Status: DC | PRN
  Administered 2023-11-20: 500 mL

## 2023-11-20 MED ORDER — IODIXANOL 320 MG/ML IV SOLN
INTRAVENOUS | Status: DC | PRN
Start: 2023-11-20 — End: 2023-11-20
  Administered 2023-11-20: 20 mL via INTRAVENOUS

## 2023-11-20 MED ORDER — LIDOCAINE HCL (PF) 1 % IJ SOLN
INTRAMUSCULAR | Status: DC | PRN
Start: 2023-11-20 — End: 2023-11-20
  Administered 2023-11-20: 5 mL

## 2023-11-20 SURGICAL SUPPLY — 8 items
BALLOON MUSTANG 8.0X40 75 (BALLOONS) IMPLANT
KIT MICROPUNCTURE NIT STIFF (SHEATH) IMPLANT
KIT PV (KITS) ×2 IMPLANT
SHEATH PINNACLE R/O II 6F 4CM (SHEATH) IMPLANT
SHEATH PROBE COVER 6X72 (BAG) IMPLANT
TRAY PV CATH (CUSTOM PROCEDURE TRAY) ×2 IMPLANT
TUBING CIL FLEX 10 FLL-RA (TUBING) IMPLANT
WIRE BENTSON .035X145CM (WIRE) IMPLANT

## 2023-11-20 NOTE — Op Note (Signed)
    Patient name: [name] MRN: [id number] DOB: 1970-10-08 Sex: female  11/20/2023 Pre-operative Diagnosis: ESRD on HD, prolonged bleeding after HD sessions Post-operative diagnosis:  Same Surgeon:  Philipp Brawn, MD Procedure Performed:  Ultrasound-guided access of right arm AVG Fistulogram and central venogram Balloon angioplasty of upper arm brachial vein, 8 x 40 Mustang  Indications: Ms. Shannon Caldwell is a 53 year old female who presents to the HD access center today due to prolonged bleeding after HD sessions.  She last dialyzed on Saturday.  Risks and benefits of fistulogram with possible intervention were reviewed, she expressed understanding and elected to proceed.  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  Widely patent AVG and arterial anastomosis   Procedure:  The patient was identified in the holding area and taken to the cath lab  The patient was then placed supine on the table and prepped and draped in the usual sterile fashion.  A time out was called.  Ultrasound was used to evaluate the right arm AV access. This was accessed under u/s guidance. An 018 wire was advanced without resistance, a micropuncture sheath was placed and fistulagram obtained which demonstrated the above findings.  This access was then upsized to a 6 F short sheath over a glidewire.  The lesion was then crossed with a Bentson wire and treated with an 8 mm x 40 mm Mustang balloon.  Completion angiogram demonstrated an adequate result with less than 30% residual stenosis.  Contrast: 20 cc  Impression: Adequate result of balloon angioplasty of the venous outflow, 8 x 40 Mustang balloon.  Will likely need stent if this area continues to have issues.   Philipp Brawn MD [facility] Office: [phone]

## 2023-11-20 NOTE — H&P (Signed)
  HD ACCESS CENTER H&P   Patient ID: Shannon Caldwell, female   DOB: [birth year], 53 y.o.   MRN: [id number]  Subjective:     HPI Shannon Caldwell is a 53 y.o. female with ESRD presenting to the HD access center for intervention.  Past Medical History:  Diagnosis Date   Abnormal vaginal bleeding    Anemia associated with chronic renal failure    s/p transfusions   Diabetes mellitus, type 2 (HCC)    changed to type 1   ESRD (end stage renal disease) on dialysis (HCC) 07/07/2020   Generalized headaches    GERD (gastroesophageal reflux disease)    Hyperlipidemia    Hypertension    Family History  Problem Relation Age of Onset   Hypertension Mother        grandfather   Diabetes Mother        grandmother   Stroke Other        grandparent   Lung cancer Maternal Grandfather    Past Surgical History:  Procedure Laterality Date   A/V FISTULAGRAM Right 09/30/2022   Procedure: Upper Extremity Angiography;  Surgeon: Carlene Che, MD;  Location: [facility] INVASIVE CV LAB;  Service: Cardiovascular;  Laterality: Right;   APPENDECTOMY     AV FISTULA PLACEMENT Right 04/18/2022   Procedure: INSERTION OF ARTERIOVENOUS (AV) GORE-TEX GRAFT RIGHT ARM;  Surgeon: Kayla Part, MD;  Location: [facility] OR;  Service: Vascular;  Laterality: Right;   CARDIAC CATHETERIZATION     CHOLECYSTECTOMY     cytocyst removal     HERNIA REPAIR  August 2000, August 1998   umbilical hernia    pancreatic debridgement      Short Social History:  Social History   Tobacco Use   Smoking status: Never   Smokeless tobacco: Never  Substance Use Topics   Alcohol use: No    Allergies  Allergen Reactions   Amlodipine Shortness Of Breath and Swelling   Octacosanol Shortness Of Breath   Shellfish-Derived Products Itching and Swelling   Latex Itching   Morphine Hives and Itching    No current facility-administered medications for this encounter.    REVIEW OF SYSTEMS All other systems were reviewed and are  negative     Objective:   Objective   Vitals:   11/20/23 1136  BP: 122/62  Pulse: 69  Resp: 12  Temp: 97.8 F (36.6 C)  TempSrc: Oral  SpO2: 94%   There is no height or weight on file to calculate BMI.  Physical Exam General: no acute distress Cardiac: hemodynamically stable Extremities: pulsatile right arm AVF  Data: Reviewed previous fistulagram from April 2024. Angioplasty of venous anastomosis with 7mm mustang     Assessment/Plan:   Shannon Caldwell is a 53 y.o. female with ESRD presenting for fistulagram.  Having issues with prolonged bleeding. Last HD session Saturday. Reviewed risks and benefits of fistulagram with intervention and patient agreed to proceed.   Delaney Fearing, MD [facility]

## 2023-11-21 ENCOUNTER — Encounter (HOSPITAL_COMMUNITY): Payer: Self-pay | Admitting: Vascular Surgery

## 2024-07-02 ENCOUNTER — Encounter (HOSPITAL_COMMUNITY): Payer: Self-pay

## 2024-07-05 ENCOUNTER — Other Ambulatory Visit: Payer: Self-pay

## 2024-07-05 ENCOUNTER — Ambulatory Visit (HOSPITAL_COMMUNITY)
Admission: RE | Admit: 2024-07-05 | Discharge: 2024-07-05 | Disposition: A | Discharge status: Home / Self Care | Attending: Vascular Surgery | Admitting: Vascular Surgery

## 2024-07-05 ENCOUNTER — Encounter (HOSPITAL_COMMUNITY)
Admission: RE | Disposition: A | Discharge status: Home / Self Care | Payer: Self-pay | Source: Home / Self Care | Attending: Vascular Surgery

## 2024-07-05 DIAGNOSIS — Z79899 Other long term (current) drug therapy: Secondary | ICD-10-CM | POA: Diagnosis not present

## 2024-07-05 DIAGNOSIS — N186 End stage renal disease: Secondary | ICD-10-CM | POA: Insufficient documentation

## 2024-07-05 DIAGNOSIS — Y832 Surgical operation with anastomosis, bypass or graft as the cause of abnormal reaction of the patient, or of later complication, without mention of misadventure at the time of the procedure: Secondary | ICD-10-CM | POA: Insufficient documentation

## 2024-07-05 DIAGNOSIS — T82858A Stenosis of vascular prosthetic devices, implants and grafts, initial encounter: Secondary | ICD-10-CM | POA: Diagnosis present

## 2024-07-05 DIAGNOSIS — I120 Hypertensive chronic kidney disease with stage 5 chronic kidney disease or end stage renal disease: Secondary | ICD-10-CM | POA: Diagnosis not present

## 2024-07-05 DIAGNOSIS — Z992 Dependence on renal dialysis: Secondary | ICD-10-CM | POA: Diagnosis not present

## 2024-07-05 DIAGNOSIS — E1022 Type 1 diabetes mellitus with diabetic chronic kidney disease: Secondary | ICD-10-CM | POA: Insufficient documentation

## 2024-07-05 MED ORDER — HEPARIN (PORCINE) IN NACL 1000-0.9 UT/500ML-% IV SOLN
INTRAVENOUS | Status: DC | PRN
  Administered 2024-07-05: 500 mL

## 2024-07-05 MED ORDER — LIDOCAINE HCL (PF) 1 % IJ SOLN
INTRAMUSCULAR | Status: AC
  Filled 2024-07-05: qty 30

## 2024-07-05 MED ORDER — LIDOCAINE HCL (PF) 1 % IJ SOLN
INTRAMUSCULAR | Status: DC | PRN
  Administered 2024-07-05: 5 mL

## 2024-07-05 MED ORDER — IODIXANOL 320 MG/ML IV SOLN
INTRAVENOUS | Status: DC | PRN
  Administered 2024-07-05: 35 mL via INTRAVENOUS

## 2024-07-05 NOTE — H&P (Signed)
 " H&P      History of Present Illness: This is a 54 y.o. female with end-stage renal disease that presents for fistulogram due to malfunction of right arm AV graft.  This has undergone prior percutaneous intervention.  States she has had prolonged bleeding for several weeks.  Past Medical History:  Diagnosis Date   Abnormal vaginal bleeding    Anemia associated with chronic renal failure    s/p transfusions   Diabetes mellitus, type 2 (HCC)    changed to type 1   ESRD (end stage renal disease) on dialysis (HCC) 07/07/2020   Generalized headaches    GERD (gastroesophageal reflux disease)    Hyperlipidemia    Hypertension     Past Surgical History:  Procedure Laterality Date   A/V FISTULAGRAM Right 09/30/2022   Procedure: Upper Extremity Angiography;  Surgeon: Magda Debby SAILOR, MD;  Location: [facility] [place] INVASIVE CV LAB;  Service: Cardiovascular;  Laterality: Right;   A/V SHUNT INTERVENTION Right 11/20/2023   Procedure: A/V SHUNT INTERVENTION;  Surgeon: Pearline Norman RAMAN, MD;  Location: [facility] PV LAB;  Service: Cardiovascular;  Laterality: Right;   APPENDECTOMY     AV FISTULA PLACEMENT Right 04/18/2022   Procedure: INSERTION OF ARTERIOVENOUS (AV) GORE-TEX GRAFT RIGHT ARM;  Surgeon: Lanis Fonda BRAVO, MD;  Location: [facility] OR;  Service: Vascular;  Laterality: Right;   CARDIAC CATHETERIZATION     CHOLECYSTECTOMY     cytocyst removal     HERNIA REPAIR  August 2000, August 1998   umbilical hernia    pancreatic debridgement     VENOUS ANGIOPLASTY  11/20/2023   Procedure: VENOUS ANGIOPLASTY;  Surgeon: Pearline Norman RAMAN, MD;  Location: [facility] PV LAB;  Service: Cardiovascular;;    Allergies[1]  Prior to Admission medications  Medication Sig Start Date End Date Taking? Authorizing Provider  albuterol  (VENTOLIN  HFA) 108 (90 Base) MCG/ACT inhaler Inhale 2 puffs into the lungs every 6 (six) hours as needed for wheezing or shortness of breath.    [provider]  atorvastatin  (LIPITOR) 20 MG tablet  Take 20 mg by mouth daily. 03/08/22   [provider]  budesonide -formoterol  (SYMBICORT ) 160-4.5 MCG/ACT inhaler Inhale 2 puffs into the lungs 2 (two) times daily. 03/02/23   Kassie Acquanetta Bradley, MD  calcitRIOL  (ROCALTROL ) 0.5 MCG capsule Take 0.5 mcg by mouth in the morning and at bedtime. 09/21/22   [provider]  diphenhydrAMINE (BENADRYL) 25 mg capsule Take 25 mg by mouth daily as needed for allergies. 12/06/21   [provider]  lidocaine -prilocaine  (EMLA ) cream Apply 1 Application topically.    [provider]  melatonin 3 MG TABS tablet Take 3 mg by mouth at bedtime. Patient not taking: Reported on 02/09/2023 01/23/23   [provider]  Methoxy PEG-Epoetin Beta (MIRCERA) 30 MCG/0.3ML SOSY Inject into the skin. 07/05/21   [provider]  metoprolol  succinate (TOPROL -XL) 50 MG 24 hr tablet Take 50 mg by mouth at bedtime.    [provider]  multivitamin (RENA-VIT) TABS tablet Take 1 tablet by mouth daily.    [provider]  NIFEdipine  (PROCARDIA  XL/NIFEDICAL-XL) 90 MG 24 hr tablet Take 90 mg by mouth daily.    [provider]  ondansetron  (ZOFRAN ) 4 MG tablet Take 4 mg by mouth every 8 (eight) hours as needed for nausea or vomiting.    [provider]  Pancrelipase , Lip-Prot-Amyl, (ZENPEP ) 5000-24000 units CPEP Take 1 capsule by mouth 3 (three) times daily with meals.    [provider]  pantoprazole  (PROTONIX ) 40 MG tablet Take 40 mg by mouth daily.    [provider]  sevelamer  carbonate (RENVELA ) 800 MG tablet Take 800 mg by mouth 3 (three) times daily with meals.    [provider]  spironolactone  (ALDACTONE ) 50 MG tablet Take 50 mg by mouth daily.    [provider]  telmisartan (MICARDIS) 80 MG tablet Take 80 mg by mouth at bedtime. 07/20/21   [provider]  torsemide  (DEMADEX ) 100 MG tablet Take 100 mg by mouth at bedtime. 09/09/20   [provider]   mometasone  (NASONEX ) 50 MCG/ACT nasal spray Place 2 sprays into the nose daily. 01/25/11 08/26/11  Mahlon Comer BRAVO, MD    Social History   Socioeconomic History   Marital status: Single    Spouse name: Not on file   Number of children: 3   Years of education: Not on file   Highest education level: Not on file  Occupational History   Occupation: nursing student    Employer: [employer]   Occupation: med best boy    Comment: [employer] on weekends   Occupation: [employer]    Comment: during week  Tobacco Use   Smoking status: Never   Smokeless tobacco: Never  Vaping Use   Vaping status: Never Used  Substance and Sexual Activity   Alcohol use: No   Drug use: No   Sexual activity: Not on file  Other Topics Concern   Not on file  Social History Narrative   Daughter 62, son 61, son 53   Grandson lives with Patient   Social Drivers of Health   Tobacco Use: Low Risk (07/02/2024)   Received from [facility]   Patient History    Smoking Tobacco Use: Never    Smokeless Tobacco Use: Never    Passive Exposure: Past  Financial Resource Strain: Not on file  Food Insecurity: Low Risk (10/18/2023)   Received from [facility]   Epic    Within the past 12 months, you worried that your food would run out before you got money to buy more: Never true    Within the past 12 months, the food you bought just didn't last and you didn't have money to get more. : Never true  Transportation Needs: No Transportation Needs (10/18/2023)   Received from [facility]    In the past 12 months, has lack of reliable transportation kept you from medical appointments, meetings, work or from getting things needed for daily living? : No  Physical Activity: Not on file  Stress: Not on file  Social Connections: Not on file  Intimate Partner Violence: Not on file  Depression (EYV7-0): Not on file  Alcohol Screen: Not on file  Housing: Low Risk (10/18/2023)    Received from [facility]   Epic    What is your living situation today?: I have a steady place to live    Think about the place you live. Do you have problems with any of the following? Choose all that apply:: None/None on this list  Utilities: Low Risk (10/18/2023)   Received from [facility]   Utilities    In the past 12 months has the electric, gas, oil, or water company threatened to shut off services in your home? : No  Health Literacy: Not on file     Family History  Problem Relation Age of Onset   Hypertension Mother        grandfather   Diabetes  Mother        grandmother   Stroke Other        grandparent   Lung cancer Maternal Grandfather     ROS: [x]  Positive   [ ]  Negative   [ ]  All sytems reviewed and are negative  Cardiovascular: []  chest pain/pressure []  palpitations []  SOB lying flat []  DOE []  pain in legs while walking []  pain in legs at rest []  pain in legs at night []  non-healing ulcers []  hx of DVT []  swelling in legs  Pulmonary: []  productive cough []  asthma/wheezing []  home O2  Neurologic: []  weakness in []  arms []  legs []  numbness in []  arms []  legs []  hx of CVA []  mini stroke [] difficulty speaking or slurred speech []  temporary loss of vision in one eye []  dizziness  Hematologic: []  hx of cancer []  bleeding problems []  problems with blood clotting easily  Endocrine:   []  diabetes []  thyroid  disease  GI []  vomiting blood []  blood in stool  GU: []  CKD/renal failure []  HD--[]  M/W/F or []  T/T/S []  burning with urination []  blood in urine  Psychiatric: []  anxiety []  depression  Musculoskeletal: []  arthritis []  joint pain  Integumentary: []  rashes []  ulcers  Constitutional: []  fever []  chills   Physical Examination  Vitals:   07/05/24 0801 07/05/24 0813  BP: (!) 177/75 (!) 177/75  Pulse: 79 74  Resp: 12 14  Temp: 97.8 F (36.6 C)   SpO2: 91% 94%   There is no height or weight on file to calculate  BMI.  General:  WDWN in NAD Gait: Not observed HENT: WNL, normocephalic Pulmonary: normal non-labored breathing Cardiac: regular, without  Murmurs, rubs or gallops Vascular Exam/Pulses: Right arm AV graft pulsatile  CBC    Component Value Date/Time   WBC 6.9 11/19/2022 0323   RBC 2.32 (L) 11/19/2022 0323   RBC 2.32 (L) 11/19/2022 0323   HGB 7.0 (L) 11/19/2022 0323   HCT 23.1 (L) 11/19/2022 0323   PLT 398 11/19/2022 0323   MCV 99.6 11/19/2022 0323   MCV 83.2 11/22/2011 2025   MCH 30.2 11/19/2022 0323   MCHC 30.3 11/19/2022 0323   RDW 16.4 (H) 11/19/2022 0323   LYMPHSABS 2.3 01/03/2016 2010   MONOABS 0.3 01/03/2016 2010   EOSABS 0.0 01/03/2016 2010   BASOSABS 0.0 01/03/2016 2010    BMET    Component Value Date/Time   NA 138 11/19/2022 0323   K 4.6 11/19/2022 0323   CL 94 (L) 11/19/2022 0323   CO2 21 (L) 11/19/2022 0323   GLUCOSE 100 (H) 11/19/2022 0323   BUN 89 (H) 11/19/2022 0323   CREATININE 14.90 (H) 02/12/2023 1604   CREATININE 0.77 11/22/2011 2022   CALCIUM  9.3 11/19/2022 0323   GFRNONAA 3 (L) 11/19/2022 0323   GFRAA >60 01/03/2016 2010    COAGS: Lab Results  Component Value Date   INR 1.0 04/18/2022     Non-Invasive Vascular Imaging:      ASSESSMENT/PLAN: This is a 54 y.o. female  with end-stage renal disease that presents for fistulogram due to malfunction of right arm AV graft.  This has undergone prior percutaneous intervention.  States this has had prolonged bleeding for several weeks.  Discussed plan for right upper extremity fistulogram with possible intervention including angioplasty and stent.  All questions answered.  Lonni DOROTHA Gaskins, MD [facility] [place] Office: [phone]     [1]  Allergies Allergen Reactions   Amlodipine Shortness Of Breath and Swelling  Octacosanol Shortness Of Breath   Shellfish Protein-Containing Drug Products Itching and Swelling   Latex Itching   Morphine Hives and Itching    "

## 2024-07-05 NOTE — Op Note (Signed)
" ° ° °  Patient name: Shannon Caldwell MRN: [id number] DOB: 02-27-1971 Sex: female  07/05/2024 Pre-operative Diagnosis: Malfunction of right upper arm AV graft with ESRD Post-operative diagnosis:  Same Surgeon:  Lonni DOROTHA Gaskins, MD Procedure Performed: 1.  Ultrasound-guided access right upper arm AV graft 2.  Right upper extremity fistulogram including central venogram 3.  Angioplasty right upper arm AV graft at the venous anastomosis (8 mm x 80 mm Mustang)  Indications: Patient is a 54 year old female with end-stage renal disease using a right upper arm AV graft.  She presents for right upper extremity fistulogram with possible invention after risk-benefits discussed.  Findings: Ultrasound-guided access right upper arm AV graft.  No evidence of central venous stenosis.  There was about a 50% stenosis at the venous anastomosis that was treated with an 8 mm Mustang with no residual stenosis.  No other obvious stenosis in the graft.  Widely patent at completion.   Procedure:  The patient was identified in the holding area and taken to [facility] PV lab.  Placed on the table in supine position.  The right arm was prepped draped standard sterile fashion.  Timeout was performed.  I went ahead and evaluated the right arm AV graft with ultrasound and it was patent and image was saved.  I injected 1% lidocaine  without epinephrine  in the skin over the graft.  The graft was accessed under also guidance with a micro access needle placed a microwire and a microsheath.  Then got upper right extremity fistulogram with central venogram.  Elected for intervention at the venous anastomosis.  Used a Holiday representative and exchanged for a short 6 French sheath.  The lesion was crossed and treated with an 8 mm x 80 mm Mustang to nominal pressure for 2 minutes.  We did get a reflux shot that showed no other stenosis adjacent to the arterial anastomosis.  Much better thrill at completion with no significant residual stenosis.  Wires and  catheters were removed.  We put a pursestring around the sheath and remove this sheath.  Tolerated the procedure with no immediate complications.    Lonni DOROTHA Gaskins, MD [facility] [place] Office: [phone]   "

## 2024-07-06 ENCOUNTER — Encounter (HOSPITAL_COMMUNITY): Payer: Self-pay | Admitting: Vascular Surgery
